# Patient Record
Sex: Female | Born: 1945
Health system: Southern US, Community
[De-identification: ages and names within clinical notes are randomized; demographics above are authoritative.]

## PROBLEM LIST (undated history)

## (undated) DIAGNOSIS — C8330 Diffuse large B-cell lymphoma, unspecified site: Principal | ICD-10-CM

## (undated) DIAGNOSIS — M1990 Unspecified osteoarthritis, unspecified site: Secondary | ICD-10-CM

## (undated) DIAGNOSIS — IMO0001 Reserved for inherently not codable concepts without codable children: Secondary | ICD-10-CM

## (undated) DIAGNOSIS — H9191 Unspecified hearing loss, right ear: Secondary | ICD-10-CM

## (undated) DIAGNOSIS — Z973 Presence of spectacles and contact lenses: Secondary | ICD-10-CM

## (undated) DIAGNOSIS — Z972 Presence of dental prosthetic device (complete) (partial): Secondary | ICD-10-CM

## (undated) DIAGNOSIS — IMO0002 Reserved for concepts with insufficient information to code with codable children: Secondary | ICD-10-CM

## (undated) HISTORY — PX: COLONOSCOPY: SHX174

## (undated) HISTORY — DX: Reserved for concepts with insufficient information to code with codable children: IMO0002

## (undated) HISTORY — PX: TONSILLECTOMY: SUR1361

## (undated) HISTORY — DX: Reserved for inherently not codable concepts without codable children: IMO0001

## (undated) HISTORY — PX: APPENDECTOMY: SHX54

## (undated) HISTORY — PX: ABDOMINAL HYSTERECTOMY: SHX81

## (undated) HISTORY — DX: Diffuse large B-cell lymphoma, unspecified site: C83.30

---

## 1998-02-08 ENCOUNTER — Ambulatory Visit (HOSPITAL_COMMUNITY): Admission: RE | Admit: 1998-02-08 | Discharge: 1998-02-08 | Payer: Self-pay | Admitting: *Deleted

## 1998-06-14 ENCOUNTER — Other Ambulatory Visit: Admission: RE | Admit: 1998-06-14 | Discharge: 1998-06-14 | Payer: Self-pay | Admitting: *Deleted

## 1999-05-13 ENCOUNTER — Ambulatory Visit (HOSPITAL_COMMUNITY): Admission: RE | Admit: 1999-05-13 | Discharge: 1999-05-13 | Payer: Self-pay | Admitting: *Deleted

## 1999-07-01 ENCOUNTER — Other Ambulatory Visit: Admission: RE | Admit: 1999-07-01 | Discharge: 1999-07-01 | Payer: Self-pay | Admitting: *Deleted

## 2000-05-19 ENCOUNTER — Ambulatory Visit (HOSPITAL_COMMUNITY): Admission: RE | Admit: 2000-05-19 | Discharge: 2000-05-19 | Payer: Self-pay | Admitting: *Deleted

## 2000-05-19 ENCOUNTER — Encounter: Payer: Self-pay | Admitting: *Deleted

## 2000-07-24 ENCOUNTER — Ambulatory Visit (HOSPITAL_COMMUNITY): Admission: RE | Admit: 2000-07-24 | Discharge: 2000-07-24 | Payer: Self-pay | Admitting: Gastroenterology

## 2000-07-24 ENCOUNTER — Encounter: Payer: Self-pay | Admitting: Gastroenterology

## 2000-08-10 ENCOUNTER — Other Ambulatory Visit: Admission: RE | Admit: 2000-08-10 | Discharge: 2000-08-10 | Payer: Self-pay | Admitting: *Deleted

## 2001-06-07 ENCOUNTER — Encounter: Payer: Self-pay | Admitting: *Deleted

## 2001-06-07 ENCOUNTER — Ambulatory Visit (HOSPITAL_COMMUNITY): Admission: RE | Admit: 2001-06-07 | Discharge: 2001-06-07 | Payer: Self-pay | Admitting: *Deleted

## 2001-06-16 ENCOUNTER — Encounter: Admission: RE | Admit: 2001-06-16 | Discharge: 2001-06-16 | Payer: Self-pay | Admitting: Internal Medicine

## 2001-06-16 ENCOUNTER — Encounter: Payer: Self-pay | Admitting: Internal Medicine

## 2001-08-09 ENCOUNTER — Other Ambulatory Visit: Admission: RE | Admit: 2001-08-09 | Discharge: 2001-08-09 | Payer: Self-pay | Admitting: *Deleted

## 2002-06-21 ENCOUNTER — Ambulatory Visit (HOSPITAL_COMMUNITY): Admission: RE | Admit: 2002-06-21 | Discharge: 2002-06-21 | Payer: Self-pay | Admitting: *Deleted

## 2002-06-21 ENCOUNTER — Encounter: Payer: Self-pay | Admitting: *Deleted

## 2002-08-29 ENCOUNTER — Other Ambulatory Visit: Admission: RE | Admit: 2002-08-29 | Discharge: 2002-08-29 | Payer: Self-pay | Admitting: *Deleted

## 2003-07-03 ENCOUNTER — Ambulatory Visit (HOSPITAL_COMMUNITY): Admission: RE | Admit: 2003-07-03 | Discharge: 2003-07-03 | Payer: Self-pay | Admitting: *Deleted

## 2003-07-03 ENCOUNTER — Encounter: Payer: Self-pay | Admitting: *Deleted

## 2003-08-03 ENCOUNTER — Ambulatory Visit (HOSPITAL_COMMUNITY): Admission: RE | Admit: 2003-08-03 | Discharge: 2003-08-03 | Payer: Self-pay | Admitting: Gastroenterology

## 2003-09-06 ENCOUNTER — Other Ambulatory Visit: Admission: RE | Admit: 2003-09-06 | Discharge: 2003-09-06 | Payer: Self-pay | Admitting: *Deleted

## 2004-07-08 ENCOUNTER — Ambulatory Visit (HOSPITAL_COMMUNITY): Admission: RE | Admit: 2004-07-08 | Discharge: 2004-07-08 | Payer: Self-pay | Admitting: *Deleted

## 2004-09-16 ENCOUNTER — Other Ambulatory Visit: Admission: RE | Admit: 2004-09-16 | Discharge: 2004-09-16 | Payer: Self-pay | Admitting: *Deleted

## 2005-07-09 ENCOUNTER — Ambulatory Visit (HOSPITAL_COMMUNITY): Admission: RE | Admit: 2005-07-09 | Discharge: 2005-07-09 | Payer: Self-pay | Admitting: *Deleted

## 2005-09-25 ENCOUNTER — Other Ambulatory Visit: Admission: RE | Admit: 2005-09-25 | Discharge: 2005-09-25 | Payer: Self-pay | Admitting: *Deleted

## 2006-03-10 ENCOUNTER — Encounter: Admission: RE | Admit: 2006-03-10 | Discharge: 2006-03-10 | Payer: Self-pay | Admitting: Internal Medicine

## 2006-07-16 ENCOUNTER — Ambulatory Visit (HOSPITAL_COMMUNITY): Admission: RE | Admit: 2006-07-16 | Discharge: 2006-07-16 | Payer: Self-pay | Admitting: *Deleted

## 2006-10-05 ENCOUNTER — Other Ambulatory Visit: Admission: RE | Admit: 2006-10-05 | Discharge: 2006-10-05 | Payer: Self-pay | Admitting: *Deleted

## 2007-07-19 ENCOUNTER — Ambulatory Visit (HOSPITAL_COMMUNITY): Admission: RE | Admit: 2007-07-19 | Discharge: 2007-07-19 | Payer: Self-pay | Admitting: *Deleted

## 2007-08-13 ENCOUNTER — Emergency Department (HOSPITAL_COMMUNITY): Admission: EM | Admit: 2007-08-13 | Discharge: 2007-08-13 | Payer: Self-pay | Admitting: Emergency Medicine

## 2007-10-27 ENCOUNTER — Other Ambulatory Visit: Admission: RE | Admit: 2007-10-27 | Discharge: 2007-10-27 | Payer: Self-pay | Admitting: *Deleted

## 2008-02-07 ENCOUNTER — Ambulatory Visit: Payer: Self-pay | Admitting: Cardiology

## 2008-07-27 ENCOUNTER — Ambulatory Visit (HOSPITAL_COMMUNITY): Admission: RE | Admit: 2008-07-27 | Discharge: 2008-07-27 | Payer: Self-pay | Admitting: *Deleted

## 2008-11-13 ENCOUNTER — Other Ambulatory Visit: Admission: RE | Admit: 2008-11-13 | Discharge: 2008-11-13 | Payer: Self-pay | Admitting: Gynecology

## 2009-03-02 ENCOUNTER — Encounter: Admission: RE | Admit: 2009-03-02 | Discharge: 2009-03-02 | Payer: Self-pay | Admitting: Surgical Oncology

## 2009-04-24 ENCOUNTER — Emergency Department (HOSPITAL_COMMUNITY): Admission: EM | Admit: 2009-04-24 | Discharge: 2009-04-24 | Payer: Self-pay | Admitting: Emergency Medicine

## 2009-08-08 ENCOUNTER — Ambulatory Visit (HOSPITAL_COMMUNITY): Admission: RE | Admit: 2009-08-08 | Discharge: 2009-08-08 | Payer: Self-pay | Admitting: Gynecology

## 2011-05-06 NOTE — Assessment & Plan Note (Signed)
HEALTHCARE                            CARDIOLOGY OFFICE NOTE   NAME:NOBLEErion, Hermans                         MRN:          [id number]  DATE:02/07/2008                            DOB:          Nov 11, 1946    REASON FOR CONSULTATION:  Evaluate patient with bradycardia.   HISTORY OF PRESENT ILLNESS:  The patient is a lovely 65 year old African  American female who looks much younger than her stated age.  She was  seen by Dr. Nash Shearer for questionable syncope versus seizure.  She was at  the nursing home and went down to the ground.  However, she says that  there was water on the floor and that she fell.  She says she did not  lose consciousness.  She knew what was happening.  There was no seizure  activity.  She did not initially have neuro evaluation with a  questionably abnormal EEG.  However, there apparently was no significant  finding and follow-up.  She did apparently have some brady arrhythmia  which I assume was at the time of the EEG.  He had sinus rhythm in the  40s.  I do not have those strips.   The patient is quite active.  She exercises 6 days a week aerobically.  With this, she denies any chest discomfort, neck or arm discomfort.  She  does not have any shortness of breath, PND or orthopnea.  She says she  never felt any palpitations.  She has never had any pre-syncope or  syncope before.   PAST MEDICAL HISTORY:  She has no history of hypertension, diabetes,  hyperlipidemia.   PAST SURGICAL HISTORY:  1. Hysterectomy.  2. Bunionectomy.   ALLERGIES:  BIAXIN, ERYTHROMYCIN, HYDROCODONE.   MEDICATIONS:  Premarin, over-the-counter iron.   SOCIAL HISTORY:  The patient smoked briefly 30 years ago.  She is  married.  She has adult children and  one 90-month-old grandchild.  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   FAMILY HISTORY:  Noncontributory for early coronary artery disease,  sudden cardiac death, syncope,  palpitations or cardiomyopathy.   REVIEW OF SYSTEMS:  As stated in the HPI and positive for glasses,  partial dentures, occasional constipation.  Negative for other systems.   PHYSICAL EXAMINATION:  The patient is in no distress.  Blood pressure  118/70, heart rate 67 and regular, body mass index 28.  HEENT: Eyelids unremarkable. Pupils equal, round, and reactive to light.  Fundi within normal limits. Oral mucosa is unremarkable.  NECK: No jugular venous distention at 45 degrees. Carotid upstroke brisk  and symmetrical. No bruits, no thyromegaly.  LYMPHATICS: No cervical, axillary or inguinal adenopathy.  LUNGS: Clear to auscultation bilaterally.  BACK: No costovertebral angle tenderness.  CHEST: Unremarkable.  HEART: PMI not displaced or sustained. S1, S2 within normal limits. No  S3. No S4. No clicks, rub or murmurs.  ABDOMEN: Flat, positive bowel sounds, normal in frequency and pitch. No  bruits. No rebounds. No guarding. No midline pulsatile mass. No  hepatomegaly, splenomegaly.  SKIN: No rashes, no nodules.  EXTREMITIES:  2+ pulses throughout. No edema. No cyanosis or clubbing.  NEURO: Oriented to person, place and time. Cranial nerves II-XII grossly  intact. Motor grossly intact.   EKG:  Sinus rhythm, rate 65, axis within normal limits, intervals within  normal limits, no acute ST-T wave changes.   ASSESSMENT/PLAN:  1. Bradycardia.  The patient had bradycardia noted.  However, she has      not any symptoms related to this.  She has a heart rate in the 60s      today and describes an increased heart rate with activities.  She      is very aerobically fit, which may explain a brady arrhythmia.      There was no real suggestion of syncope.  Therefore, at this point      no further cardiovascular testing is suggested.  2. Follow-up will be back in this clinic as needed.     Rollene Rotunda, MD, [facility]  Electronically Signed    JH/MedQ  DD: 02/07/2008  DT: 02/07/2008  Job #:  [id number]   cc:   Bevelyn Buckles. Nash Shearer, M.D.

## 2011-05-09 NOTE — Op Note (Signed)
   NAME:  Tabitha Jackson, Tabitha Jackson                            ACCOUNT NO.:  [id number]   MEDICAL RECORD NO.:  [id number]                   PATIENT TYPE:  AMB   LOCATION:  ENDO                                 FACILITY:  [facility]   PHYSICIAN:  Petra Kuba, M.D.                 DATE OF BIRTH:  August 22, 1946   DATE OF PROCEDURE:  08/03/2003  DATE OF DISCHARGE:                                 OPERATIVE REPORT   PROCEDURE:  Colonoscopy.   INDICATION:  Family history of colon cancer, due for repeat screening.  Consent was signed after risks, benefits, methods, options thoroughly  discussed multiple times in the past.   MEDICINES USED:  1. Demerol 60.  2. Versed 6.   DESCRIPTION OF PROCEDURE:  Rectal inspection was negative.  Digital exam was  negative.  Video pediatric adjustable colonoscope was inserted and with mild  difficulty due to tortuosity and looping, was able to be advanced to the  cecum which required rolling her on her back, various abdominal pressures.  No obvious abnormality was seen on insertion.  The cecum was identified by  the appendiceal orifice and the ileocecal valve.  The scope was slowly  withdrawn.  The prep was adequate.  There was some liquid stool that  required washing and suctioning but on slow withdrawal through the colon, no  abnormalities were seen.  When we fell back around a tortuous loop, we did  try to readvance around the curves to decrease chances of missing things.  Anorectal pull-through and retroflexion was normal in the rectum.  Really  did not appreciate any hemorrhoids.  Scope was reinserted a short ways up  the left side of the colon; air was suctioned, scope removed.  The patient  tolerated the procedure well.  There was no obvious immediate complication.   ENDOSCOPIC DIAGNOSIS:  Essentially normal exam to the cecum except for  tortuosity.   PLAN:  1. Yearly rectals and guaiacs per Marcy Salvo C. Lendell Caprice, M.D.  2. Happy to see back p.r.n.  3. Otherwise  repeat screening in five years.                                               Petra Kuba, M.D.    MEM/MEDQ  D:  08/03/2003  T:  08/03/2003  Job:  [id number]   cc:   Janae Bridgeman. Eloise Harman., M.D.  [address]  [place]  [place] [address]  Fax: [phone]

## 2011-10-03 LAB — COMPREHENSIVE METABOLIC PANEL
ALT: 18
AST: 27
Albumin: 3.8
Alkaline Phosphatase: 61
BUN: 9
CO2: 27
Calcium: 9.2
Chloride: 103
Creatinine, Ser: 0.83
GFR calc Af Amer: >60
GFR calc non Af Amer: >60
Glucose, Bld: 75
Potassium: 3.4 — ABNORMAL LOW
Sodium: 138
Total Bilirubin: 0.6
Total Protein: 7.2

## 2011-10-03 LAB — DIFFERENTIAL
Basophils Absolute: 0
Basophils Relative: 0
Eosinophils Absolute: 0.1
Eosinophils Relative: 2
Lymphocytes Relative: 44
Lymphs Abs: 2.1
Monocytes Absolute: 0.2
Monocytes Relative: 4
Neutro Abs: 2.3
Neutrophils Relative %: 49

## 2011-10-03 LAB — CBC
HCT: 39.1
Hemoglobin: 13
MCHC: 33.3
MCV: 88.3
Platelets: 183
RBC: 4.43
RDW: 13.9
WBC: 4.7

## 2013-12-22 HISTORY — PX: EYE SURGERY: SHX253

## 2014-12-17 ENCOUNTER — Encounter: Payer: Self-pay | Admitting: Emergency Medicine

## 2014-12-17 ENCOUNTER — Ambulatory Visit (INDEPENDENT_AMBULATORY_CARE_PROVIDER_SITE_OTHER): Payer: Medicare Other

## 2014-12-17 VITALS — BP 122/68 | HR 56 | Temp 98.0°F | Resp 17 | Ht 65.5 in | Wt 143.0 lb

## 2014-12-17 DIAGNOSIS — R05 Cough: Secondary | ICD-10-CM

## 2014-12-17 DIAGNOSIS — R059 Cough, unspecified: Secondary | ICD-10-CM

## 2014-12-17 DIAGNOSIS — J189 Pneumonia, unspecified organism: Secondary | ICD-10-CM

## 2014-12-17 LAB — POCT CBC
Granulocyte percent: 41.7 %G (ref 37–80)
HCT, POC: 37.6 % — AB (ref 37.7–47.9)
Hemoglobin: 12 g/dL — AB (ref 12.2–16.2)
Lymph, poc: 1.5 (ref 0.6–3.4)
MCH, POC: 28.8 pg (ref 27–31.2)
MCHC: 31.9 g/dL (ref 31.8–35.4)
MCV: 90.2 fL (ref 80–97)
MID (cbc): 0.3 (ref 0–0.9)
MPV: 8.5 fL (ref 0–99.8)
POC Granulocyte: 1.3 — AB (ref 2–6.9)
POC LYMPH PERCENT: 48.4 %L (ref 10–50)
POC MID %: 9.9 %M (ref 0–12)
Platelet Count, POC: 170 10*3/uL (ref 142–424)
RBC: 4.17 M/uL (ref 4.04–5.48)
RDW, POC: 13.9 %
WBC: 3.1 10*3/uL — AB (ref 4.6–10.2)

## 2014-12-17 LAB — POCT INFLUENZA A/B
Influenza A, POC: NEGATIVE
Influenza B, POC: NEGATIVE

## 2014-12-17 MED ORDER — LEVOFLOXACIN 500 MG PO TABS: 500.0000 mg | ORAL_TABLET | Freq: Every day | ORAL | Status: AC

## 2014-12-17 NOTE — Patient Instructions (Signed)

## 2014-12-17 NOTE — Progress Notes (Addendum)
Subjective:  This chart was scribed for Tabitha Jackson by Starleen Arms, Medical Scribe. This patient was seen in room Rm 11 and the patient's care was started at 8:45 AM.   Patient ID: Tabitha Jackson, female    DOB: 06-10-1946, 68 y.o.   MRN: [id number]  HPI HPI Comments: EMONY Jackson is a 68 y.o. female who presents to the [facility] complaining of a cough productive of thick, yellowish sputum in variable amounts onset 12/22 days ago.  She states the symptoms "started in my head and moved to my chest".  Patient has used green tea and alka seltzer.  Patient reports she has had a flu shot this year. Patient denies history of asthma, lung conditions.  Patient is a former smoker 30 years ago.  Patient denies fever, chills.  Patient reports an allergy to amicin, Biaxin, hydrocodone.    Review of Systems  Constitutional: Negative for fever and chills.  Respiratory: Positive for cough.        Objective:   Physical Exam  Constitutional: She is oriented to person, place, and time. She appears well-developed and well-nourished. No distress.  HENT:  Head: Normocephalic and atraumatic.  Clear nasal drainage.   Eyes: Conjunctivae and EOM are normal.  Neck: Neck supple. No tracheal deviation present.  Cardiovascular: Normal rate, regular rhythm and normal heart sounds.  Exam reveals no gallop and no friction rub.   No murmur heard. Pulmonary/Chest: Effort normal and breath sounds normal. No respiratory distress. She has no wheezes. She has no rales. She exhibits no tenderness.  CTA  Musculoskeletal: Normal range of motion.  Neurological: She is alert and oriented to person, place, and time.  Skin: Skin is warm and dry.  Psychiatric: She has a normal mood and affect. Her behavior is normal.  Nursing note and vitals reviewed.   8:50 AM Will order CXR and labs.  Patient acknowledges and agrees with plan.   Results for orders placed or performed in visit on 12/17/14  POCT CBC  Result Value Ref Range   WBC 3.1 (A) 4.6 - 10.2 K/uL   Lymph, poc 1.5 0.6 - 3.4   POC LYMPH PERCENT 48.4 10 - 50 %L   MID (cbc) 0.3 0 - 0.9   POC MID % 9.9 0 - 12 %M   POC Granulocyte 1.3 (A) 2 - 6.9   Granulocyte percent 41.7 37 - 80 %G   RBC 4.17 4.04 - 5.48 M/uL   Hemoglobin 12.0 (A) 12.2 - 16.2 g/dL   HCT, POC 37.6 (A) 37.7 - 47.9 %   MCV 90.2 80 - 97 fL   MCH, POC 28.8 27 - 31.2 pg   MCHC 31.9 31.8 - 35.4 g/dL   RDW, POC 13.9 %   Platelet Count, POC 170 142 - 424 K/uL   MPV 8.5 0 - 99.8 fL  [facility] reading (PRIMARY) by  Dr. Everlene Farrier there is a patchy right middle lobe infiltrate I received the radiologist report and he feels like the pneumonia is in the right lower lobe with consolidation Results for orders placed or performed in visit on 12/17/14  POCT CBC  Result Value Ref Range   WBC 3.1 (A) 4.6 - 10.2 K/uL   Lymph, poc 1.5 0.6 - 3.4   POC LYMPH PERCENT 48.4 10 - 50 %L   MID (cbc) 0.3 0 - 0.9   POC MID % 9.9 0 - 12 %M   POC Granulocyte 1.3 (A) 2 - 6.9   Granulocyte  percent 41.7 37 - 80 %G   RBC 4.17 4.04 - 5.48 M/uL   Hemoglobin 12.0 (A) 12.2 - 16.2 g/dL   HCT, POC 37.6 (A) 37.7 - 47.9 %   MCV 90.2 80 - 97 fL   MCH, POC 28.8 27 - 31.2 pg   MCHC 31.9 31.8 - 35.4 g/dL   RDW, POC 13.9 %   Platelet Count, POC 170 142 - 424 K/uL   MPV 8.5 0 - 99.8 fL  POCT Influenza A/B  Result Value Ref Range   Influenza A, POC Negative    Influenza B, POC Negative       Assessment & Plan:   Patient has a right lower lobe infiltrate. Will treat with levaquin  and Mucinex and fluids. She will need a follow-up film in 10 days to show clearing. Definitely this could be viral related since patient's white count is 3100. We'll recheck in  72 hours. She will return tomorrow if worsening otherwise recheck Wednesday. She will need a repeat white count since her white count is low at 3100

## 2014-12-20 ENCOUNTER — Ambulatory Visit (INDEPENDENT_AMBULATORY_CARE_PROVIDER_SITE_OTHER): Payer: Medicare Other | Admitting: Emergency Medicine

## 2014-12-20 ENCOUNTER — Ambulatory Visit (INDEPENDENT_AMBULATORY_CARE_PROVIDER_SITE_OTHER): Payer: Medicare Other

## 2014-12-20 ENCOUNTER — Encounter: Payer: Self-pay | Admitting: Emergency Medicine

## 2014-12-20 VITALS — BP 120/78 | HR 74 | Temp 97.7°F | Resp 18 | Ht 65.25 in | Wt 143.0 lb

## 2014-12-20 DIAGNOSIS — R05 Cough: Secondary | ICD-10-CM

## 2014-12-20 DIAGNOSIS — R059 Cough, unspecified: Secondary | ICD-10-CM

## 2014-12-20 DIAGNOSIS — J189 Pneumonia, unspecified organism: Secondary | ICD-10-CM

## 2014-12-20 LAB — POCT CBC
Granulocyte percent: 45.1 %G (ref 37–80)
HCT, POC: 39.9 % (ref 37.7–47.9)
Hemoglobin: 12.8 g/dL (ref 12.2–16.2)
Lymph, poc: 1.6 (ref 0.6–3.4)
MCH, POC: 28.7 pg (ref 27–31.2)
MCHC: 32.1 g/dL (ref 31.8–35.4)
MCV: 89.4 fL (ref 80–97)
MID (cbc): 0.2 (ref 0–0.9)
MPV: 8.7 fL (ref 0–99.8)
POC Granulocyte: 1.4 — AB (ref 2–6.9)
POC LYMPH PERCENT: 49 %L (ref 10–50)
POC MID %: 5.9 %M (ref 0–12)
Platelet Count, POC: 201 10*3/uL (ref 142–424)
RBC: 4.47 M/uL (ref 4.04–5.48)
RDW, POC: 13.5 %
WBC: 3.2 10*3/uL — AB (ref 4.6–10.2)

## 2014-12-20 NOTE — Patient Instructions (Signed)

## 2014-12-20 NOTE — Progress Notes (Addendum)
   Subjective:    Patient ID: Tabitha Jackson, female    DOB: 04/28/1946, 68 y.o.   MRN: [id number]  This chart was scribed for Jenny Reichmann, MD, by Stephania Fragmin, ED Scribe. This patient was seen in room 3 and the patient's care was started at 8:26 AM.   HPI  HPI Comments: Tabitha Jackson is a 68 y.o. female who presents to the [facility] for a recheck of pneumonia. Patient was seen here 3 days ago, where I diagnosed her with pneumonia and prescribed levofloxacin. She reports she has been taking her medication daily, and had plenty of rest and fluid. She states she has still been coughing, although her sputum has lightened from thick yellow towards a clearer white. She denies SOB. Patient thinks she may have caught her illness at the gym, as she swims 71 laps 6 days a week.    Review of Systems  Constitutional: Negative for fever.  Respiratory: Positive for cough. Negative for shortness of breath.   Psychiatric/Behavioral: Negative for sleep disturbance.       Objective:   Physical Exam  CONSTITUTIONAL: Well developed/well nourished HEAD: Normocephalic/atraumatic EYES: EOMI/PERRL ENMT: Mucous membranes moist NECK: supple no meningeal signs SPINE/BACK:entire spine nontender CV: S1/S2 noted, no murmurs/rubs/gallops noted LUNGS: Rhonchi noted in right lower lobe and left lingular area ABDOMEN: soft, nontender, no rebound or guarding, bowel sounds noted throughout abdomen GU:no cva tenderness NEURO: Pt is awake/alert/appropriate, moves all extremitiesx4.  No facial droop.   EXTREMITIES: pulses normal/equal, full ROM SKIN: warm, color normal PSYCH: no abnormalities of mood noted, alert and oriented to situation   Results for orders placed or performed in visit on 12/20/14  POCT CBC  Result Value Ref Range   WBC 3.2 (A) 4.6 - 10.2 K/uL   Lymph, poc 1.6 0.6 - 3.4   POC LYMPH PERCENT 49.0 10 - 50 %L   MID (cbc) 0.2 0 - 0.9   POC MID % 5.9 0 - 12 %M   POC Granulocyte  1.4 (A) 2 - 6.9   Granulocyte percent 45.1 37 - 80 %G   RBC 4.47 4.04 - 5.48 M/uL   Hemoglobin 12.8 12.2 - 16.2 g/dL   HCT, POC 39.9 37.7 - 47.9 %   MCV 89.4 80 - 97 fL   MCH, POC 28.7 27 - 31.2 pg   MCHC 32.1 31.8 - 35.4 g/dL   RDW, POC 13.5 %   Platelet Count, POC 201 142 - 424 K/uL   MPV 8.7 0 - 99.8 fL    [facility] reading (PRIMARY) by  Dr. Everlene Farrier there has been progressive consolidation in the right lower lobe.      Assessment & Plan:  Patient has progressive consolidation in the right lower lobe. Her white count remains low at 3200. She will continue her current antibiotics and recheck on Monday. She was giving the warning signs for progression to go to the ER she had problems. Otherwise recheck on Monday.  I personally performed the services described in this documentation, which was scribed in my presence. The recorded information has been reviewed and is accurate.

## 2014-12-25 ENCOUNTER — Ambulatory Visit (INDEPENDENT_AMBULATORY_CARE_PROVIDER_SITE_OTHER): Payer: Medicare Other

## 2014-12-25 ENCOUNTER — Ambulatory Visit (INDEPENDENT_AMBULATORY_CARE_PROVIDER_SITE_OTHER): Payer: Medicare Other | Admitting: Emergency Medicine

## 2014-12-25 VITALS — BP 118/62 | HR 65 | Temp 97.9°F | Resp 18 | Ht 66.25 in | Wt 144.2 lb

## 2014-12-25 DIAGNOSIS — J189 Pneumonia, unspecified organism: Secondary | ICD-10-CM

## 2014-12-25 DIAGNOSIS — R591 Generalized enlarged lymph nodes: Secondary | ICD-10-CM | POA: Diagnosis not present

## 2014-12-25 NOTE — Progress Notes (Signed)
Subjective:  This chart was scribed for Tabitha Jordan, MD by Dellis Filbert, ED Scribe at [facility].The patient was seen in exam room 05 and the patient's care was started at 8:38 AM.   Patient ID: Tabitha Jackson, female    DOB: 09/15/1946, 69 y.o.   MRN: [id number] Chief Complaint  Patient presents with  . Follow-up    for cough and pneumonia. Pt. says that she noticed a tender lump behind her right ear yesterday afternoon.     HPI HPI Comments: ALAYSSA FLINCHUM is a 69 y.o. female who presents to [facility] here for a follow up for pneumonia. Pt still has an ongoing cough that is productive but the sputum is more clear than yellow. She denies fever, chills, and loss of appetite. Pt is frustrated because she has been unable to swim. Pt also ooticed a lump under her right ear, onset yesterday. Her husband gave her a cough syrup with a hydrocodone. Pt is allergic to the to hydrocodone.  Patient Active Problem List   Diagnosis Date Noted  . CAP (community acquired pneumonia) 12/20/2014   History reviewed. No pertinent past medical history. Past Surgical History  Procedure Laterality Date  . Appendectomy    . Eye surgery    . Abdominal hysterectomy     Allergies  Allergen Reactions  . Erythromycin Swelling  . Hydrocodone Swelling  . Biaxin [Clarithromycin] Rash   Prior to Admission medications   Medication Sig Start Date End Date Taking? Authorizing Provider  Cinnamon 500 MG TABS Take by mouth.   Yes Historical Provider, MD  levofloxacin (LEVAQUIN) 500 MG tablet Take 1 tablet (500 mg total) by mouth daily. 12/17/14 12/27/14 Yes [name], MD  naproxen (NAPROSYN) 250 MG tablet Take by mouth 2 (two) times daily with a meal.   Yes Historical Provider, MD   History   Social History  . Marital Status: Married    Spouse Name: N/A    Number of Children: N/A  . Years of Education: N/A   Occupational History  . Not on file.   Social History Main Topics  . Smoking  status: Never Smoker   . Smokeless tobacco: Not on file  . Alcohol Use: No  . Drug Use: No  . Sexual Activity: No   Other Topics Concern  . Not on file   Social History Narrative   Review of Systems  Constitutional: Negative for fever, chills and appetite change.  Respiratory: Positive for cough.       Objective:  BP 118/62 mmHg  Pulse 65  Temp(Src) 97.9 F (36.6 C) (Oral)  Resp 18  Ht 5' 6.25" (1.683 m)  Wt 144 lb 3.2 oz (65.409 kg)  BMI 23.09 kg/m2  SpO2 98%  Physical Exam  Constitutional: She is oriented to person, place, and time. She appears well-developed and well-nourished. No distress.  HENT:  Head: Normocephalic and atraumatic.  Eyes: EOM are normal.  Neck: Normal range of motion.  1/1 cm posterior cervical node. 1/1 cm anterior cervical node.  Cardiovascular: Normal rate.   Pulmonary/Chest: Effort normal.  Consolidated right lower lobe pneumonia. Few faint rales right base.  Neurological: She is alert and oriented to person, place, and time. No cranial nerve deficit. She exhibits normal muscle tone. Coordination normal.  Skin: Skin is warm and dry.  Psychiatric: She has a normal mood and affect. Her behavior is normal.  Nursing note and vitals reviewed. [facility] reading (PRIMARY) by  Dr.Daub  there is a resolving infiltrate in the right lower lobe. This area appears to be about 50% clear.     Assessment & Plan:  Pt here for follow up for pneumonia will proceed with chest x-ray. Chest x-ray is improving. She is to finish her medications. I will allow her to start back to do some exercise at about 30% of what she normally does. We'll recheck the patient in 2 weeks. She needs to have the 2 nodes rechecked in her neck and a repeat chest x-ray.I personally performed the services described in this documentation, which was scribed in my presence. The recorded information has been reviewed and is accurate.

## 2014-12-26 DIAGNOSIS — R599 Enlarged lymph nodes, unspecified: Secondary | ICD-10-CM | POA: Diagnosis not present

## 2015-01-04 DIAGNOSIS — R221 Localized swelling, mass and lump, neck: Secondary | ICD-10-CM | POA: Diagnosis not present

## 2015-01-08 ENCOUNTER — Ambulatory Visit (INDEPENDENT_AMBULATORY_CARE_PROVIDER_SITE_OTHER): Payer: Medicare Other

## 2015-01-08 ENCOUNTER — Ambulatory Visit (INDEPENDENT_AMBULATORY_CARE_PROVIDER_SITE_OTHER): Payer: Medicare Other | Admitting: Emergency Medicine

## 2015-01-08 VITALS — BP 116/68 | HR 98 | Temp 98.0°F | Resp 18 | Ht 66.25 in | Wt 144.0 lb

## 2015-01-08 DIAGNOSIS — R591 Generalized enlarged lymph nodes: Secondary | ICD-10-CM

## 2015-01-08 DIAGNOSIS — J189 Pneumonia, unspecified organism: Secondary | ICD-10-CM

## 2015-01-08 LAB — POCT CBC
Granulocyte percent: 39.7 %G (ref 37–80)
HCT, POC: 39.2 % (ref 37.7–47.9)
Hemoglobin: 12.6 g/dL (ref 12.2–16.2)
Lymph, poc: 1.5 (ref 0.6–3.4)
MCH, POC: 28.5 pg (ref 27–31.2)
MCHC: 32.1 g/dL (ref 31.8–35.4)
MCV: 88.8 fL (ref 80–97)
MID (cbc): 0.2 (ref 0–0.9)
MPV: 8.8 fL (ref 0–99.8)
POC Granulocyte: 1.1 — AB (ref 2–6.9)
POC LYMPH PERCENT: 53.5 %L — AB (ref 10–50)
POC MID %: 6.8 %M (ref 0–12)
Platelet Count, POC: 174 10*3/uL (ref 142–424)
RBC: 4.41 M/uL (ref 4.04–5.48)
RDW, POC: 13.7 %
WBC: 2.8 10*3/uL — AB (ref 4.6–10.2)

## 2015-01-08 LAB — BASIC METABOLIC PANEL
BUN: 9 mg/dL (ref 6–23)
CO2: 32 mEq/L (ref 19–32)
Calcium: 9.6 mg/dL (ref 8.4–10.5)
Chloride: 101 mEq/L (ref 96–112)
Creat: 0.65 mg/dL (ref 0.50–1.10)
Glucose, Bld: 78 mg/dL (ref 70–99)
Potassium: 4.9 mEq/L (ref 3.5–5.3)
Sodium: 139 mEq/L (ref 135–145)

## 2015-01-08 LAB — POCT SEDIMENTATION RATE: POCT SED RATE: 27 mm/hr — AB (ref 0–22)

## 2015-01-08 NOTE — Progress Notes (Addendum)
   Subjective:    Patient ID: Tabitha Jackson, female    DOB: 02-07-1946, 68 y.o.   MRN: [id number] This chart was scribed for Arlyss Queen, MD by Marti Sleigh, Medical Scribe. This patient was seen in Room 1 and the patient's care was started at 9:33 AM.  Chief Complaint  Patient presents with  . Follow-up    pneumonia     HPI HPI Comments: Tabitha Jackson is a 70 y.o. female who presents to [facility] reporting for a pneumonia follow up. Pt has reported several times to [facility] for follow up since 12/22 when she reported for a severe productive cough with thick green yellow-green sputum. Pt states she is doing well. She endorses mild persistent cough. Pt states the lump on her throat has persisted, even as her other sx have resolved.     Review of Systems  Constitutional: Negative for fever and chills.  HENT: Positive for congestion (Mild).   Respiratory: Positive for cough (Mild).   Psychiatric/Behavioral: Negative for sleep disturbance.       Objective:   Physical Exam  Constitutional: She is oriented to person, place, and time. She appears well-developed and well-nourished.  HENT:  Head: Normocephalic and atraumatic.  Eyes: Pupils are equal, round, and reactive to light.  Neck: Neck supple.  Non distinct right posterior cervical node, and a 1/1.5cm right posterior cervical node.  Cardiovascular: Normal rate and regular rhythm.   Pulmonary/Chest: Effort normal and breath sounds normal. No respiratory distress. She has no wheezes.  Neurological: She is alert and oriented to person, place, and time.  Skin: Skin is warm and dry.  Psychiatric: She has a normal mood and affect. Her behavior is normal.  Nursing note and vitals reviewed. [facility] reading (PRIMARY) by  Dr. Everlene Farrier the previously noted right lower lobe pneumonia has now resolved.      Assessment & Plan:   I am very concerned about the two lymph nodes in her neck. They have not decreased in size with treatment of her pneumonia. She  will need evaluation by dr Benjamine Mola, ENT and imaging by CT. Will do a follow up chest x-ray today to check the pneumonia.I personally performed the services described in this documentation, which was scribed in my presence. The recorded information has been reviewed and is accurate.

## 2015-01-15 DIAGNOSIS — R590 Localized enlarged lymph nodes: Secondary | ICD-10-CM | POA: Diagnosis not present

## 2015-01-15 DIAGNOSIS — R070 Pain in throat: Secondary | ICD-10-CM | POA: Diagnosis not present

## 2015-01-15 DIAGNOSIS — R221 Localized swelling, mass and lump, neck: Secondary | ICD-10-CM | POA: Diagnosis not present

## 2015-01-30 DIAGNOSIS — D487 Neoplasm of uncertain behavior of other specified sites: Secondary | ICD-10-CM | POA: Diagnosis not present

## 2015-01-30 DIAGNOSIS — H6983 Other specified disorders of Eustachian tube, bilateral: Secondary | ICD-10-CM | POA: Diagnosis not present

## 2015-01-30 DIAGNOSIS — J310 Chronic rhinitis: Secondary | ICD-10-CM | POA: Diagnosis not present

## 2015-01-30 DIAGNOSIS — J343 Hypertrophy of nasal turbinates: Secondary | ICD-10-CM | POA: Diagnosis not present

## 2015-01-30 DIAGNOSIS — H6981 Other specified disorders of Eustachian tube, right ear: Secondary | ICD-10-CM | POA: Diagnosis not present

## 2015-01-30 DIAGNOSIS — H9011 Conductive hearing loss, unilateral, right ear, with unrestricted hearing on the contralateral side: Secondary | ICD-10-CM | POA: Diagnosis not present

## 2015-01-31 ENCOUNTER — Ambulatory Visit
Admission: RE | Admit: 2015-01-31 | Discharge: 2015-01-31 | Disposition: A | Discharge status: Home / Self Care | Payer: Medicare Other | Source: Ambulatory Visit | Attending: Emergency Medicine | Admitting: Emergency Medicine

## 2015-01-31 DIAGNOSIS — R591 Generalized enlarged lymph nodes: Secondary | ICD-10-CM

## 2015-01-31 DIAGNOSIS — C090 Malignant neoplasm of tonsillar fossa: Secondary | ICD-10-CM | POA: Diagnosis not present

## 2015-01-31 MED ORDER — IOHEXOL 300 MG/ML  SOLN
75.0000 mL | Freq: Once | INTRAMUSCULAR | Status: AC | PRN
  Administered 2015-01-31: 75 mL via INTRAVENOUS

## 2015-02-01 ENCOUNTER — Encounter: Payer: Self-pay | Admitting: Emergency Medicine

## 2015-02-01 ENCOUNTER — Ambulatory Visit (INDEPENDENT_AMBULATORY_CARE_PROVIDER_SITE_OTHER): Payer: Medicare Other | Admitting: Emergency Medicine

## 2015-02-01 VITALS — BP 110/66 | HR 84 | Temp 98.2°F | Resp 20 | Ht 65.5 in | Wt 143.6 lb

## 2015-02-01 DIAGNOSIS — R911 Solitary pulmonary nodule: Secondary | ICD-10-CM | POA: Diagnosis not present

## 2015-02-01 DIAGNOSIS — R221 Localized swelling, mass and lump, neck: Secondary | ICD-10-CM | POA: Diagnosis not present

## 2015-02-01 NOTE — Progress Notes (Signed)
Subjective:  This chart was scribed for Tabitha Russian, MD by Ian Bushman, ED Scribe. This patient was seen in room 23 and the patient's care was started at 12:32 PM.    Patient ID: Tabitha Jackson, female    DOB: Jul 22, 1946, 69 y.o.   MRN: [id number]  HPI  HPI Comments: Tabitha Jackson is a 69 y.o. female who presents [facility] for a follow up regarding her CT results of her tonsils/throat.  Patient notes that she will see Dr.Theo tomorrow at 1pm. Patient will have the blockage sensation in her ear looked at tomorrow. Dr. Benjamine Mola states that when he looked at her tonsils, he could not find anything visible on her tonsils. She will see Dr. Maudie Mercury will be seen for lab work on the 22nd of February.  Patient notes that she is a Tuberculosis carrier. Patient used to smoke over 30 years ago.  Patient notes that she is constantly active and exercizes.   Patient notes that her brother had lukemia.     Patient Active Problem List   Diagnosis Date Noted  . Lymphadenopathy 12/25/2014  . CAP (community acquired pneumonia) 12/20/2014   No past medical history on file. Past Surgical History  Procedure Laterality Date  . Appendectomy    . Eye surgery    . Abdominal hysterectomy     Allergies  Allergen Reactions  . Erythromycin Swelling  . Hydrocodone Swelling  . Biaxin [Clarithromycin] Rash   Prior to Admission medications   Medication Sig Start Date End Date Taking? Authorizing Provider  Cinnamon 500 MG TABS Take by mouth.    Historical Provider, MD  Ferrous Sulfate (IRON) 325 (65 FE) MG TABS Take by mouth.    Historical Provider, MD  naproxen (NAPROSYN) 250 MG tablet Take by mouth 2 (two) times daily with a meal.    Historical Provider, MD   History   Social History  . Marital Status: Married    Spouse Name: N/A  . Number of Children: N/A  . Years of Education: N/A   Occupational History  . Not on file.   Social History Main Topics  . Smoking status: Never Smoker     . Smokeless tobacco: Not on file  . Alcohol Use: No  . Drug Use: No  . Sexual Activity: No   Other Topics Concern  . Not on file   Social History Narrative    Review of Systems  Constitutional: Negative for activity change.  Respiratory: Negative for shortness of breath.   Cardiovascular: Negative for chest pain.       Objective:   Physical Exam CONSTITUTIONAL: Well developed/well nourished HEAD: Normocephalic/atraumatic EYES: EOMI/PERRL ENMT: Mucous membranes moist there are 31-2 cm nodes right posterior cervical chain. Examination of the throat reveals some enlargement on the right side of the tonsillar area compared to the left. Her chest was clear NECK: supple no meningeal signs SPINE/BACK:entire spine nontender CV: S1/S2 noted, no murmurs/rubs/gallops noted LUNGS: Lungs are clear to auscultation bilaterally, no apparent distress ABDOMEN: soft, nontender, no rebound or guarding, bowel sounds noted throughout abdomen GU:no cva tenderness NEURO: Pt is awake/alert/appropriate, moves all extremitiesx4.  No facial droop.   EXTREMITIES: pulses normal/equal, full ROM SKIN: warm, color normal PSYCH: no abnormalities of mood noted, alert and oriented to situation  Filed Vitals:   02/01/15 1226  BP: 110/66  Pulse: 84  Temp: 98.2 F (36.8 C)  TempSrc: Oral  Resp: 20  Height: 5' 5.5" (1.664 m)  Weight: 143 lb 9.6 oz (65.137 kg)  SpO2: 100%        Assessment & Plan:  We'll proceed with CT of the chest. She is scheduled to see Dr. Yvette Rack tomorrow to proceed with biopsy of this abnormal area.I personally performed the services described in this documentation, which was scribed in my presence. The recorded information has been reviewed and is accurate.

## 2015-02-02 ENCOUNTER — Other Ambulatory Visit: Payer: Self-pay | Admitting: Otolaryngology

## 2015-02-02 ENCOUNTER — Encounter (HOSPITAL_BASED_OUTPATIENT_CLINIC_OR_DEPARTMENT_OTHER): Payer: Self-pay | Admitting: *Deleted

## 2015-02-02 DIAGNOSIS — D37030 Neoplasm of uncertain behavior of the parotid salivary glands: Secondary | ICD-10-CM | POA: Diagnosis not present

## 2015-02-02 DIAGNOSIS — D487 Neoplasm of uncertain behavior of other specified sites: Secondary | ICD-10-CM | POA: Diagnosis not present

## 2015-02-02 NOTE — Progress Notes (Signed)
No labs needed

## 2015-02-05 ENCOUNTER — Ambulatory Visit (HOSPITAL_BASED_OUTPATIENT_CLINIC_OR_DEPARTMENT_OTHER)
Admission: RE | Admit: 2015-02-05 | Discharge: 2015-02-05 | Disposition: A | Discharge status: Home / Self Care | Payer: Medicare Other | Source: Ambulatory Visit | Attending: Otolaryngology | Admitting: Otolaryngology

## 2015-02-05 ENCOUNTER — Encounter (HOSPITAL_BASED_OUTPATIENT_CLINIC_OR_DEPARTMENT_OTHER): Payer: Self-pay

## 2015-02-05 ENCOUNTER — Ambulatory Visit (HOSPITAL_BASED_OUTPATIENT_CLINIC_OR_DEPARTMENT_OTHER): Payer: Medicare Other | Admitting: Anesthesiology

## 2015-02-05 ENCOUNTER — Encounter (HOSPITAL_BASED_OUTPATIENT_CLINIC_OR_DEPARTMENT_OTHER)
Admission: RE | Disposition: A | Discharge status: Home / Self Care | Payer: Self-pay | Source: Ambulatory Visit | Attending: Otolaryngology

## 2015-02-05 DIAGNOSIS — H748X1 Other specified disorders of right middle ear and mastoid: Secondary | ICD-10-CM | POA: Diagnosis not present

## 2015-02-05 DIAGNOSIS — H6981 Other specified disorders of Eustachian tube, right ear: Secondary | ICD-10-CM | POA: Diagnosis not present

## 2015-02-05 DIAGNOSIS — C8331 Diffuse large B-cell lymphoma, lymph nodes of head, face, and neck: Secondary | ICD-10-CM | POA: Diagnosis not present

## 2015-02-05 DIAGNOSIS — H6521 Chronic serous otitis media, right ear: Secondary | ICD-10-CM | POA: Diagnosis not present

## 2015-02-05 DIAGNOSIS — H9011 Conductive hearing loss, unilateral, right ear, with unrestricted hearing on the contralateral side: Secondary | ICD-10-CM | POA: Insufficient documentation

## 2015-02-05 DIAGNOSIS — R590 Localized enlarged lymph nodes: Secondary | ICD-10-CM | POA: Diagnosis not present

## 2015-02-05 DIAGNOSIS — R221 Localized swelling, mass and lump, neck: Secondary | ICD-10-CM | POA: Diagnosis not present

## 2015-02-05 HISTORY — PX: MYRINGOTOMY WITH TUBE PLACEMENT: SHX5663

## 2015-02-05 HISTORY — PX: MASS EXCISION: SHX2000

## 2015-02-05 HISTORY — DX: Presence of dental prosthetic device (complete) (partial): Z97.2

## 2015-02-05 HISTORY — DX: Unspecified osteoarthritis, unspecified site: M19.90

## 2015-02-05 HISTORY — DX: Unspecified hearing loss, right ear: H91.91

## 2015-02-05 HISTORY — DX: Presence of spectacles and contact lenses: Z97.3

## 2015-02-05 SURGERY — EXCISION MASS
Anesthesia: General | Site: Neck | Laterality: Right

## 2015-02-05 MED ORDER — HYDROMORPHONE HCL 1 MG/ML IJ SOLN
0.2500 mg | INTRAMUSCULAR | Status: DC | PRN
  Administered 2015-02-05: 0.25 mg via INTRAVENOUS
  Administered 2015-02-05: 0.5 mg via INTRAVENOUS

## 2015-02-05 MED ORDER — LACTATED RINGERS IV SOLN
INTRAVENOUS | Status: DC
  Administered 2015-02-05: 10 mL/h via INTRAVENOUS
  Administered 2015-02-05: 10:00:00 via INTRAVENOUS

## 2015-02-05 MED ORDER — DEXAMETHASONE SODIUM PHOSPHATE 4 MG/ML IJ SOLN
INTRAMUSCULAR | Status: DC | PRN
  Administered 2015-02-05: 10 mg via INTRAVENOUS

## 2015-02-05 MED ORDER — OXYCODONE-ACETAMINOPHEN 5-325 MG PO TABS: 1.0000 | ORAL_TABLET | ORAL | Status: DC | PRN

## 2015-02-05 MED ORDER — FENTANYL CITRATE 0.05 MG/ML IJ SOLN
INTRAMUSCULAR | Status: DC | PRN
  Administered 2015-02-05: 100 ug via INTRAVENOUS
  Administered 2015-02-05: 50 ug via INTRAVENOUS

## 2015-02-05 MED ORDER — MIDAZOLAM HCL 2 MG/2ML IJ SOLN
INTRAMUSCULAR | Status: AC
  Filled 2015-02-05: qty 2

## 2015-02-05 MED ORDER — ONDANSETRON HCL 4 MG/2ML IJ SOLN: 4.0000 mg | Freq: Once | INTRAMUSCULAR | Status: DC | PRN

## 2015-02-05 MED ORDER — OXYCODONE HCL 5 MG/5ML PO SOLN: 5.0000 mg | Freq: Once | ORAL | Status: DC | PRN

## 2015-02-05 MED ORDER — OXYCODONE HCL 5 MG PO TABS: 5.0000 mg | ORAL_TABLET | Freq: Once | ORAL | Status: DC | PRN

## 2015-02-05 MED ORDER — MIDAZOLAM HCL 5 MG/5ML IJ SOLN
INTRAMUSCULAR | Status: DC | PRN
  Administered 2015-02-05 (×2): 2 mg via INTRAVENOUS

## 2015-02-05 MED ORDER — FENTANYL CITRATE 0.05 MG/ML IJ SOLN: 50.0000 ug | INTRAMUSCULAR | Status: DC | PRN

## 2015-02-05 MED ORDER — OXYMETAZOLINE HCL 0.05 % NA SOLN
NASAL | Status: AC
  Filled 2015-02-05: qty 15

## 2015-02-05 MED ORDER — FENTANYL CITRATE 0.05 MG/ML IJ SOLN
INTRAMUSCULAR | Status: AC
  Filled 2015-02-05: qty 6

## 2015-02-05 MED ORDER — LIDOCAINE-EPINEPHRINE 1 %-1:100000 IJ SOLN
INTRAMUSCULAR | Status: DC | PRN
  Administered 2015-02-05: 1 mL

## 2015-02-05 MED ORDER — ONDANSETRON HCL 4 MG/2ML IJ SOLN
INTRAMUSCULAR | Status: DC | PRN
  Administered 2015-02-05: 4 mg via INTRAVENOUS

## 2015-02-05 MED ORDER — PROPOFOL 10 MG/ML IV BOLUS
INTRAVENOUS | Status: DC | PRN
  Administered 2015-02-05: 200 mg via INTRAVENOUS
  Administered 2015-02-05: 100 mg via INTRAVENOUS

## 2015-02-05 MED ORDER — LIDOCAINE-EPINEPHRINE 1 %-1:100000 IJ SOLN
INTRAMUSCULAR | Status: AC
  Filled 2015-02-05: qty 1

## 2015-02-05 MED ORDER — HYDROMORPHONE HCL 1 MG/ML IJ SOLN
INTRAMUSCULAR | Status: AC
  Filled 2015-02-05: qty 1

## 2015-02-05 MED ORDER — CIPROFLOXACIN-DEXAMETHASONE 0.3-0.1 % OT SUSP
OTIC | Status: DC | PRN
  Administered 2015-02-05: 4 [drp] via OTIC

## 2015-02-05 MED ORDER — FENTANYL CITRATE 0.05 MG/ML IJ SOLN
INTRAMUSCULAR | Status: AC
  Filled 2015-02-05: qty 4

## 2015-02-05 MED ORDER — MIDAZOLAM HCL 2 MG/2ML IJ SOLN: 1.0000 mg | INTRAMUSCULAR | Status: DC | PRN

## 2015-02-05 MED ORDER — AMOXICILLIN 875 MG PO TABS: 875.0000 mg | ORAL_TABLET | Freq: Two times a day (BID) | ORAL | Status: DC

## 2015-02-05 MED ORDER — CIPROFLOXACIN-DEXAMETHASONE 0.3-0.1 % OT SUSP
OTIC | Status: AC
  Filled 2015-02-05: qty 7.5

## 2015-02-05 MED ORDER — LIDOCAINE HCL (CARDIAC) 20 MG/ML IV SOLN
INTRAVENOUS | Status: DC | PRN
  Administered 2015-02-05 (×2): 50 mg via INTRAVENOUS

## 2015-02-05 SURGICAL SUPPLY — 59 items
ATTRACTOMAT 16X20 MAGNETIC DRP (DRAPES) IMPLANT
BALL CTTN LRG ABS STRL LF (GAUZE/BANDAGES/DRESSINGS) ×2
BLADE SURG 15 STRL LF DISP TIS (BLADE) IMPLANT
BLADE SURG 15 STRL SS (BLADE) ×3
CANISTER SUCT 1200ML W/VALVE (MISCELLANEOUS) ×3 IMPLANT
CORDS BIPOLAR (ELECTRODE) ×1 IMPLANT
COTTONBALL LRG STERILE PKG (GAUZE/BANDAGES/DRESSINGS) ×1 IMPLANT
COVER BACK TABLE 60X90IN (DRAPES) ×3 IMPLANT
COVER MAYO STAND STRL (DRAPES) ×3 IMPLANT
DECANTER SPIKE VIAL GLASS SM (MISCELLANEOUS) IMPLANT
DRAIN JACKSON RD 7FR 3/32 (WOUND CARE) IMPLANT
DRAIN PENROSE 1/4X12 LTX STRL (WOUND CARE) IMPLANT
DRAIN TLS ROUND 10FR (DRAIN) IMPLANT
DRAPE U-SHAPE 76X120 STRL (DRAPES) ×3 IMPLANT
ELECT COATED BLADE 2.86 ST (ELECTRODE) ×3 IMPLANT
ELECT NDL BLADE 2-5/6 (NEEDLE) IMPLANT
ELECT NEEDLE BLADE 2-5/6 (NEEDLE) IMPLANT
ELECT PAIRED SUBDERMAL (MISCELLANEOUS)
ELECT REM PT RETURN 9FT ADLT (ELECTROSURGICAL) ×3
ELECTRODE PAIRED SUBDERMAL (MISCELLANEOUS) IMPLANT
ELECTRODE REM PT RTRN 9FT ADLT (ELECTROSURGICAL) ×2 IMPLANT
EVACUATOR SILICONE 100CC (DRAIN) IMPLANT
GAUZE SPONGE 4X4 16PLY XRAY LF (GAUZE/BANDAGES/DRESSINGS) IMPLANT
GLOVE BIO SURGEON STRL SZ 6.5 (GLOVE) ×1 IMPLANT
GLOVE BIO SURGEON STRL SZ7.5 (GLOVE) ×3 IMPLANT
GLOVE SURG SS PI 7.0 STRL IVOR (GLOVE) ×1 IMPLANT
GOWN STRL REUS W/ TWL LRG LVL3 (GOWN DISPOSABLE) ×4 IMPLANT
GOWN STRL REUS W/TWL LRG LVL3 (GOWN DISPOSABLE) ×9
HEMOSTAT SURGICEL 2X14 (HEMOSTASIS) ×1 IMPLANT
LIQUID BAND (GAUZE/BANDAGES/DRESSINGS) ×3 IMPLANT
LOCATOR NERVE 3 VOLT (DISPOSABLE) IMPLANT
NDL HYPO 25X1 1.5 SAFETY (NEEDLE) ×2 IMPLANT
NEEDLE HYPO 25X1 1.5 SAFETY (NEEDLE) ×3 IMPLANT
NS IRRIG 1000ML POUR BTL (IV SOLUTION) ×3 IMPLANT
PACK BASIN DAY SURGERY FS (CUSTOM PROCEDURE TRAY) ×3 IMPLANT
PENCIL BUTTON HOLSTER BLD 10FT (ELECTRODE) ×3 IMPLANT
PIN SAFETY STERILE (MISCELLANEOUS) IMPLANT
PROBE NERVBE PRASS .33 (MISCELLANEOUS) IMPLANT
SET EXTENSION TUBING 8  CATH (SET/KITS/TRAYS/PACK) ×1 IMPLANT
SLEEVE SCD COMPRESS KNEE MED (MISCELLANEOUS) ×1 IMPLANT
SPONGE GAUZE 2X2 8PLY STRL LF (GAUZE/BANDAGES/DRESSINGS) IMPLANT
SPONGE GAUZE 4X4 12PLY STER LF (GAUZE/BANDAGES/DRESSINGS) IMPLANT
SUCTION FRAZIER TIP 10 FR DISP (SUCTIONS) IMPLANT
SUT ETHILON 3 0 PS 1 (SUTURE) IMPLANT
SUT ETHILON 5 0 P 3 18 (SUTURE)
SUT NYLON ETHILON 5-0 P-3 1X18 (SUTURE) IMPLANT
SUT PROLENE 4 0 P 3 18 (SUTURE) IMPLANT
SUT SILK 3 0 TIES 17X18 (SUTURE) ×3
SUT SILK 3-0 18XBRD TIE BLK (SUTURE) IMPLANT
SUT SILK 4 0 TIES 17X18 (SUTURE) IMPLANT
SUT VIC AB 3-0 FS2 27 (SUTURE) IMPLANT
SUT VIC AB 4-0 P-3 18XBRD (SUTURE) IMPLANT
SUT VIC AB 4-0 P3 18 (SUTURE)
SUT VICRYL 4-0 PS2 18IN ABS (SUTURE) ×3 IMPLANT
SYR BULB 3OZ (MISCELLANEOUS) ×3 IMPLANT
SYR CONTROL 10ML LL (SYRINGE) ×3 IMPLANT
TOWEL OR 17X24 6PK STRL BLUE (TOWEL DISPOSABLE) ×4 IMPLANT
TUBE CONNECTING 20X1/4 (TUBING) ×4 IMPLANT
YANKAUER SUCT BULB TIP NO VENT (SUCTIONS) IMPLANT

## 2015-02-05 NOTE — H&P (Signed)
Cc: Right neck mass, right middle ear effusion  HPI: The patient is a 69 year old female who presents today for evaluation of a clogging sensation in her right ear for the past week. The patient was last seen on 01/15/2015 following a diagnosis of pneumonia with onset of right sided lymphadenopathy. At that time, the patient was noted to have a right level V and level II lymphadenopathy. The lymph nodes measured 1 cm and were mobile without evidence of infection.  These were felt to likely be reactive in nature so observation was recommended. The patient noted acute onset of clogging in her right ear and associated hearing loss about one week ago.  She is already using Flonase daily for her chronic rhinitis.  The patient notes occasional right ear pain.  She is scheduled to undergo a neck CT tomorrow.  The patient has not noted any change in her right sided lymphadenopathy. No other ENT, GI, or respiratory issue noted since the last visit.   Exam: General: Communicates without difficulty, well nourished, no acute distress. Head: Normocephalic, no evidence injury, no tenderness, facial buttresses intact without stepoff. Eyes: PERRL, EOMI. No scleral icterus, conjunctivae clear. Neuro: CN II exam reveals vision grossly intact. No nystagmus at any point of gaze. Ears: Auricles well formed without lesions. Ear canals are intact without mass or lesion. No erythema or edema is appreciated. Both TMs are intact with fluid noted on the right. Nose: External evaluation reveals normal support and skin without lesions. Dorsum is intact. Anterior rhinoscopy reveals congested and edematous mucosa over anterior aspect of the inferior turbinates and nasal septum. No purulence is noted. Nasal septum: Leftward deviation but intact. Oral:  Oral cavity and oropharynx are intact, symmetric, without erythema or edema. Mucosa is moist without lesions. Neck: Full range of motion without pain. Right level V and level II lymph nodes  are noted. The lymph nodes are approximately 1cm in size. No erythema or drainage is noted. Thyroid bed within normal limits to palpation. Parotid glands and submandibular glands equal bilaterally without mass. Trachea is midline. Neuro:  CN 2-12 grossly intact. Gait normal. Vestibular: No nystagmus at any point of gaze.  AUDIOMETRIC TESTING:  Shows normal hearing on the left with conductive hearing loss noted on the right. The speech reception threshold is 30dB AD and 10dB AS. The discrimination score is 100% AD and 92% AS. The tympanogram is flat on the right and normal on the left.  Procedure:  Flexible Nasal Endoscopy: Risks, benefits, and alternatives of flexible endoscopy were explained to the patient.  Specific mention was made of the risk of throat numbness with difficulty swallowing, possible bleeding from the nose and mouth, and pain from the procedure.  The patient gave oral consent to proceed.  The nasal cavities were decongested and anesthetised with a combination of oxymetazoline and 4% lidocaine solution.  The flexible scope was inserted into the right nasal cavity.  Endoscopy of the inferior and middle meatus was performed.  The edematous mucosa was as described above.  No polyp, mass, or lesion was appreciated.  Olfactory cleft was clear.  Nasopharynx was clear.  Turbinates were hypertrophied but without mass.  Incomplete response to decongestion.  The procedure was repeated on the contralateral side with similar findings.  The pharynx and larynx were also noted to be normal without mucosal ulceration. The patient tolerated the procedure well.  Instructions were given to avoid eating or drinking for 2 hours.   Assessment 1.  The patient is noted to  have new onset of right middle ear effusion with associated conductive hearing loss.  2.  Right level V and level II lymphadenopathy is noted and unchanged but in light of her new right middle ear effusion is a bit more concerning.  3.  Chronic  rhinitis without evidence of acute sinusitis. No purulent drainage, polyps, or other suspicious mass or lesion is noted on today's nasal endoscopy. 4.  No pharyngeal or laryngeal mucosal ulceration or suspicious mass or lesion on endoscopy exam.  Plan  1.  Nasal endoscopy and the hearing test findings are reviewed with the patient. All questions and concerns are addressed. 2.  Treatment options include excisional biopsy of the right neck mass, conservative observation, and right myringotomy and tube placement. 3.  The patient would like to proceed with the myringotomy and biopsy procedures. The risks, benefits, and details of the procedures are reviewed. Questions are invited and answered. Informed consent is obtained.

## 2015-02-05 NOTE — Op Note (Signed)
DATE OF PROCEDURE:  02/05/2015                              OPERATIVE REPORT  SURGEON:  [name], MD  PREOPERATIVE DIAGNOSES: 1. Right neck and parapharyngeal masses 2. Right eustachian tube dysfunction. 3. Right middle ear effusion and conductive hearing loss.  POSTOPERATIVE DIAGNOSES: 1. Right neck and parapharyngeal masses 2. Right eustachian tube dysfunction. 3. Right middle ear effusion and conductive hearing loss.  PROCEDURE PERFORMED:  1. Open biopsy of the right neck mass. (CPT Q6372415) 2. Right myringotomy and tube placement        ANESTHESIA:  General laryngeal mask anesthesia.  COMPLICATIONS:  None.  ESTIMATED BLOOD LOSS:  Minimal.  INDICATION FOR PROCEDURE:   Tabitha Jackson is a 69 y.o. who was recently seen for multiple right neck masses. The patient subsequently developed right middle ear effusion and conductive hearing loss. On her CT scan, she was noted to have a large right parapharyngeal mass, compressing on the eustachian tube. Based on the above findings, the decision was made for the patient to undergo the above-stated procedures. The risks, benefits, alternatives, and details of the procedure were discussed with the mother.  Questions were invited and answered.  Informed consent was obtained.  DESCRIPTION:  The patient was taken to the operating room and placed supine on the operating table.  General laryngeal mask anesthesia was induced by the anesthesiologist.  The patient was positioned and prepped and draped in a standard fashion for right ear surgery. Under the operating microscope, the right ear canal was cleaned of all cerumen. The tympanic membrane was noted to be intact but mildly retracted. A standard myringotomy incision was made on the right anterior inferior quadrant of the tympanic membrane. A copious amount of serous fluid was suctioned from behind the tympanic membrane. A Sheehy collar button tube was placed, followed by antibiotic eardrops in the ear  canal.  The patient was then repositioned for open biopsy of her right neck masses. 1% lidocaine with 1-100,000 epinephrine was infiltrated around the incision site. A transverse incision was made over her right level II neck mass. The incision was carried down to the level of the platysma muscles. Subplatysmal flaps were elevated in a standard fashion. The sternocleidomastoid muscle was then retracted laterally, exposing a soft tissue mass. A large segment of the mass was then excised and sent to the pathology department for permanent histologic identification. The surgical site was copiously irrigated. Hemostasis was achieved with bipolar electrocautery. A piece of Surgicel was also placed. The incision was closed in layers with 4-0 Vicryl and Dermabond.  The care of the patient was turned over to the anesthesiologist.  The patient was awakened from anesthesia without difficulty.  The patient was extubated and transferred to the recovery room in good condition.  OPERATIVE FINDINGS:  Right middle ear effusion. Multiple right neck masses.  SPECIMEN:  Open biopsy specimen of one of the right neck masses.  FOLLOWUP CARE:  The patient will be placed on Ciprodex eardrops 4 drops each ear b.i.d. for 3 days.  The patient will be placed on Percocet when necessary for pain. The patient will follow up in my office in 1 week.  Tabitha Jackson 02/05/2015

## 2015-02-05 NOTE — Anesthesia Postprocedure Evaluation (Signed)
  Anesthesia Post-op Note  Patient: Tabitha Jackson  Procedure(s) Performed: Procedure(s): EXCISIONAL BIOPSY OF RIGHT NECK MASS (Right) MYRINGOTOMY WITH TUBE PLACEMENT (Right)  Patient Location: PACU  Anesthesia Type: General   Level of Consciousness: awake, alert  and oriented  Airway and Oxygen Therapy: Patient Spontanous Breathing  Post-op Pain: mild  Post-op Assessment: Post-op Vital signs reviewed  Post-op Vital Signs: Reviewed  Last Vitals:  Filed Vitals:   02/05/15 1230  BP: 142/106  Pulse: 50  Temp: 34.2 C  Resp: 17    Complications: No apparent anesthesia complications

## 2015-02-05 NOTE — Transfer of Care (Signed)
Immediate Anesthesia Transfer of Care Note  Patient: Tabitha Jackson  Procedure(s) Performed: Procedure(s): EXCISIONAL BIOPSY OF RIGHT NECK MASS (Right) MYRINGOTOMY WITH TUBE PLACEMENT (Right)  Patient Location: PACU  Anesthesia Type:General  Level of Consciousness: awake, sedated and patient cooperative  Airway & Oxygen Therapy: Patient Spontanous Breathing and Patient connected to face mask oxygen  Post-op Assessment: Report given to RN and Post -op Vital signs reviewed and stable  Post vital signs: Reviewed and stable  Last Vitals:  Filed Vitals:   02/05/15 0949  BP: 129/70  Pulse: 56  Temp: 36.6 C  Resp: 18    Complications: No apparent anesthesia complications

## 2015-02-05 NOTE — Discharge Instructions (Addendum)
POSTOPERATIVE INSTRUCTIONS FOR PATIENTS HAVING MYRINGOTOMY AND TUBES  1. Please use the ear drops in each ear with a new tube for the next  3 days.  Use the drops as prescribed by your doctor, placing the drops into the outer opening of the ear canal with the head tilted to the opposite side. Place a clean piece of cotton into the ear after using drops. A small amount of blood tinged drainage is not uncommon for several days after the tubes are inserted. 2. Nausea and vomiting may be expected the first 6 hours after surgery. Offer liquids initially. If there is no nausea, small light meals are usually best tolerated the day of surgery. A normal diet may be resumed once nausea has passed. 3. The patient may experience mild ear discomfort the day of surgery, which is usually relieved by Tylenol. 4. A small amount of clear or blood-tinged drainage from the ears may occur a few days after surgery. If this should persists or become thick, green, yellow, or foul smelling, please contact our office at [phone]. 5. If you see clear, green, or yellow drainage from your childs ear during colds, clean the outer ear gently with a soft, damp washcloth. Begin the prescribed ear drops (4 drops, twice a day) for one week, as previously instructed.  The drainage should stop within 48 hours after starting the ear drops. If the drainage continues or becomes yellow or green, please call our office. If your child develops a fever greater than 102 F, or has and persistent bleeding from the ear(s), please call us. 6. Try to avoid getting water in the ears. Swimming is permitted as long as there is no deep diving or swimming under water deeper than 3 feet. If you think water has gotten into the ear(s), either bathing or swimming, place 4 drops of the prescribed ear drops into the ear in question. We do recommend drops after swimming in the ocean, rivers, or lakes. 7. It is important for you to return for your scheduled  appointment so that the status of the tubes can be determined.     Post Anesthesia Home Care Instructions  Activity: Get plenty of rest for the remainder of the day. A responsible adult should stay with you for 24 hours following the procedure.  For the next 24 hours, DO NOT: -Drive a car -Paediatric nurse -Drink alcoholic beverages -Take any medication unless instructed by your physician -Make any legal decisions or sign important papers.  Meals: Start with liquid foods such as gelatin or soup. Progress to regular foods as tolerated. Avoid greasy, spicy, heavy foods. If nausea and/or vomiting occur, drink only clear liquids until the nausea and/or vomiting subsides. Call your physician if vomiting continues.  Special Instructions/Symptoms: Your throat may feel dry or sore from the anesthesia or the breathing tube placed in your throat during surgery. If this causes discomfort, gargle with warm salt water. The discomfort should disappear within 24 hours.

## 2015-02-05 NOTE — Anesthesia Preprocedure Evaluation (Addendum)
Anesthesia Evaluation  Patient identified by MRN, date of birth, ID band Patient awake    Reviewed: Allergy & Precautions, NPO status , Patient's Chart, lab work & pertinent test results  Airway Mallampati: I  TM Distance: >3 FB Neck ROM: Full    Dental  (+) Teeth Intact, Dental Advisory Given   Pulmonary former smoker,  breath sounds clear to auscultation        Cardiovascular Rhythm:Regular Rate:Normal     Neuro/Psych    GI/Hepatic   Endo/Other    Renal/GU      Musculoskeletal   Abdominal   Peds  Hematology   Anesthesia Other Findings   Reproductive/Obstetrics                            Anesthesia Physical Anesthesia Plan  ASA: II  Anesthesia Plan: General   Post-op Pain Management:    Induction: Intravenous  Airway Management Planned: LMA  Additional Equipment:   Intra-op Plan:   Post-operative Plan: Extubation in OR  Informed Consent: I have reviewed the patients History and Physical, chart, labs and discussed the procedure including the risks, benefits and alternatives for the proposed anesthesia with the patient or authorized representative who has indicated his/her understanding and acceptance.   Dental advisory given  Plan Discussed with: CRNA, Anesthesiologist and Surgeon  Anesthesia Plan Comments:         Anesthesia Quick Evaluation

## 2015-02-05 NOTE — Anesthesia Procedure Notes (Signed)
Procedure Name: LMA Insertion Date/Time: 02/05/2015 11:05 AM Performed by: Lieutenant Diego Pre-anesthesia Checklist: Patient identified, Emergency Drugs available, Suction available and Patient being monitored Patient Re-evaluated:Patient Re-evaluated prior to inductionOxygen Delivery Method: Circle System Utilized Preoxygenation: Pre-oxygenation with 100% oxygen Intubation Type: IV induction Ventilation: Mask ventilation without difficulty LMA: LMA inserted LMA Size: 4.0 Number of attempts: 1 Airway Equipment and Method: Bite block Placement Confirmation: positive ETCO2 and breath sounds checked- equal and bilateral Tube secured with: Tape Dental Injury: Teeth and Oropharynx as per pre-operative assessment

## 2015-02-06 ENCOUNTER — Encounter (HOSPITAL_BASED_OUTPATIENT_CLINIC_OR_DEPARTMENT_OTHER): Payer: Self-pay | Admitting: Otolaryngology

## 2015-02-09 ENCOUNTER — Other Ambulatory Visit: Payer: Medicare Other

## 2015-02-12 DIAGNOSIS — R739 Hyperglycemia, unspecified: Secondary | ICD-10-CM | POA: Diagnosis not present

## 2015-02-12 DIAGNOSIS — R7309 Other abnormal glucose: Secondary | ICD-10-CM | POA: Diagnosis not present

## 2015-02-12 DIAGNOSIS — Z0000 Encounter for general adult medical examination without abnormal findings: Secondary | ICD-10-CM | POA: Diagnosis not present

## 2015-02-12 DIAGNOSIS — R21 Rash and other nonspecific skin eruption: Secondary | ICD-10-CM | POA: Diagnosis not present

## 2015-02-12 DIAGNOSIS — C8591 Non-Hodgkin lymphoma, unspecified, lymph nodes of head, face, and neck: Secondary | ICD-10-CM | POA: Diagnosis not present

## 2015-02-13 ENCOUNTER — Telehealth: Payer: Self-pay | Admitting: Hematology and Oncology

## 2015-02-13 NOTE — Telephone Encounter (Signed)
pt confirmed appt on 02/15/15 at 11 w/ Gorsuch Dx: mass on neck Referring Dr. Benjamine Mola

## 2015-02-15 ENCOUNTER — Encounter: Payer: Self-pay | Admitting: Hematology and Oncology

## 2015-02-15 ENCOUNTER — Telehealth: Payer: Self-pay | Admitting: *Deleted

## 2015-02-15 ENCOUNTER — Encounter: Payer: Self-pay | Admitting: *Deleted

## 2015-02-15 ENCOUNTER — Telehealth: Payer: Self-pay | Admitting: Hematology and Oncology

## 2015-02-15 ENCOUNTER — Ambulatory Visit (HOSPITAL_BASED_OUTPATIENT_CLINIC_OR_DEPARTMENT_OTHER): Payer: Medicare Other | Admitting: Hematology and Oncology

## 2015-02-15 VITALS — BP 112/83 | HR 57 | Temp 98.2°F | Resp 18 | Ht 65.5 in | Wt 144.4 lb

## 2015-02-15 DIAGNOSIS — Z8572 Personal history of non-Hodgkin lymphomas: Secondary | ICD-10-CM | POA: Insufficient documentation

## 2015-02-15 DIAGNOSIS — C8330 Diffuse large B-cell lymphoma, unspecified site: Secondary | ICD-10-CM | POA: Diagnosis not present

## 2015-02-15 DIAGNOSIS — R918 Other nonspecific abnormal finding of lung field: Secondary | ICD-10-CM | POA: Diagnosis not present

## 2015-02-15 HISTORY — DX: Diffuse large B-cell lymphoma, unspecified site: C83.30

## 2015-02-15 NOTE — Progress Notes (Signed)
[name] NOTE  Patient Care Team: Kennon Rounds, MD as PCP - General (Internal Medicine)  CHIEF COMPLAINTS/PURPOSE OF CONSULTATION:  Newly diagnosed diffuse large B-cell lymphoma  HISTORY OF PRESENTING ILLNESS:  Tabitha Jackson 69 y.o. female is here because of recent diagnosis of lymphoma. The patient have chronic night sweats for several years which she attributed to stopping hormone replacement therapy. Around December 2015, the patient was found to have pneumonia with abnormal chest x-ray. She was prescribed levofloxacin. Setting around January 2016, she felt new onset of lymphadenopathy on the right side of the neck. She was prescribed another course of amoxicillin with no improvement. She also complained of right ear fullness and hearing loss. She was subsequently referred to ENT for evaluation. I review her records extensively and summarized as follows:   Diffuse large B cell lymphoma   01/31/2015 Imaging CT scan of the neck showed large mass centered around the right parapharyngeal space, measuring approximately 3.6 cm with bulky lymphadenopathy on the right side. Incidentally 2 nodules were found.   02/05/2015 Pathology Results Accession: [id number] biopsy confirmed diffuse large B-cell lymphoma   02/05/2015 Surgery She had excisional lymph node biopsy of the right neck mass   02/15/2015 Initial Diagnosis Diffuse large B cell lymphoma   She denies significant pain after surgery. Her hearing has improved since the ENT placed tubes in the right ear. She denies lymphadenopathy elsewhere. She had no anorexia, weight loss or recurrent infection since recently.  MEDICAL HISTORY:  Past Medical History  Diagnosis Date  . Arthritis   . Wears partial dentures   . Wears glasses   . Decreased hearing of right ear   . Diffuse large B cell lymphoma 02/15/2015    SURGICAL HISTORY: Past Surgical History  Procedure Laterality Date  . Appendectomy    . Abdominal  hysterectomy    . Eye surgery  2015    cataract left  . Tonsillectomy    . Colonoscopy    . Mass excision Right 02/05/2015    Procedure: EXCISIONAL BIOPSY OF RIGHT NECK MASS;  Surgeon: Ascencion Dike, MD;  Location: [facility];  Service: ENT;  Laterality: Right;  Marland Kitchen Myringotomy with tube placement Right 02/05/2015    Procedure: MYRINGOTOMY WITH TUBE PLACEMENT;  Surgeon: Ascencion Dike, MD;  Location: [facility];  Service: ENT;  Laterality: Right;    SOCIAL HISTORY: History   Social History  . Marital Status: Married    Spouse Name: N/A  . Number of Children: N/A  . Years of Education: N/A   Occupational History  . Not on file.   Social History Main Topics  . Smoking status: Former Smoker    Quit date: 02/02/1974  . Smokeless tobacco: Never Used  . Alcohol Use: No  . Drug Use: No  . Sexual Activity: No   Other Topics Concern  . Not on file   Social History Narrative    FAMILY HISTORY: Family History  Problem Relation Age of Onset  . Heart disease Mother   . Hypertension Mother   . Cancer Brother     leukemia  . Cancer Brother     colon ca    ALLERGIES:  is allergic to erythromycin; hydrocodone; peanut-containing drug products; and biaxin.  MEDICATIONS:  Current Outpatient Prescriptions  Medication Sig Dispense Refill  . carboxymethylcellulose 1 % ophthalmic solution 1 drop 3 (three) times daily. Refresh    . Cinnamon 500 MG TABS Take 1,000 mg  by mouth 2 (two) times daily.     . Ferrous Sulfate (IRON) 325 (65 FE) MG TABS Take by mouth.    . fluticasone (FLONASE) 50 MCG/ACT nasal spray Place 1 spray into both nostrils daily.    Marland Kitchen oxyCODONE-acetaminophen (ROXICET) 5-325 MG per tablet Take 1 tablet by mouth every 4 (four) hours as needed for moderate pain or severe pain. (Patient not taking: Reported on 02/15/2015) 20 tablet 0   No current facility-administered medications for this visit.    REVIEW OF SYSTEMS:   Constitutional: Denies  fevers, chills or abnormal night sweats Eyes: Denies blurriness of vision, double vision or watery eyes Ears, nose, mouth, throat, and face: Denies mucositis or sore throat Respiratory: Denies cough, dyspnea or wheezes Cardiovascular: Denies palpitation, chest discomfort or lower extremity swelling Gastrointestinal:  Denies nausea, heartburn or change in bowel habits Skin: Denies abnormal skin rashes Neurological:Denies numbness, tingling or new weaknesses Behavioral/Psych: Mood is stable, no new changes  All other systems were reviewed with the patient and are negative.  PHYSICAL EXAMINATION: ECOG PERFORMANCE STATUS: 0 - Asymptomatic  Filed Vitals:   02/15/15 1056  BP: 112/83  Pulse: 57  Temp: 98.2 F (36.8 C)  Resp: 18   Filed Weights   02/15/15 1056  Weight: 144 lb 6.4 oz (65.499 kg)    GENERAL:alert, no distress and comfortable SKIN: skin color, texture, turgor are normal, no rashes or significant lesions EYES: normal, conjunctiva are pink and non-injected, sclera clear OROPHARYNX:no exudate, no erythema and lips, buccal mucosa, and tongue normal . Note a fullness in the right tonsil region. NECK: supple, thyroid normal size, non-tender, without nodularity LYMPH:  She have a large mass on the right side of the neck. She also has a small lymphadenopathy further down along the cervical lymph node region. No lymphadenopathy elsewhere. LUNGS: clear to auscultation and percussion with normal breathing effort HEART: regular rate & rhythm and no murmurs and no lower extremity edema ABDOMEN:abdomen soft, non-tender and normal bowel sounds Musculoskeletal:no cyanosis of digits and no clubbing  PSYCH: alert & oriented x 3 with fluent speech NEURO: no focal motor/sensory deficits  LABORATORY DATA:  I have reviewed the data as listed Lab Results  Component Value Date   WBC 2.8* 01/08/2015   HGB 12.6 01/08/2015   HCT 39.2 01/08/2015   MCV 88.8 01/08/2015   PLT 183 08/13/2007     Recent Labs  01/08/15 0946  NA 139  K 4.9  CL 101  CO2 32  GLUCOSE 78  BUN 9  CREATININE 0.65  CALCIUM 9.6    RADIOGRAPHIC STUDIES: I have personally reviewed the radiological images as listed and agreed with the findings in the report. Ct Soft Tissue Neck W Contrast  01/31/2015   CLINICAL DATA:  Right neck adenopathy  EXAM: CT NECK WITH CONTRAST  TECHNIQUE: Multidetector CT imaging of the neck was performed using the standard protocol following the bolus administration of intravenous contrast.  CONTRAST:  39m OMNIPAQUE IOHEXOL 300 MG/ML  SOLN  COMPARISON:  None.  FINDINGS: Pharynx and larynx: Large mass centered in the right parapharyngeal space. The parapharyngeal fat is obliterated. The mass measures approximately 3.6 cm and extends superiorly towards the nasopharynx. The epicenter appears to be in the right tonsil. This is most consistent with carcinoma. Left tonsil is normal. Airway is patent. Epiglottis and larynx are normal.  Salivary glands: Negative  Thyroid: Small left thyroid nodules measuring 5 mm.  Lymph nodes: Bulky adenopathy in the right neck. Right level 2  known node 17 mm. Right posterior superior lymph node measures 20 mm. Right posterior mid cervical lymph node measures 25 x 22 mm. Right posterior lymph node at the level of the hyoid measures 11 mm. Right lower posterior lymph node measures 10 x 19 mm.  Left-sided level 2 lymph node measures 8 mm. This is likely reactive. No pathologic adenopathy in the left neck.  Vascular: Carotid and jugular are patent bilaterally without stenosis.  Limited intracranial: Negative  Visualized orbits: Negative  Mastoids and visualized paranasal sinuses: Right mastoid sinus effusion. Advanced degenerative change in the right TMJ. Mild mucosal edema right maxillary sinus and ethmoid sinus.  Skeleton: No destructive bony lesion identified. Negative for fracture.  Upper chest: Lung nodules the right apex measuring 4 mm and 6 mm. Dedicated  chest CT suggested for further evaluation.  IMPRESSION: Large mass in the right parapharyngeal space most consistent with tonsillar carcinoma. There is metastatic adenopathy throughout the right neck with bulky right sided adenopathy. No enlarged lymph nodes on the left.  Small lung nodules right apex, worrisome for metastatic disease. Dedicated chest CT recommended for further evaluation.   Electronically Signed   By: Franchot Gallo M.D.   On: 01/31/2015 15:13    ASSESSMENT & PLAN:  Multiple lung nodules on CT This was seen on recent imaging study. It could be related to recent pneumonia. I will clarify that further with PET CT scan. The patient is currently asymptomatic.   Diffuse large B cell lymphoma I have a long discussion with the patient and family. We discussed the importance of staging PET/CT scan, echocardiogram, blood work, bone marrow biopsy and chemotherapy education class. I would also recommend port placement. I will discuss her case at the next hematology tumor board next week to review everything. The standard treatment for this kind of lymphoma would be R-CHOP chemotherapy. I also discussed about the role of second opinion. Currently, we have a clinical trial available randomizing patients to R-CHOP versus lenalidomide with R-CHOP. She will qualify if she have advanced stage III/IV disease. I have referred her to meet with my research nurse today. We will regroup next week to review all test results with plan to start her on chemotherapy within the next 2 week, if possible. I addressed all questions and concerns.    All questions were answered. The patient knows to call the clinic with any problems, questions or concerns. I spent 55 minutes counseling the patient face to face. The total time spent in the appointment was 60 minutes and more than 50% was on counseling.     [facility], [place], MD 02/15/2015 1:58 PM

## 2015-02-15 NOTE — Telephone Encounter (Signed)
Confirm appointment echo for 02/26.

## 2015-02-15 NOTE — Assessment & Plan Note (Signed)
This was seen on recent imaging study. It could be related to recent pneumonia. I will clarify that further with PET CT scan. The patient is currently asymptomatic.

## 2015-02-15 NOTE — Assessment & Plan Note (Signed)
I have a long discussion with the patient and family. We discussed the importance of staging PET/CT scan, echocardiogram, blood work, bone marrow biopsy and chemotherapy education class. I would also recommend port placement. I will discuss her case at the next hematology tumor board next week to review everything. The standard treatment for this kind of lymphoma would be R-CHOP chemotherapy. I also discussed about the role of second opinion. Currently, we have a clinical trial available randomizing patients to R-CHOP versus lenalidomide with R-CHOP. She will qualify if she have advanced stage III/IV disease. I have referred her to meet with my research nurse today. We will regroup next week to review all test results with plan to start her on chemotherapy within the next 2 week, if possible. I addressed all questions and concerns.

## 2015-02-15 NOTE — Telephone Encounter (Signed)
Per staff message and POF I have scheduled appts. Advised scheduler of appts. JMW  

## 2015-02-15 NOTE — Telephone Encounter (Signed)
Gave avs & calendar for February/March. Sent message to get auth. Sent message to schedule treatment.

## 2015-02-16 ENCOUNTER — Telehealth: Payer: Self-pay | Admitting: Hematology and Oncology

## 2015-02-16 ENCOUNTER — Other Ambulatory Visit: Payer: Medicare Other

## 2015-02-16 ENCOUNTER — Ambulatory Visit (HOSPITAL_COMMUNITY)
Admission: RE | Admit: 2015-02-16 | Discharge: 2015-02-16 | Disposition: A | Discharge status: Home / Self Care | Payer: Medicare Other | Source: Ambulatory Visit | Attending: Hematology and Oncology | Admitting: Hematology and Oncology

## 2015-02-16 DIAGNOSIS — Z5111 Encounter for antineoplastic chemotherapy: Secondary | ICD-10-CM | POA: Diagnosis not present

## 2015-02-16 DIAGNOSIS — Z01818 Encounter for other preprocedural examination: Secondary | ICD-10-CM | POA: Insufficient documentation

## 2015-02-16 DIAGNOSIS — C8330 Diffuse large B-cell lymphoma, unspecified site: Secondary | ICD-10-CM | POA: Insufficient documentation

## 2015-02-16 NOTE — Progress Notes (Signed)
  Echocardiogram 2D Echocardiogram has been performed.  Diamond Nickel 02/16/2015, 12:33 PM

## 2015-02-16 NOTE — Telephone Encounter (Signed)
returned call and lvm to confirm appts

## 2015-02-19 ENCOUNTER — Encounter: Payer: Self-pay | Admitting: *Deleted

## 2015-02-19 ENCOUNTER — Ambulatory Visit (HOSPITAL_COMMUNITY)
Admission: RE | Admit: 2015-02-19 | Discharge: 2015-02-19 | Disposition: A | Discharge status: Home / Self Care | Payer: Medicare Other | Source: Ambulatory Visit | Attending: Hematology and Oncology | Admitting: Hematology and Oncology

## 2015-02-19 ENCOUNTER — Encounter (HOSPITAL_COMMUNITY): Payer: Self-pay

## 2015-02-19 VITALS — BP 116/71 | HR 72 | Temp 97.4°F | Resp 18 | Ht 65.0 in | Wt 144.0 lb

## 2015-02-19 DIAGNOSIS — C8330 Diffuse large B-cell lymphoma, unspecified site: Secondary | ICD-10-CM | POA: Diagnosis not present

## 2015-02-19 DIAGNOSIS — D72819 Decreased white blood cell count, unspecified: Secondary | ICD-10-CM | POA: Diagnosis not present

## 2015-02-19 LAB — CBC WITH DIFFERENTIAL/PLATELET
Basophils Absolute: 0 10*3/uL (ref 0.0–0.1)
Basophils Relative: 1 % (ref 0–1)
Eosinophils Absolute: 0.1 10*3/uL (ref 0.0–0.7)
Eosinophils Relative: 3 % (ref 0–5)
HCT: 37.3 % (ref 36.0–46.0)
Hemoglobin: 12.3 g/dL (ref 12.0–15.0)
Lymphocytes Relative: 50 % — ABNORMAL HIGH (ref 12–46)
Lymphs Abs: 1.3 10*3/uL (ref 0.7–4.0)
MCH: 29.4 pg (ref 26.0–34.0)
MCHC: 33 g/dL (ref 30.0–36.0)
MCV: 89 fL (ref 78.0–100.0)
Monocytes Absolute: 0.2 10*3/uL (ref 0.1–1.0)
Monocytes Relative: 9 % (ref 3–12)
Neutro Abs: 0.9 10*3/uL — ABNORMAL LOW (ref 1.7–7.7)
Neutrophils Relative %: 37 % — ABNORMAL LOW (ref 43–77)
Platelets: 222 10*3/uL (ref 150–400)
RBC: 4.19 MIL/uL (ref 3.87–5.11)
RDW: 13.6 % (ref 11.5–15.5)
WBC: 2.5 10*3/uL — ABNORMAL LOW (ref 4.0–10.5)

## 2015-02-19 LAB — COMPREHENSIVE METABOLIC PANEL
ALT: 19 U/L (ref 0–35)
AST: 24 U/L (ref 0–37)
Albumin: 3.9 g/dL (ref 3.5–5.2)
Alkaline Phosphatase: 55 U/L (ref 39–117)
Anion gap: 8 (ref 5–15)
BUN: 15 mg/dL (ref 6–23)
CO2: 26 mmol/L (ref 19–32)
Calcium: 9.1 mg/dL (ref 8.4–10.5)
Chloride: 106 mmol/L (ref 96–112)
Creatinine, Ser: 0.62 mg/dL (ref 0.50–1.10)
GFR calc Af Amer: >90 mL/min (ref >90)
GFR calc non Af Amer: >90 mL/min (ref >90)
Glucose, Bld: 82 mg/dL (ref 70–99)
Potassium: 4.7 mmol/L (ref 3.5–5.1)
Sodium: 140 mmol/L (ref 135–145)
Total Bilirubin: 0.5 mg/dL (ref 0.3–1.2)
Total Protein: 6.9 g/dL (ref 6.0–8.3)

## 2015-02-19 LAB — LACTATE DEHYDROGENASE: LDH: 217 U/L (ref 94–250)

## 2015-02-19 LAB — BONE MARROW EXAM

## 2015-02-19 LAB — HEPATITIS B SURFACE ANTIGEN: Hepatitis B Surface Ag: NEGATIVE

## 2015-02-19 LAB — URIC ACID: Uric Acid, Serum: 5.2 mg/dL (ref 2.4–7.0)

## 2015-02-19 MED ORDER — FENTANYL CITRATE 0.05 MG/ML IJ SOLN
100.0000 ug | Freq: Once | INTRAMUSCULAR | Status: AC
  Administered 2015-02-19: 12.5 ug via INTRAVENOUS
  Filled 2015-02-19: qty 2

## 2015-02-19 MED ORDER — MIDAZOLAM HCL 10 MG/2ML IJ SOLN
10.0000 mg | Freq: Once | INTRAMUSCULAR | Status: AC
  Administered 2015-02-19: 5 mg via INTRAVENOUS
  Filled 2015-02-19: qty 2

## 2015-02-19 MED ORDER — SODIUM CHLORIDE 0.9 % IV SOLN
INTRAVENOUS | Status: DC
  Administered 2015-02-19: 500 mL via INTRAVENOUS

## 2015-02-19 NOTE — Sedation Documentation (Signed)
Patient denies pain and is resting comfortably.  

## 2015-02-19 NOTE — Sedation Documentation (Signed)
Vital signs stable. 

## 2015-02-19 NOTE — Sedation Documentation (Signed)
Patient is resting comfortably. 

## 2015-02-19 NOTE — Sedation Documentation (Signed)
cdi dsg 

## 2015-02-19 NOTE — Discharge Instructions (Signed)
Do not drive  For 24 hours Do not go into public places today May resume your regular diet and take home medications as usual May experience small amount of tingling in leg (biopsy side) May take shower and remove bandage in am For any questions or concerns, call dr If bleeding occurs at site, hold pressure x10 minutes  If continues, call doctorBone Marrow Aspiration, Bone Marrow Biopsy Care After Read the instructions outlined below and refer to this sheet in the next few weeks. These discharge instructions provide you with general information on caring for yourself after you leave the hospital. Your caregiver may also give you specific instructions. While your treatment has been planned according to the most current medical practices available, unavoidable complications occasionally occur. If you have any problems or questions after discharge, call your caregiver. FINDING OUT THE RESULTS OF YOUR TEST Not all test results are available during your visit. If your test results are not back during the visit, make an appointment with your caregiver to find out the results. Do not assume everything is normal if you have not heard from your caregiver or the medical facility. It is important for you to follow up on all of your test results.  HOME CARE INSTRUCTIONS  You have had sedation and may be sleepy or dizzy. Your thinking may not be as clear as usual. For the next 24 hours:  Only take over-the-counter or prescription medicines for pain, discomfort, and or fever as directed by your caregiver.  Do not drink alcohol.  Do not smoke.  Do not drive.  Do not make important legal decisions.  Do not operate heavy machinery.  Do not care for small children by yourself.  Keep your dressing clean and dry. You may replace dressing with a bandage after 24 hours.  You may take a bath or shower after 24 hours.  Use an ice pack for 20 minutes every 2 hours while awake for pain as needed. SEEK MEDICAL  CARE IF:   There is redness, swelling, or increasing pain at the biopsy site.  There is pus coming from the biopsy site.  There is drainage from a biopsy site lasting longer than one day.  An unexplained oral temperature above 102 F (38.9 C) develops. SEEK IMMEDIATE MEDICAL CARE IF:   You develop a rash.  You have difficulty breathing.  You develop any reaction or side effects to medications given. Document Released: 06/27/2005 Document Revised: 03/01/2012 Document Reviewed: 12/05/2008 [facility] Patient Information 2015 [place], [place]. This information is not intended to replace advice given to you by your health care provider. Make sure you discuss any questions you have with your health care provider.

## 2015-02-19 NOTE — Sedation Documentation (Signed)
Family updated as to patient's status.

## 2015-02-19 NOTE — Sedation Documentation (Signed)
Medication dose calculated and verified for: Tabitha Jackson  12.53mcg fentanyl/5mg  versed

## 2015-02-19 NOTE — Sedation Documentation (Signed)
dsg cdi 

## 2015-02-19 NOTE — Procedures (Signed)
Brief examination was performed. ENT: adequate airway clearance Heart: regular rate and rhythm.No Murmurs Lungs: clear to auscultation, no wheezes, normal respiratory effort  American Society of Anesthesiologists ASA scale 1  Mallampati Score of 2  Bone Marrow Biopsy and Aspiration Procedure Note   Informed consent was obtained and potential risks including bleeding, infection and pain were reviewed with the patient. I verified that the patient has been fasting since midnight.  The patient's name, date of birth, identification, consent and allergies were verified prior to the start of procedure and time out was performed.  A total of 5 mg of IV Versed and 12.5 mcg of IV fentanyl were given.  The right posterior iliac crest was chosen as the site of biopsy.  The skin was prepped with Betadine solution.   8 cc of 1% lidocaine was used to provide local anaesthesia.   10 cc of bone marrow aspirate was obtained followed by 1 inch biopsy.   The procedure was tolerated well and there were no complications.  The patient was stable at the end of the procedure.  Specimens sent for flow cytometry, cytogenetics and additional studies.

## 2015-02-19 NOTE — Sedation Documentation (Signed)
dsg  Applied rt hip by md

## 2015-02-19 NOTE — Sedation Documentation (Signed)
dsg cdi/ pt dressed with  assist

## 2015-02-19 NOTE — Sedation Documentation (Signed)
dsg applied per md/cdi

## 2015-02-20 LAB — HEPATITIS B CORE ANTIBODY, TOTAL: Hep B Core Total Ab: NEGATIVE

## 2015-02-20 LAB — HEPATITIS B SURFACE ANTIBODY,QUALITATIVE: Hep B S Ab: NONREACTIVE

## 2015-02-21 ENCOUNTER — Ambulatory Visit: Payer: Medicare Other

## 2015-02-21 ENCOUNTER — Ambulatory Visit (HOSPITAL_COMMUNITY)
Admission: RE | Admit: 2015-02-21 | Discharge: 2015-02-21 | Disposition: A | Discharge status: Home / Self Care | Payer: Medicare Other | Source: Ambulatory Visit | Attending: Hematology and Oncology | Admitting: Hematology and Oncology

## 2015-02-21 ENCOUNTER — Encounter: Payer: Self-pay | Admitting: Medical Oncology

## 2015-02-21 DIAGNOSIS — C8330 Diffuse large B-cell lymphoma, unspecified site: Secondary | ICD-10-CM

## 2015-02-21 DIAGNOSIS — C8331 Diffuse large B-cell lymphoma, lymph nodes of head, face, and neck: Secondary | ICD-10-CM | POA: Diagnosis not present

## 2015-02-21 DIAGNOSIS — R918 Other nonspecific abnormal finding of lung field: Secondary | ICD-10-CM | POA: Diagnosis not present

## 2015-02-21 DIAGNOSIS — C8191 Hodgkin lymphoma, unspecified, lymph nodes of head, face, and neck: Secondary | ICD-10-CM | POA: Diagnosis not present

## 2015-02-21 DIAGNOSIS — C8591 Non-Hodgkin lymphoma, unspecified, lymph nodes of head, face, and neck: Secondary | ICD-10-CM | POA: Diagnosis not present

## 2015-02-21 LAB — GLUCOSE, CAPILLARY: Glucose-Capillary: 76 mg/dL (ref 70–99)

## 2015-02-21 MED ORDER — FLUDEOXYGLUCOSE F - 18 (FDG) INJECTION
7.8100 | Freq: Once | INTRAVENOUS | Status: AC | PRN
  Administered 2015-02-21: 7.81 via INTRAVENOUS

## 2015-02-22 ENCOUNTER — Other Ambulatory Visit: Payer: Self-pay | Admitting: Radiology

## 2015-02-23 ENCOUNTER — Telehealth: Payer: Self-pay | Admitting: Hematology and Oncology

## 2015-02-23 ENCOUNTER — Ambulatory Visit (HOSPITAL_BASED_OUTPATIENT_CLINIC_OR_DEPARTMENT_OTHER): Payer: Medicare Other | Admitting: Hematology and Oncology

## 2015-02-23 ENCOUNTER — Other Ambulatory Visit: Payer: Self-pay | Admitting: Radiology

## 2015-02-23 ENCOUNTER — Encounter: Payer: Self-pay | Admitting: Hematology and Oncology

## 2015-02-23 VITALS — BP 115/71 | HR 60 | Temp 98.0°F | Resp 19 | Ht 65.0 in | Wt 147.1 lb

## 2015-02-23 DIAGNOSIS — D72819 Decreased white blood cell count, unspecified: Secondary | ICD-10-CM

## 2015-02-23 DIAGNOSIS — C8331 Diffuse large B-cell lymphoma, lymph nodes of head, face, and neck: Secondary | ICD-10-CM | POA: Diagnosis not present

## 2015-02-23 DIAGNOSIS — T451X5A Adverse effect of antineoplastic and immunosuppressive drugs, initial encounter: Secondary | ICD-10-CM

## 2015-02-23 DIAGNOSIS — C8330 Diffuse large B-cell lymphoma, unspecified site: Secondary | ICD-10-CM

## 2015-02-23 DIAGNOSIS — D701 Agranulocytosis secondary to cancer chemotherapy: Secondary | ICD-10-CM | POA: Insufficient documentation

## 2015-02-23 MED ORDER — LIDOCAINE-PRILOCAINE 2.5-2.5 % EX CREA: TOPICAL_CREAM | CUTANEOUS | Status: DC

## 2015-02-23 MED ORDER — PREDNISONE 50 MG PO TABS: 30.0000 mg/m2 | ORAL_TABLET | Freq: Every day | ORAL | Status: DC

## 2015-02-23 MED ORDER — ONDANSETRON HCL 8 MG PO TABS: 8.0000 mg | ORAL_TABLET | Freq: Three times a day (TID) | ORAL | Status: DC | PRN

## 2015-02-23 MED ORDER — PROCHLORPERAZINE MALEATE 10 MG PO TABS: 10.0000 mg | ORAL_TABLET | Freq: Four times a day (QID) | ORAL | Status: DC | PRN

## 2015-02-23 MED ORDER — ALLOPURINOL 300 MG PO TABS: 300.0000 mg | ORAL_TABLET | Freq: Every day | ORAL | Status: DC

## 2015-02-23 NOTE — Telephone Encounter (Signed)
gv and printed appt sched and avs for pt for March adn April...sed added tx...Marland Kitchenper MD IT day1 and r-chop day2.

## 2015-02-23 NOTE — Assessment & Plan Note (Signed)
We discussed the role of chemotherapy. The intent is for cure.  We discussed some of the risks, benefits and side-effects of Rituximab,Cytoxan, Adriamycin,Vincristine and Solumedrol/Prednisone with intrathecal methotrexate for CNS prophylaxis.  Some of the short term side-effects included, though not limited to, risk of fatigue, weight loss, tumor lysis syndrome, risk of allergic reactions, pancytopenia, life-threatening infections, need for transfusions of blood products, nausea, vomiting, change in bowel habits, hair loss, risk of congestive heart failure, admission to hospital for various reasons, and risks of death.   Long term side-effects are also discussed including permanent damage to nerve function, chronic fatigue, and rare secondary malignancy including bone marrow disorders.   The patient is aware that the response rates discussed earlier is not guaranteed.    After a long discussion, patient made an informed decision to proceed with the prescribed plan of care and went ahead to sign the consent form today.   Patient education material was dispensed

## 2015-02-23 NOTE — Progress Notes (Signed)
[facility] OFF[facility] PROGRESS NOTE  Patient Care Team: Kennon Rounds, MD as PCP - General (Internal Medicine) [name], MD as Consulting Physician (Hematology and Oncology) Ascencion Dike, MD as Consulting Physician (Otolaryngology)  SUMMARY OF ONCOLOGIC HISTORY:   Diffuse large B cell lymphoma   01/31/2015 Imaging CT scan of the neck showed large mass centered around the right parapharyngeal space, measuring approximately 3.6 cm with bulky lymphadenopathy on the right side. Incidentally 2 nodules were found.   02/05/2015 Pathology Results Accession: [id number] biopsy confirmed diffuse large B-cell lymphoma   02/05/2015 Surgery She had excisional lymph node biopsy of the right neck mass   02/15/2015 Initial Diagnosis Diffuse large B cell lymphoma    INTERVAL HISTORY: Please see below for problem oriented charting. She returns to review all test results. She is doing well. REVIEW OF SYSTEMS:   Constitutional: Denies fevers, chills or abnormal weight loss Eyes: Denies blurriness of vision Ears, nose, mouth, throat, and face: Denies mucositis or sore throat Respiratory: Denies cough, dyspnea or wheezes Cardiovascular: Denies palpitation, chest discomfort or lower extremity swelling Gastrointestinal:  Denies nausea, heartburn or change in bowel habits Skin: Denies abnormal skin rashes Neurological:Denies numbness, tingling or new weaknesses Behavioral/Psych: Mood is stable, no new changes  All other systems were reviewed with the patient and are negative.  I have reviewed the past medical history, past surgical history, social history and family history with the patient and they are unchanged from previous note.  ALLERGIES:  is allergic to erythromycin; hydrocodone; peanut-containing drug products; and biaxin.  MEDICATIONS:  Current Outpatient Prescriptions  Medication Sig Dispense Refill  . carboxymethylcellulose 1 % ophthalmic solution 1 drop 3 (three) times daily. Refresh     . PRESCRIPTION MEDICATION Place 4 drops into the right ear daily as needed (tube in ear for drainage.).     Marland Kitchen allopurinol (ZYLOPRIM) 300 MG tablet Take 1 tablet (300 mg total) by mouth daily. 30 tablet 3  . lidocaine-prilocaine (EMLA) cream Apply to affected area once 30 g 3  . ondansetron (ZOFRAN) 8 MG tablet Take 1 tablet (8 mg total) by mouth every 8 (eight) hours as needed. 30 tablet 1  . predniSONE (DELTASONE) 50 MG tablet Take 1 tablet (50 mg total) by mouth daily. Take on days 2-5 of chemotherapy. 12 tablet 0  . prochlorperazine (COMPAZINE) 10 MG tablet Take 1 tablet (10 mg total) by mouth every 6 (six) hours as needed (Nausea or vomiting). 30 tablet 6   No current facility-administered medications for this visit.    PHYSICAL EXAMINATION: ECOG PERFORMANCE STATUS: 0 - Asymptomatic  Filed Vitals:   02/23/15 1407  BP: 115/71  Pulse: 60  Temp: 98 F (36.7 C)  Resp: 19   Filed Weights   02/23/15 1407  Weight: 147 lb 1.6 oz (66.724 kg)    GENERAL:alert, no distress and comfortable SKIN: skin color, texture, turgor are normal, no rashes or significant lesions EYES: normal, Conjunctiva are pink and non-injected, sclera clear OROPHARYNX:no exudate, no erythema and lips, buccal mucosa, and tongue normal  NECK: supple, thyroid normal size, non-tender, without nodularity LYMPH:  Persistent, bilateral lymphadenopathy in the neck. LUNGS: clear to auscultation and percussion with normal breathing effort HEART: regular rate & rhythm and no murmurs and no lower extremity edema ABDOMEN:abdomen soft, non-tender and normal bowel sounds Musculoskeletal:no cyanosis of digits and no clubbing  NEURO: alert & oriented x 3 with fluent speech, no focal motor/sensory deficits  LABORATORY DATA:  I have reviewed the  data as listed    Component Value Date/Time   NA 140 02/19/2015 0720   K 4.7 02/19/2015 0720   CL 106 02/19/2015 0720   CO2 26 02/19/2015 0720   GLUCOSE 82 02/19/2015 0720   BUN  15 02/19/2015 0720   CREATININE 0.62 02/19/2015 0720   CREATININE 0.65 01/08/2015 0946   CALCIUM 9.1 02/19/2015 0720   PROT 6.9 02/19/2015 0720   ALBUMIN 3.9 02/19/2015 0720   AST 24 02/19/2015 0720   ALT 19 02/19/2015 0720   ALKPHOS 55 02/19/2015 0720   BILITOT 0.5 02/19/2015 0720   GFRNONAA >90 02/19/2015 0720   GFRAA >90 02/19/2015 0720    No results found for: SPEP, UPEP  Lab Results  Component Value Date   WBC 2.5* 02/19/2015   NEUTROABS 0.9* 02/19/2015   HGB 12.3 02/19/2015   HCT 37.3 02/19/2015   MCV 89.0 02/19/2015   PLT 222 02/19/2015      Chemistry      Component Value Date/Time   NA 140 02/19/2015 0720   K 4.7 02/19/2015 0720   CL 106 02/19/2015 0720   CO2 26 02/19/2015 0720   BUN 15 02/19/2015 0720   CREATININE 0.62 02/19/2015 0720   CREATININE 0.65 01/08/2015 0946      Component Value Date/Time   CALCIUM 9.1 02/19/2015 0720   ALKPHOS 55 02/19/2015 0720   AST 24 02/19/2015 0720   ALT 19 02/19/2015 0720   BILITOT 0.5 02/19/2015 0720       RADIOGRAPHIC STUDIES:I reviewed the PET CT scan with the patient and family I have personally reviewed the radiological images as listed and agreed with the findings in the report.  ASSESSMENT & PLAN:  Diffuse large B cell lymphoma We discussed the role of chemotherapy. The intent is for cure.  We discussed some of the risks, benefits and side-effects of Rituximab,Cytoxan, Adriamycin,Vincristine and Solumedrol/Prednisone with intrathecal methotrexate for CNS prophylaxis.  Some of the short term side-effects included, though not limited to, risk of fatigue, weight loss, tumor lysis syndrome, risk of allergic reactions, pancytopenia, life-threatening infections, need for transfusions of blood products, nausea, vomiting, change in bowel habits, hair loss, risk of congestive heart failure, admission to hospital for various reasons, and risks of death.   Long term side-effects are also discussed including permanent  damage to nerve function, chronic fatigue, and rare secondary malignancy including bone marrow disorders.   The patient is aware that the response rates discussed earlier is not guaranteed.    After a long discussion, patient made an informed decision to proceed with the prescribed plan of care and went ahead to sign the consent form today.   Patient education material was dispensed     Leukopenia This is chronic in nature, likely constitutional. We will proceed with treatment without dose adjustment. She will receive Neulasta for coverage  The patient had stage II disease. I reviewed the guidelines and plan would be to do 3 cycles of R-CHOP followed by possible radiation treatment. She will receive intrathecal chemotherapy. Her calculated IPI score is only one giving her good prognosis at 5 years  Orders Placed This Encounter  Procedures  . CBC with Differential    Standing Status: Standing     Number of Occurrences: 20     Standing Expiration Date: 02/24/2016  . Comprehensive metabolic panel    Standing Status: Standing     Number of Occurrences: 20     Standing Expiration Date: 02/24/2016  . PHYSICIAN COMMUNICATION ORDER  A baseline Echo/ Muga should be obtained prior to initiation of Anthracycline Chemotherapy  . PHYSICIAN COMMUNICATION ORDER    Hepatitis B Virus screening with HBsAg and anti-HBc recommended prior to treatment with rituximab (Rituxan), ofatumumab (Arzerra) or obinutuzumab Dyann Kief).   All questions were answered. The patient knows to call the clinic with any problems, questions or concerns. No barriers to learning was detected. I spent 40 minutes counseling the patient face to face. The total time spent in the appointment was 60 minutes and more than 50% was on counseling and review of test results     [facility], [place], MD 02/23/2015 4:31 PM

## 2015-02-23 NOTE — Assessment & Plan Note (Signed)
This is chronic in nature, likely constitutional. We will proceed with treatment without dose adjustment. She will receive Neulasta for coverage

## 2015-02-26 ENCOUNTER — Ambulatory Visit (HOSPITAL_COMMUNITY)
Admission: RE | Admit: 2015-02-26 | Discharge: 2015-02-26 | Disposition: A | Discharge status: Home / Self Care | Payer: Medicare Other | Source: Ambulatory Visit | Attending: Interventional Radiology | Admitting: Interventional Radiology

## 2015-02-26 ENCOUNTER — Other Ambulatory Visit: Payer: Self-pay | Admitting: Hematology and Oncology

## 2015-02-26 ENCOUNTER — Encounter (HOSPITAL_COMMUNITY): Payer: Self-pay

## 2015-02-26 ENCOUNTER — Ambulatory Visit (HOSPITAL_COMMUNITY)
Admission: RE | Admit: 2015-02-26 | Discharge: 2015-02-26 | Disposition: A | Discharge status: Home / Self Care | Payer: Medicare Other | Source: Ambulatory Visit | Attending: Hematology and Oncology | Admitting: Hematology and Oncology

## 2015-02-26 DIAGNOSIS — C8331 Diffuse large B-cell lymphoma, lymph nodes of head, face, and neck: Secondary | ICD-10-CM | POA: Diagnosis not present

## 2015-02-26 DIAGNOSIS — Z87891 Personal history of nicotine dependence: Secondary | ICD-10-CM | POA: Insufficient documentation

## 2015-02-26 DIAGNOSIS — C8330 Diffuse large B-cell lymphoma, unspecified site: Secondary | ICD-10-CM

## 2015-02-26 DIAGNOSIS — Z79899 Other long term (current) drug therapy: Secondary | ICD-10-CM | POA: Insufficient documentation

## 2015-02-26 DIAGNOSIS — M1990 Unspecified osteoarthritis, unspecified site: Secondary | ICD-10-CM | POA: Insufficient documentation

## 2015-02-26 DIAGNOSIS — C8590 Non-Hodgkin lymphoma, unspecified, unspecified site: Secondary | ICD-10-CM | POA: Diagnosis not present

## 2015-02-26 DIAGNOSIS — Z452 Encounter for adjustment and management of vascular access device: Secondary | ICD-10-CM | POA: Diagnosis not present

## 2015-02-26 LAB — BASIC METABOLIC PANEL
Anion gap: 6 (ref 5–15)
BUN: 9 mg/dL (ref 6–23)
CO2: 29 mmol/L (ref 19–32)
Calcium: 9 mg/dL (ref 8.4–10.5)
Chloride: 102 mmol/L (ref 96–112)
Creatinine, Ser: 0.72 mg/dL (ref 0.50–1.10)
GFR calc Af Amer: >90 mL/min (ref >90)
GFR calc non Af Amer: 87 mL/min — ABNORMAL LOW (ref >90)
Glucose, Bld: 79 mg/dL (ref 70–99)
Potassium: 3.7 mmol/L (ref 3.5–5.1)
Sodium: 137 mmol/L (ref 135–145)

## 2015-02-26 LAB — CBC
HCT: 36.1 % (ref 36.0–46.0)
Hemoglobin: 12.1 g/dL (ref 12.0–15.0)
MCH: 29.7 pg (ref 26.0–34.0)
MCHC: 33.5 g/dL (ref 30.0–36.0)
MCV: 88.5 fL (ref 78.0–100.0)
Platelets: 244 10*3/uL (ref 150–400)
RBC: 4.08 MIL/uL (ref 3.87–5.11)
RDW: 13.8 % (ref 11.5–15.5)
WBC: 2.6 10*3/uL — ABNORMAL LOW (ref 4.0–10.5)

## 2015-02-26 LAB — PROTIME-INR
INR: 0.94 (ref 0.00–1.49)
Prothrombin Time: 12.7 seconds (ref 11.6–15.2)

## 2015-02-26 LAB — APTT: aPTT: 32 seconds (ref 24–37)

## 2015-02-26 MED ORDER — HEPARIN SOD (PORK) LOCK FLUSH 100 UNIT/ML IV SOLN
INTRAVENOUS | Status: AC
  Filled 2015-02-26: qty 5

## 2015-02-26 MED ORDER — MIDAZOLAM HCL 2 MG/2ML IJ SOLN
INTRAMUSCULAR | Status: AC | PRN
  Administered 2015-02-26 (×4): 1 mg via INTRAVENOUS

## 2015-02-26 MED ORDER — CEFAZOLIN SODIUM-DEXTROSE 2-3 GM-% IV SOLR
INTRAVENOUS | Status: AC
  Filled 2015-02-26: qty 50

## 2015-02-26 MED ORDER — HEPARIN SOD (PORK) LOCK FLUSH 100 UNIT/ML IV SOLN
INTRAVENOUS | Status: AC | PRN
  Administered 2015-02-26: 500 [IU]

## 2015-02-26 MED ORDER — SODIUM CHLORIDE 0.9 % IV SOLN
Freq: Once | INTRAVENOUS | Status: AC
  Administered 2015-02-26: 14:00:00 via INTRAVENOUS

## 2015-02-26 MED ORDER — CEFAZOLIN SODIUM-DEXTROSE 2-3 GM-% IV SOLR: 2.0000 g | Freq: Once | INTRAVENOUS | Status: DC

## 2015-02-26 MED ORDER — FENTANYL CITRATE 0.05 MG/ML IJ SOLN
INTRAMUSCULAR | Status: AC
  Filled 2015-02-26: qty 4

## 2015-02-26 MED ORDER — MIDAZOLAM HCL 2 MG/2ML IJ SOLN
INTRAMUSCULAR | Status: AC
  Filled 2015-02-26: qty 6

## 2015-02-26 MED ORDER — LIDOCAINE-EPINEPHRINE 2 %-1:100000 IJ SOLN
INTRAMUSCULAR | Status: AC
  Filled 2015-02-26: qty 1

## 2015-02-26 MED ORDER — FENTANYL CITRATE 0.05 MG/ML IJ SOLN
INTRAMUSCULAR | Status: AC | PRN
  Administered 2015-02-26: 50 ug via INTRAVENOUS
  Administered 2015-02-26 (×2): 25 ug via INTRAVENOUS

## 2015-02-26 NOTE — Procedures (Signed)
Successful placement of right IJ approach port-a-cath with tip at the superior caval atrial junction. The catheter is ready for immediate use. No immediate post procedural complications. 

## 2015-02-26 NOTE — Progress Notes (Signed)
Report to Kim

## 2015-02-26 NOTE — Discharge Instructions (Signed)
Implanted Port Insertion, Care After                                                                                                Call [facility] Radiology [phone] for any questions or problemsLight activity today. No change in diet or medications. Tylenol if needed for pain if no relief call above number. Leave dressing in place for 24 hours the may remove and shower. Do not submerge in water for 2 weeks.   Refer to this sheet in the next few weeks. These instructions provide you with information on caring for yourself after your procedure. Your health care provider may also give you more specific instructions. Your treatment has been planned according to current medical practices, but problems sometimes occur. Call your health care provider if you have any problems or questions after your procedure. WHAT TO EXPECT AFTER THE PROCEDURE After your procedure, it is typical to have the following:   Discomfort at the port insertion site. Ice packs to the area will help.  Bruising on the skin over the port. This will subside in 3-4 days. HOME CARE INSTRUCTIONS  After your port is placed, you will get a manufacturer's information card. The card has information about your port. Keep this card with you at all times.   Know what kind of port you have. There are many types of ports available.   Wear a medical alert bracelet in case of an emergency. This can help alert health care workers that you have a port.   The port can stay in for as long as your health care provider believes it is necessary.   A home health care nurse may give medicines and take care of the port.   You or a family member can get special training and directions for giving medicine and taking care of the port at home.  SEEK MEDICAL CARE IF:   Your port does not flush or you are unable to get a  blood return.   You have a fever or chills. SEEK IMMEDIATE MEDICAL CARE IF:  You have new fluid or pus coming from your incision.   You notice a bad smell coming from your incision site.   You have swelling, pain, or more redness at the incision or port site.   You have chest pain or shortness of breath. Document Released: 09/28/2013 Document Revised: 12/13/2013 Document Reviewed: 09/28/2013 [facility] Patient Information 2015 [place], [place]. This information is not intended to replace advice given to you by your health care provider. Make sure you discuss any questions you have with your health care provider. Implanted [facility] Guide An implanted port is a type of central line that is placed under the skin. Central lines are used to provide IV access when treatment or nutrition needs to be given through a person's veins. Implanted ports are used for long-term IV access. An implanted port may be placed because:   You need IV medicine that would be irritating to the small veins in your hands or arms.   You need long-term IV medicines, such as antibiotics.  You need IV nutrition for a long period.   You need frequent blood draws for lab tests.   You need dialysis.  Implanted ports are usually placed in the chest area, but they can also be placed in the upper arm, the abdomen, or the leg. An implanted port has two main parts:   Reservoir. The reservoir is round and will appear as a small, raised area under your skin. The reservoir is the part where a needle is inserted to give medicines or draw blood.   Catheter. The catheter is a thin, flexible tube that extends from the reservoir. The catheter is placed into a large vein. Medicine that is inserted into the reservoir goes into the catheter and then into the vein.  HOW WILL I CARE FOR MY INCISION SITE? Do not get the incision site wet. Bathe or shower as directed by your health care provider.  HOW IS MY PORT  ACCESSED? Special steps must be taken to access the port:   Before the port is accessed, a numbing cream can be placed on the skin. This helps numb the skin over the port site.   Your health care provider uses a sterile technique to access the port.  Your health care provider must put on a mask and sterile gloves.  The skin over your port is cleaned carefully with an antiseptic and allowed to dry.  The port is gently pinched between sterile gloves, and a needle is inserted into the port.  Only "non-coring" port needles should be used to access the port. Once the port is accessed, a blood return should be checked. This helps ensure that the port is in the vein and is not clogged.   If your port needs to remain accessed for a constant infusion, a clear (transparent) bandage will be placed over the needle site. The bandage and needle will need to be changed every week, or as directed by your health care provider.   Keep the bandage covering the needle clean and dry. Do not get it wet. Follow your health care provider's instructions on how to take a shower or bath while the port is accessed.   If your port does not need to stay accessed, no bandage is needed over the port.  WHAT IS FLUSHING? Flushing helps keep the port from getting clogged. Follow your health care provider's instructions on how and when to flush the port. Ports are usually flushed with saline solution or a medicine called heparin. The need for flushing will depend on how the port is used.   If the port is used for intermittent medicines or blood draws, the port will need to be flushed:   After medicines have been given.   After blood has been drawn.   As part of routine maintenance.   If a constant infusion is running, the port may not need to be flushed.  HOW LONG WILL MY PORT STAY IMPLANTED? The port can stay in for as long as your health care provider thinks it is needed. When it is time for the port to  come out, surgery will be done to remove it. The procedure is similar to the one performed when the port was put in.  WHEN SHOULD I SEEK IMMEDIATE MEDICAL CARE? When you have an implanted port, you should seek immediate medical care if:   You notice a bad smell coming from the incision site.   You have swelling, redness, or drainage at the incision site.   You  have more swelling or pain at the port site or the surrounding area.   You have a fever that is not controlled with medicine. Document Released: 12/08/2005 Document Revised: 09/28/2013 Document Reviewed: 08/15/2013 [facility] Patient Information 2015 [place], [place]. This information is not intended to replace advice given to you by your health care provider. Make sure you discuss any questions you have with your health care provider. Conscious Sedation Sedation is the use of medicines to promote relaxation and relieve discomfort and anxiety. Conscious sedation is a type of sedation. Under conscious sedation you are less alert than normal but are still able to respond to instructions or stimulation. Conscious sedation is used during short medical and dental procedures. It is milder than deep sedation or general anesthesia and allows you to return to your regular activities sooner.  LET [facility] CARE PROVIDER KNOW ABOUT:   Any allergies you have.  All medicines you are taking, including vitamins, herbs, eye drops, creams, and over-the-counter medicines.  Use of steroids (by mouth or creams).  Previous problems you or members of your family have had with the use of anesthetics.  Any blood disorders you have.  Previous surgeries you have had.  Medical conditions you have.  Possibility of pregnancy, if this applies.  Use of cigarettes, alcohol, or illegal drugs. RISKS AND COMPLICATIONS Generally, this is a safe procedure. However, as with any procedure, problems can occur. Possible problems  include:  Oversedation.  Trouble breathing on your own. You may need to have a breathing tube until you are awake and breathing on your own.  Allergic reaction to any of the medicines used for the procedure. BEFORE THE PROCEDURE  You may have blood tests done. These tests can help show how well your kidneys and liver are working. They can also show how well your blood clots.  A physical exam will be done.  Only take medicines as directed by your health care provider. You may need to stop taking medicines (such as blood thinners, aspirin, or nonsteroidal anti-inflammatory drugs) before the procedure.   Do not eat or drink at least 6 hours before the procedure or as directed by your health care provider.  Arrange for a responsible adult, family member, or friend to take you home after the procedure. He or she should stay with you for at least 24 hours after the procedure, until the medicine has worn off. PROCEDURE   An intravenous (IV) catheter will be inserted into one of your veins. Medicine will be able to flow directly into your body through this catheter. You may be given medicine through this tube to help prevent pain and help you relax.  The medical or dental procedure will be done. AFTER THE PROCEDURE  You will stay in a recovery area until the medicine has worn off. Your blood pressure and pulse will be checked.   Depending on the procedure you had, you may be allowed to go home when you can tolerate liquids and your pain is under control. Document Released: 09/02/2001 Document Revised: 12/13/2013 Document Reviewed: 08/15/2013 [facility] Patient Information 2015 [place], [place]. This information is not intended to replace advice given to you by your health care provider. Make sure you discuss any questions you have with your health care provider.

## 2015-02-26 NOTE — H&P (Signed)
Chief Complaint: "I am here for a port."  Referring Physician(s): Gorsuch,Ni  History of Present Illness: Tabitha Jackson is a 69 y.o. female with diffuse Large B cell lymphoma who has been seen by Dr. Alvy Bimler on 02/23/15 and scheduled today for image guided port a catheter placement. She denies any chest pain, shortness of breath or palpitations. She denies any active signs of bleeding or excessive bruising. She denies any recent fever or chills. The patient denies any history of sleep apnea or chronic oxygen use. She has previously tolerated anesthesia without complications.    Past Medical History  Diagnosis Date  . Arthritis   . Wears partial dentures   . Wears glasses   . Decreased hearing of right ear   . Diffuse large B cell lymphoma 02/15/2015    Past Surgical History  Procedure Laterality Date  . Appendectomy    . Abdominal hysterectomy    . Eye surgery  2015    cataract left  . Tonsillectomy    . Colonoscopy    . Mass excision Right 02/05/2015    Procedure: EXCISIONAL BIOPSY OF RIGHT NECK MASS;  Surgeon: Ascencion Dike, MD;  Location: [place];  Service: ENT;  Laterality: Right;  Marland Kitchen Myringotomy with tube placement Right 02/05/2015    Procedure: MYRINGOTOMY WITH TUBE PLACEMENT;  Surgeon: Ascencion Dike, MD;  Location: [place];  Service: ENT;  Laterality: Right;    Allergies: Erythromycin; Hydrocodone; Peanut-containing drug products; and Biaxin  Medications: Prior to Admission medications   Medication Sig Start Date End Date Taking? Authorizing Provider  allopurinol (ZYLOPRIM) 300 MG tablet Take 1 tablet (300 mg total) by mouth daily. 02/23/15  Yes [name], MD  carboxymethylcellulose 1 % ophthalmic solution 1 drop 3 (three) times daily. Refresh   Yes Historical Provider, MD  lidocaine-prilocaine (EMLA) cream Apply to affected area once 02/23/15   [name], MD  ondansetron (ZOFRAN) 8 MG tablet Take 1 tablet (8 mg total) by mouth every 8  (eight) hours as needed. 02/23/15   [name], MD  predniSONE (DELTASONE) 50 MG tablet Take 1 tablet (50 mg total) by mouth daily. Take on days 2-5 of chemotherapy. 02/23/15   [name], MD  PRESCRIPTION MEDICATION Place 4 drops into the right ear daily as needed (tube in ear for drainage.).     Historical Provider, MD  prochlorperazine (COMPAZINE) 10 MG tablet Take 1 tablet (10 mg total) by mouth every 6 (six) hours as needed (Nausea or vomiting). 02/23/15   [name], MD     Family History  Problem Relation Age of Onset  . Heart disease Mother   . Hypertension Mother   . Cancer Brother     leukemia  . Cancer Brother     colon ca    History   Social History  . Marital Status: Married    Spouse Name: N/A  . Number of Children: N/A  . Years of Education: N/A   Social History Main Topics  . Smoking status: Former Smoker    Quit date: 02/02/1974  . Smokeless tobacco: Never Used  . Alcohol Use: No  . Drug Use: No  . Sexual Activity: No   Other Topics Concern  . None   Social History Narrative    Review of Systems: A 12 point ROS discussed and pertinent positives are indicated in the HPI above.  All other systems are negative.  Review of Systems  Vital Signs: BP 99/72  mmHg  Pulse 60  Temp(Src) 98 F (36.7 C) (Oral)  Resp 16  SpO2 100%  Physical Exam  Constitutional: She is oriented to person, place, and time. No distress.  HENT:  Head: Normocephalic and atraumatic.  Neck: No tracheal deviation present.  Cardiovascular: Normal rate and regular rhythm.  Exam reveals no gallop and no friction rub.   No murmur heard. Pulmonary/Chest: Effort normal and breath sounds normal. No respiratory distress. She has no wheezes. She has no rales.  Abdominal: Soft. Bowel sounds are normal.  Neurological: She is alert and oriented to person, place, and time.  Skin: She is not diaphoretic.  Psychiatric: She has a normal mood and affect. Her behavior is normal. Thought content  normal.    Mallampati Score:  MD Evaluation Airway: WNL Heart: WNL Abdomen: WNL Chest/ Lungs: WNL ASA  Classification: 3 Mallampati/Airway Score: Two  Imaging: Ct Soft Tissue Neck W Contrast  01/31/2015   CLINICAL DATA:  Right neck adenopathy  EXAM: CT NECK WITH CONTRAST  TECHNIQUE: Multidetector CT imaging of the neck was performed using the standard protocol following the bolus administration of intravenous contrast.  CONTRAST:  80mL OMNIPAQUE IOHEXOL 300 MG/ML  SOLN  COMPARISON:  None.  FINDINGS: Pharynx and larynx: Large mass centered in the right parapharyngeal space. The parapharyngeal fat is obliterated. The mass measures approximately 3.6 cm and extends superiorly towards the nasopharynx. The epicenter appears to be in the right tonsil. This is most consistent with carcinoma. Left tonsil is normal. Airway is patent. Epiglottis and larynx are normal.  Salivary glands: Negative  Thyroid: Small left thyroid nodules measuring 5 mm.  Lymph nodes: Bulky adenopathy in the right neck. Right level 2 known node 17 mm. Right posterior superior lymph node measures 20 mm. Right posterior mid cervical lymph node measures 25 x 22 mm. Right posterior lymph node at the level of the hyoid measures 11 mm. Right lower posterior lymph node measures 10 x 19 mm.  Left-sided level 2 lymph node measures 8 mm. This is likely reactive. No pathologic adenopathy in the left neck.  Vascular: Carotid and jugular are patent bilaterally without stenosis.  Limited intracranial: Negative  Visualized orbits: Negative  Mastoids and visualized paranasal sinuses: Right mastoid sinus effusion. Advanced degenerative change in the right TMJ. Mild mucosal edema right maxillary sinus and ethmoid sinus.  Skeleton: No destructive bony lesion identified. Negative for fracture.  Upper chest: Lung nodules the right apex measuring 4 mm and 6 mm. Dedicated chest CT suggested for further evaluation.  IMPRESSION: Large mass in the right  parapharyngeal space most consistent with tonsillar carcinoma. There is metastatic adenopathy throughout the right neck with bulky right sided adenopathy. No enlarged lymph nodes on the left.  Small lung nodules right apex, worrisome for metastatic disease. Dedicated chest CT recommended for further evaluation.   Electronically Signed   By: Franchot Gallo M.D.   On: 01/31/2015 15:13   Nm Pet Image Initial (pi) Skull Base To Thigh  02/21/2015   CLINICAL DATA:  Initial treatment strategy for lymphoma. Non-Hodgkin's lymphoma. Large B-cell lymphoma of the right neck.  EXAM: NUCLEAR MEDICINE PET SKULL BASE TO THIGH  TECHNIQUE: 7.8 mCi F-18 FDG was injected intravenously. Full-ring PET imaging was performed from the skull base to thigh after the radiotracer. CT data was obtained and used for attenuation correction and anatomic localization.  FASTING BLOOD GLUCOSE:  Value: 76 mg/dl  COMPARISON:  Neck CT 01/31/2015  FINDINGS: NECK  There is a hypermetabolic mass in  the right parapharyngeal space which measures approximately 3.4 x 3.3 cm with SUV max 7.5. There are enlarged hypermetabolic right level II lymph nodes as seen on comparison CT. Example lymph node measures 2.5 cm (image number 10 of series 4 with SUV max 8.9).  Hypermetabolic tissue extends superiorly to the level of the right temporal bone/ of parotid gland. The superior metabolic tissue suggests in the auditory canal. Metabolic activities is intense with SUV max 12.0. The hypermetabolic adenopathy extends inferiorly to the level III and level IV lymph nodes just below the hyoid bone. The smaller lymph inferior nodes are also intensely hypermetabolic with SUV max equal 7 (image 21). No contralateral hypermetabolic lymph nodes.  CHEST  No hypermetabolic mediastinal or hilar nodes. Within the right upper lobe 7 mm nodule in 56, series4 has no clear associated metabolic activity. Nodule within the right lower lobe measures 7 mm on image 51, series 4 also has no  discrete metabolic activity.  ABDOMEN/PELVIS  No abnormal hypermetabolic activity within the liver, pancreas, adrenal glands, or spleen. No hypermetabolic lymph nodes in the abdomen or pelvis. Normal volume spleen.  SKELETON  No focal hypermetabolic activity to suggest skeletal metastasis.  IMPRESSION: 1. Hypermetabolic masslike conglomeration in the right parapharyngeal space consistent with lymphoma. 2. Extensive hypermetabolic lymphadenopathy along the right neck. Abnormal metabolic activity extends from the right parotid gland (just inferiorly to auditory canal) inferiorly to level III lymph nodes just below the hyoid bone. 3. No mediastinal or abdominal or pelvic hypermetabolic lymph nodes. 4. Two small nodules within the right lung do not have associated metabolic activity. Recommend attention follow-up. 5. Normal spleen. 6. No evidence of bone marrow involvement.   Electronically Signed   By: Suzy Bouchard M.D.   On: 02/21/2015 16:42    Labs:  CBC:  Recent Labs  12/20/14 0849 01/08/15 0952 02/19/15 0720 02/26/15 1330  WBC 3.2* 2.8* 2.5* 2.6*  HGB 12.8 12.6 12.3 12.1  HCT 39.9 39.2 37.3 36.1  PLT  --   --  222 244    COAGS: No results for input(s): INR, APTT in the last 8760 hours.  BMP:  Recent Labs  01/08/15 0946 02/19/15 0720  NA 139 140  K 4.9 4.7  CL 101 106  CO2 32 26  GLUCOSE 78 82  BUN 9 15  CALCIUM 9.6 9.1  CREATININE 0.65 0.62  GFRNONAA  --  >90  GFRAA  --  >90    LIVER FUNCTION TESTS:  Recent Labs  02/19/15 0720  BILITOT 0.5  AST 24  ALT 19  ALKPHOS 55  PROT 6.9  ALBUMIN 3.9   Assessment and Plan: Diffuse Large B cell lymphoma Seen by Dr. Alvy Bimler on 02/23/15 Scheduled today for image guided port a catheter placement with moderate sedation The patient has been NPO, no blood thinners taken, labs and vitals have been reviewed. Risks and Benefits discussed with the patient including, but not limited to bleeding, infection, pneumothorax, or fibrin  sheath development and need for additional procedures. All of the patient's questions were answered, patient is agreeable to proceed. Consent signed and in chart.   Thank you for this interesting consult.  I greatly enjoyed meeting Tabitha Jackson and look forward to participating in their care.  SignedHedy Jacob 02/26/2015, 1:54 PM   I spent a total of 20 Minutes in face to face in clinical consultation, greater than 50% of which was counseling/coordinating care for large B cell lymphoma

## 2015-02-28 LAB — CHROMOSOME ANALYSIS, BONE MARROW

## 2015-03-01 ENCOUNTER — Ambulatory Visit (HOSPITAL_BASED_OUTPATIENT_CLINIC_OR_DEPARTMENT_OTHER): Payer: Medicare Other | Admitting: Hematology and Oncology

## 2015-03-01 ENCOUNTER — Ambulatory Visit (HOSPITAL_BASED_OUTPATIENT_CLINIC_OR_DEPARTMENT_OTHER): Payer: Medicare Other

## 2015-03-01 ENCOUNTER — Other Ambulatory Visit: Payer: Self-pay | Admitting: Hematology and Oncology

## 2015-03-01 ENCOUNTER — Other Ambulatory Visit (HOSPITAL_COMMUNITY)
Admission: AD | Admit: 2015-03-01 | Discharge: 2015-03-01 | Disposition: A | Discharge status: Home / Self Care | Payer: Medicare Other | Source: Ambulatory Visit | Attending: Hematology and Oncology | Admitting: Hematology and Oncology

## 2015-03-01 DIAGNOSIS — C8330 Diffuse large B-cell lymphoma, unspecified site: Secondary | ICD-10-CM | POA: Diagnosis not present

## 2015-03-01 DIAGNOSIS — Z5111 Encounter for antineoplastic chemotherapy: Secondary | ICD-10-CM | POA: Diagnosis not present

## 2015-03-01 DIAGNOSIS — C8331 Diffuse large B-cell lymphoma, lymph nodes of head, face, and neck: Secondary | ICD-10-CM | POA: Diagnosis not present

## 2015-03-01 DIAGNOSIS — D72819 Decreased white blood cell count, unspecified: Secondary | ICD-10-CM | POA: Diagnosis not present

## 2015-03-01 LAB — CSF CELL COUNT WITH DIFFERENTIAL
RBC Count, CSF: 149 /mm3 — ABNORMAL HIGH
Tube #: 1
WBC, CSF: 1 /mm3 (ref 0–5)

## 2015-03-01 LAB — PROTEIN, CSF: Total  Protein, CSF: 22 mg/dL (ref 15–45)

## 2015-03-01 LAB — GLUCOSE, CSF: Glucose, CSF: 58 mg/dL (ref 43–76)

## 2015-03-01 MED ORDER — SODIUM CHLORIDE 0.9 % IJ SOLN
10.0000 mL | INTRAMUSCULAR | Status: DC | PRN
  Administered 2015-03-01: 10 mL
  Filled 2015-03-01: qty 10

## 2015-03-01 MED ORDER — HEPARIN SOD (PORK) LOCK FLUSH 100 UNIT/ML IV SOLN
500.0000 [IU] | Freq: Once | INTRAVENOUS | Status: AC | PRN
  Administered 2015-03-01: 500 [IU]
  Filled 2015-03-01: qty 5

## 2015-03-01 MED ORDER — SODIUM CHLORIDE 0.9 % IV SOLN
Freq: Once | INTRAVENOUS | Status: AC
  Administered 2015-03-01: 14:00:00 via INTRAVENOUS

## 2015-03-01 MED ORDER — SODIUM CHLORIDE 0.9 % IJ SOLN
Freq: Once | INTRAMUSCULAR | Status: AC
  Administered 2015-03-01: 16:00:00 via INTRATHECAL
  Filled 2015-03-01: qty 0.48

## 2015-03-01 NOTE — Patient Instructions (Signed)
[facility] Discharge Instructions for Patients Receiving Chemotherapy  Today you received the following chemotherapy agents Methotrexate.  To help prevent nausea and vomiting after your treatment, we encourage you to take your nausea medication as prescribed   If you develop nausea and vomiting that is not controlled by your nausea medication, call the clinic.   BELOW ARE SYMPTOMS THAT SHOULD BE REPORTED IMMEDIATELY:  *FEVER GREATER THAN 100.5 F  *CHILLS WITH OR WITHOUT FEVER  NAUSEA AND VOMITING THAT IS NOT CONTROLLED WITH YOUR NAUSEA MEDICATION  *UNUSUAL SHORTNESS OF BREATH  *UNUSUAL BRUISING OR BLEEDING  TENDERNESS IN MOUTH AND THROAT WITH OR WITHOUT PRESENCE OF ULCERS  *URINARY PROBLEMS  *BOWEL PROBLEMS  UNUSUAL RASH Items with * indicate a potential emergency and should be followed up as soon as possible.  Feel free to call the clinic you have any questions or concerns. The clinic phone number is [phone].

## 2015-03-02 ENCOUNTER — Encounter: Payer: Self-pay | Admitting: Hematology and Oncology

## 2015-03-02 ENCOUNTER — Ambulatory Visit: Payer: Medicare Other | Admitting: Hematology and Oncology

## 2015-03-02 ENCOUNTER — Ambulatory Visit (HOSPITAL_BASED_OUTPATIENT_CLINIC_OR_DEPARTMENT_OTHER): Payer: Medicare Other

## 2015-03-02 ENCOUNTER — Ambulatory Visit: Payer: Medicare Other | Admitting: Nutrition

## 2015-03-02 DIAGNOSIS — C8331 Diffuse large B-cell lymphoma, lymph nodes of head, face, and neck: Secondary | ICD-10-CM

## 2015-03-02 DIAGNOSIS — Z5111 Encounter for antineoplastic chemotherapy: Secondary | ICD-10-CM

## 2015-03-02 DIAGNOSIS — C8330 Diffuse large B-cell lymphoma, unspecified site: Secondary | ICD-10-CM

## 2015-03-02 MED ORDER — ACETAMINOPHEN 325 MG PO TABS
ORAL_TABLET | ORAL | Status: AC
  Filled 2015-03-02: qty 2

## 2015-03-02 MED ORDER — ACETAMINOPHEN 325 MG PO TABS
650.0000 mg | ORAL_TABLET | Freq: Once | ORAL | Status: AC
  Administered 2015-03-02: 650 mg via ORAL

## 2015-03-02 MED ORDER — SODIUM CHLORIDE 0.9 % IV SOLN
Freq: Once | INTRAVENOUS | Status: AC
  Administered 2015-03-02: 08:00:00 via INTRAVENOUS
  Filled 2015-03-02: qty 8

## 2015-03-02 MED ORDER — SODIUM CHLORIDE 0.9 % IV SOLN
Freq: Once | INTRAVENOUS | Status: AC
  Administered 2015-03-02: 08:00:00 via INTRAVENOUS

## 2015-03-02 MED ORDER — SODIUM CHLORIDE 0.9 % IJ SOLN
10.0000 mL | INTRAMUSCULAR | Status: DC | PRN
  Administered 2015-03-02: 10 mL
  Filled 2015-03-02: qty 10

## 2015-03-02 MED ORDER — HEPARIN SOD (PORK) LOCK FLUSH 100 UNIT/ML IV SOLN
500.0000 [IU] | Freq: Once | INTRAVENOUS | Status: AC | PRN
  Administered 2015-03-02: 500 [IU]
  Filled 2015-03-02: qty 5

## 2015-03-02 MED ORDER — DIPHENHYDRAMINE HCL 25 MG PO CAPS
50.0000 mg | ORAL_CAPSULE | Freq: Once | ORAL | Status: AC
  Administered 2015-03-02: 50 mg via ORAL

## 2015-03-02 MED ORDER — SODIUM CHLORIDE 0.9 % IV SOLN
375.0000 mg/m2 | Freq: Once | INTRAVENOUS | Status: AC
  Administered 2015-03-02: 700 mg via INTRAVENOUS
  Filled 2015-03-02: qty 70

## 2015-03-02 MED ORDER — DOXORUBICIN HCL CHEMO IV INJECTION 2 MG/ML
50.0000 mg/m2 | Freq: Once | INTRAVENOUS | Status: AC
  Administered 2015-03-02: 88 mg via INTRAVENOUS
  Filled 2015-03-02: qty 44

## 2015-03-02 MED ORDER — DIPHENHYDRAMINE HCL 25 MG PO CAPS
ORAL_CAPSULE | ORAL | Status: AC
  Filled 2015-03-02: qty 2

## 2015-03-02 MED ORDER — SODIUM CHLORIDE 0.9 % IV SOLN
750.0000 mg/m2 | Freq: Once | INTRAVENOUS | Status: AC
  Administered 2015-03-02: 1320 mg via INTRAVENOUS
  Filled 2015-03-02: qty 66

## 2015-03-02 MED ORDER — VINCRISTINE SULFATE CHEMO INJECTION 1 MG/ML
2.0000 mg | Freq: Once | INTRAVENOUS | Status: AC
  Administered 2015-03-02: 2 mg via INTRAVENOUS
  Filled 2015-03-02: qty 2

## 2015-03-02 NOTE — Progress Notes (Signed)
[facility] OFFICE PROGRESS NOTE  Patient Care Team: Jani Gravel, MD as PCP - General (Internal Medicine) [name], MD as Consulting Physician (Hematology and Oncology) [name], MD as Consulting Physician (Otolaryngology)  SUMMARY OF ONCOLOGIC HISTORY:   Diffuse large B cell lymphoma   01/31/2015 Imaging CT scan of the neck showed large mass centered around the right parapharyngeal space, measuring approximately 3.6 cm with bulky lymphadenopathy on the right side. Incidentally 2 nodules were found.   02/05/2015 Pathology Results Accession: [id number] biopsy confirmed diffuse large B-cell lymphoma   02/05/2015 Surgery She had excisional lymph node biopsy of the right neck mass   02/16/2015 Imaging  echocardiogram showed preserved ejection fraction.   02/21/2015 Imaging  she had PET CT scan which showed stage II disease.   02/26/2015 Procedure  she had placement of Infuse-a-Port.   03/01/2015 -  Chemotherapy  she received R-CHOP chemotherapy with IT methotrexate     INTERVAL HISTORY: Please see below for problem oriented charting.  She is seen to review treatment recommendation. She is ready to start treatment.  she had port placement without complication.  REVIEW OF SYSTEMS:   Constitutional: Denies fevers, chills or abnormal weight loss Eyes: Denies blurriness of vision Ears, nose, mouth, throat, and face: Denies mucositis or sore throat Respiratory: Denies cough, dyspnea or wheezes Cardiovascular: Denies palpitation, chest discomfort or lower extremity swelling Gastrointestinal:  Denies nausea, heartburn or change in bowel habits Skin: Denies abnormal skin rashes Lymphatics: Denies new lymphadenopathy or easy bruising Neurological:Denies numbness, tingling or new weaknesses Behavioral/Psych: Mood is stable, no new changes  All other systems were reviewed with the patient and are negative.  I have reviewed the past medical history, past surgical history, social history and  family history with the patient and they are unchanged from previous note.  ALLERGIES:  is allergic to erythromycin; hydrocodone; peanut-containing drug products; and biaxin.  MEDICATIONS:  Current Outpatient Prescriptions  Medication Sig Dispense Refill  . allopurinol (ZYLOPRIM) 300 MG tablet Take 1 tablet (300 mg total) by mouth daily. 30 tablet 3  . carboxymethylcellulose 1 % ophthalmic solution 1 drop 3 (three) times daily. Refresh    . lidocaine-prilocaine (EMLA) cream Apply to affected area once 30 g 3  . ondansetron (ZOFRAN) 8 MG tablet Take 1 tablet (8 mg total) by mouth every 8 (eight) hours as needed. 30 tablet 1  . predniSONE (DELTASONE) 50 MG tablet Take 1 tablet (50 mg total) by mouth daily. Take on days 2-5 of chemotherapy. 12 tablet 0  . PRESCRIPTION MEDICATION Place 4 drops into the right ear daily as needed (tube in ear for drainage.).     Marland Kitchen prochlorperazine (COMPAZINE) 10 MG tablet Take 1 tablet (10 mg total) by mouth every 6 (six) hours as needed (Nausea or vomiting). 30 tablet 6   No current facility-administered medications for this visit.   Facility-Administered Medications Ordered in Other Visits  Medication Dose Route Frequency Provider Last Rate Last Dose  . heparin lock flush 100 unit/mL  500 Units Intracatheter Once PRN [name], MD      . sodium chloride 0.9 % injection 10 mL  10 mL Intracatheter PRN [name], MD        PHYSICAL EXAMINATION: ECOG PERFORMANCE STATUS: 0 - Asymptomatic GENERAL:alert, no distress and comfortable SKIN: skin color, texture, turgor are normal, no rashes or significant lesions EYES: normal, Conjunctiva are pink and non-injected, sclera clear Musculoskeletal:no cyanosis of digits and no clubbing  NEURO: alert & oriented x  3 with fluent speech, no focal motor/sensory deficits  LABORATORY DATA:  I have reviewed the data as listed    Component Value Date/Time   NA 137 02/26/2015 1330   K 3.7 02/26/2015 1330   CL 102 02/26/2015  1330   CO2 29 02/26/2015 1330   GLUCOSE 79 02/26/2015 1330   BUN 9 02/26/2015 1330   CREATININE 0.72 02/26/2015 1330   CREATININE 0.65 01/08/2015 0946   CALCIUM 9.0 02/26/2015 1330   PROT 6.9 02/19/2015 0720   ALBUMIN 3.9 02/19/2015 0720   AST 24 02/19/2015 0720   ALT 19 02/19/2015 0720   ALKPHOS 55 02/19/2015 0720   BILITOT 0.5 02/19/2015 0720   GFRNONAA 87* 02/26/2015 1330   GFRAA >90 02/26/2015 1330    No results found for: SPEP, UPEP  Lab Results  Component Value Date   WBC 2.6* 02/26/2015   NEUTROABS 0.9* 02/19/2015   HGB 12.1 02/26/2015   HCT 36.1 02/26/2015   MCV 88.5 02/26/2015   PLT 244 02/26/2015      Chemistry      Component Value Date/Time   NA 137 02/26/2015 1330   K 3.7 02/26/2015 1330   CL 102 02/26/2015 1330   CO2 29 02/26/2015 1330   BUN 9 02/26/2015 1330   CREATININE 0.72 02/26/2015 1330   CREATININE 0.65 01/08/2015 0946      Component Value Date/Time   CALCIUM 9.0 02/26/2015 1330   ALKPHOS 55 02/19/2015 0720   AST 24 02/19/2015 0720   ALT 19 02/19/2015 0720   BILITOT 0.5 02/19/2015 0720      ASSESSMENT & PLAN:  Diffuse large B cell lymphoma  The patient is ready to start treatment. She has no further concerns.  As part of her treatment program, due to the location of lymph nodes involved, I recommend intra-thecal chemotherapy as CNS prophylaxis.  She agreed to proceed with that treatment recommendation. Diagnostic Lumbar Puncture and Intrathecal administration of chemotherapy Procedure Note   Informed consent was obtained and potential risks including bleeding, infection and pain were reviewed with the patient.  The patient's name, date of birth, identification, consent and allergies were verified prior to the start of procedure and time out was performed.  The skin was prepped with Betadine solution.   5 cc of 1% lidocaine was used to provide local anaesthesia.   The L3/L4 intrathecal space was chosen as the site of procedure.  5 cc  of clear CSF was obtained.  Next, 5 ml/12 mg methotrexate was administered into the intrathecal space.  The procedure was tolerated well and there were no complications.  The patient was stable at the end of the procedure.  Specimens sent for cell count, protein and cytology to rule out lymphoma.     Leukopenia This is chronic in nature, likely constitutional. We will proceed with treatment without dose adjustment. She will receive Neulasta for coverage    Orders Placed This Encounter  Procedures  . CSF cell count with differential    Standing Status: Future     Number of Occurrences: 1     Standing Expiration Date: 04/04/2016  . Glucose, CSF    Standing Status: Future     Number of Occurrences: 1     Standing Expiration Date: 04/04/2016  . Protein, CSF    Standing Status: Future     Number of Occurrences: 1     Standing Expiration Date: 04/04/2016  . Flow Cytometry    CSF for lymphoma    Standing Status:  Future     Number of Occurrences: 1     Standing Expiration Date: 04/04/2016   All questions were answered. The patient knows to call the clinic with any problems, questions or concerns. No barriers to learning was detected. I spent 30 minutes counseling the patient face to face. The total time spent in the appointment was 30 minutes and more than 50% was on counseling and review of test results     [facility], [place], MD 03/02/2015 10:40 AM

## 2015-03-02 NOTE — Assessment & Plan Note (Signed)
The patient is ready to start treatment. She has no further concerns.  As part of her treatment program, due to the location of lymph nodes involved, I recommend intra-thecal chemotherapy as CNS prophylaxis.  She agreed to proceed with that treatment recommendation. Diagnostic Lumbar Puncture and Intrathecal administration of chemotherapy Procedure Note   Informed consent was obtained and potential risks including bleeding, infection and pain were reviewed with the patient.  The patient's name, date of birth, identification, consent and allergies were verified prior to the start of procedure and time out was performed.  The skin was prepped with Betadine solution.   5 cc of 1% lidocaine was used to provide local anaesthesia.   The L3/L4 intrathecal space was chosen as the site of procedure.  5 cc of clear CSF was obtained.  Next, 5 ml/12 mg methotrexate was administered into the intrathecal space.  The procedure was tolerated well and there were no complications.  The patient was stable at the end of the procedure.  Specimens sent for cell count, protein and cytology to rule out lymphoma.

## 2015-03-02 NOTE — Patient Instructions (Signed)
[facility] Discharge Instructions for Patients Receiving Chemotherapy  Today you received the following chemotherapy agents: Doxorubicin (push), Cytoxan, Vincristine, Rituxan  To help prevent nausea and vomiting after your treatment, we encourage you to take your nausea medication as prescribed by your physician: Compazine 10 mg every 6 hrs as needed for nausea (may take tonight at bedtime to prevent nausea, then every 6 hrs as needed); Zofran 8 mg every 8 hrs as needed for nausea.    If you develop nausea and vomiting that is not controlled by your nausea medication, call the clinic.   BELOW ARE SYMPTOMS THAT SHOULD BE REPORTED IMMEDIATELY:  *FEVER GREATER THAN 100.5 F  *CHILLS WITH OR WITHOUT FEVER  NAUSEA AND VOMITING THAT IS NOT CONTROLLED WITH YOUR NAUSEA MEDICATION  *UNUSUAL SHORTNESS OF BREATH  *UNUSUAL BRUISING OR BLEEDING  TENDERNESS IN MOUTH AND THROAT WITH OR WITHOUT PRESENCE OF ULCERS  *URINARY PROBLEMS  *BOWEL PROBLEMS  UNUSUAL RASH Items with * indicate a potential emergency and should be followed up as soon as possible.  Feel free to call the clinic you have any questions or concerns. The clinic phone number is [phone].  Doxorubicin injection What is this medicine? DOXORUBICIN (dox oh ROO bi sin) is a chemotherapy drug. It is used to treat many kinds of cancer like Hodgkin's disease, leukemia, non-Hodgkin's lymphoma, neuroblastoma, sarcoma, and Wilms' tumor. It is also used to treat bladder cancer, breast cancer, lung cancer, ovarian cancer, stomach cancer, and thyroid cancer. This medicine may be used for other purposes; ask your health care provider or pharmacist if you have questions. COMMON BRAND NAME(S): Adriamycin, Adriamycin PFS, Adriamycin RDF, Rubex What should I tell my health care provider before I take this medicine? They need to know if you have any of these conditions: -blood disorders -heart disease, recent heart  attack -infection (especially a virus infection such as chickenpox, cold sores, or herpes) -irregular heartbeat -liver disease -recent or ongoing radiation therapy -an unusual or allergic reaction to doxorubicin, other chemotherapy agents, other medicines, foods, dyes, or preservatives -pregnant or trying to get pregnant -breast-feeding How should I use this medicine? This drug is given as an infusion into a vein. It is administered in a hospital or clinic by a specially trained health care professional. If you have pain, swelling, burning or any unusual feeling around the site of your injection, tell your health care professional right away. Talk to your pediatrician regarding the use of this medicine in children. Special care may be needed. Overdosage: If you think you have taken too much of this medicine contact a poison control center or emergency room at once. NOTE: This medicine is only for you. Do not share this medicine with others. What if I miss a dose? It is important not to miss your dose. Call your doctor or health care professional if you are unable to keep an appointment. What may interact with this medicine? Do not take this medicine with any of the following medications: -cisapride -droperidol -halofantrine -pimozide -zidovudine This medicine may also interact with the following medications: -chloroquine -chlorpromazine -clarithromycin -cyclophosphamide -cyclosporine -erythromycin -medicines for depression, anxiety, or psychotic disturbances -medicines for irregular heart beat like amiodarone, bepridil, dofetilide, encainide, flecainide, propafenone, quinidine -medicines for seizures like ethotoin, fosphenytoin, phenytoin -medicines for nausea, vomiting like dolasetron, ondansetron, palonosetron -medicines to increase blood counts like filgrastim, pegfilgrastim, sargramostim -methadone -methotrexate -pentamidine -progesterone -vaccines -verapamil Talk to your  doctor or health care professional before taking any of these medicines: -acetaminophen -  aspirin -ibuprofen -ketoprofen -naproxen This list may not describe all possible interactions. Give your health care provider a list of all the medicines, herbs, non-prescription drugs, or dietary supplements you use. Also tell them if you smoke, drink alcohol, or use illegal drugs. Some items may interact with your medicine. What should I watch for while using this medicine? Your condition will be monitored carefully while you are receiving this medicine. You will need important blood work done while you are taking this medicine. This drug may make you feel generally unwell. This is not uncommon, as chemotherapy can affect healthy cells as well as cancer cells. Report any side effects. Continue your course of treatment even though you feel ill unless your doctor tells you to stop. Your urine may turn red for a few days after your dose. This is not blood. If your urine is dark or brown, call your doctor. In some cases, you may be given additional medicines to help with side effects. Follow all directions for their use. Call your doctor or health care professional for advice if you get a fever, chills or sore throat, or other symptoms of a cold or flu. Do not treat yourself. This drug decreases your body's ability to fight infections. Try to avoid being around people who are sick. This medicine may increase your risk to bruise or bleed. Call your doctor or health care professional if you notice any unusual bleeding. Be careful brushing and flossing your teeth or using a toothpick because you may get an infection or bleed more easily. If you have any dental work done, tell your dentist you are receiving this medicine. Avoid taking products that contain aspirin, acetaminophen, ibuprofen, naproxen, or ketoprofen unless instructed by your doctor. These medicines may hide a fever. Men and women of childbearing age  should use effective birth control methods while using taking this medicine. Do not become pregnant while taking this medicine. There is a potential for serious side effects to an unborn child. Talk to your health care professional or pharmacist for more information. Do not breast-feed an infant while taking this medicine. Do not let others touch your urine or other body fluids for 5 days after each treatment with this medicine. Caregivers should wear latex gloves to avoid touching body fluids during this time. There is a maximum amount of this medicine you should receive throughout your life. The amount depends on the medical condition being treated and your overall health. Your doctor will watch how much of this medicine you receive in your lifetime. Tell your doctor if you have taken this medicine before. What side effects may I notice from receiving this medicine? Side effects that you should report to your doctor or health care professional as soon as possible: -allergic reactions like skin rash, itching or hives, swelling of the face, lips, or tongue -low blood counts - this medicine may decrease the number of white blood cells, red blood cells and platelets. You may be at increased risk for infections and bleeding. -signs of infection - fever or chills, cough, sore throat, pain or difficulty passing urine -signs of decreased platelets or bleeding - bruising, pinpoint red spots on the skin, black, tarry stools, blood in the urine -signs of decreased red blood cells - unusually weak or tired, fainting spells, lightheadedness -breathing problems -chest pain -fast, irregular heartbeat -mouth sores -nausea, vomiting -pain, swelling, redness at site where injected -pain, tingling, numbness in the hands or feet -swelling of ankles, feet, or hands -unusual bleeding  or bruising Side effects that usually do not require medical attention (report to your doctor or health care professional if they  continue or are bothersome): -diarrhea -facial flushing -hair loss -loss of appetite -missed menstrual periods -nail discoloration or damage -red or watery eyes -red colored urine -stomach upset This list may not describe all possible side effects. Call your doctor for medical advice about side effects. You may report side effects to FDA at 1-800-FDA-1088. Where should I keep my medicine? This drug is given in a hospital or clinic and will not be stored at home. NOTE: This sheet is a summary. It may not cover all possible information. If you have questions about this medicine, talk to your doctor, pharmacist, or health care provider.  2015, [employer]. (2013-04-05 09:54:34)  Vincristine injection What is this medicine? VINCRISTINE (vin KRIS teen) is a chemotherapy drug. It slows the growth of cancer cells. This medicine is used to treat many types of cancer like Hodgkin's disease, leukemia, non-Hodgkin's lymphoma, neuroblastoma (brain cancer), rhabdomyosarcoma, and Wilms' tumor. This medicine may be used for other purposes; ask your health care provider or pharmacist if you have questions. COMMON BRAND NAME(S): Oncovin, Vincasar PFS What should I tell my health care provider before I take this medicine? They need to know if you have any of these conditions: -blood disorders -gout -infection (especially chickenpox, cold sores, or herpes) -kidney disease -liver disease -lung disease -nervous system disease like Charcot-Marie-Tooth (CMT) -recent or ongoing radiation therapy -an unusual or allergic reaction to vincristine, other chemotherapy agents, other medicines, foods, dyes, or preservatives -pregnant or trying to get pregnant -breast-feeding How should I use this medicine? This drug is given as an infusion into a vein. It is administered in a hospital or clinic by a specially trained health care professional. If you have pain, swelling, burning, or any unusual feeling  around the site of your injection, tell your health care professional right away. Talk to your pediatrician regarding the use of this medicine in children. While this drug may be prescribed for selected conditions, precautions do apply. Overdosage: If you think you have taken too much of this medicine contact a poison control center or emergency room at once. NOTE: This medicine is only for you. Do not share this medicine with others. What if I miss a dose? It is important not to miss your dose. Call your doctor or health care professional if you are unable to keep an appointment. What may interact with this medicine? Do not take this medicine with any of the following medications: -itraconazole -mibefradil -voriconazole This medicine may also interact with the following medications: -cyclosporine -erythromycin -fluconazole -ketoconazole -medicines for HIV like delavirdine, efavirenz, nevirapine -medicines for seizures like ethotoin, fosphenotoin, phenytoin -medicines to increase blood counts like filgrastim, pegfilgrastim, sargramostim -other chemotherapy drugs like cisplatin, L-asparaginase, methotrexate, mitomycin, paclitaxel -pegaspargase -vaccines -zalcitabine, ddC Talk to your doctor or health care professional before taking any of these medicines: -acetaminophen -aspirin -ibuprofen -ketoprofen -naproxen This list may not describe all possible interactions. Give your health care provider a list of all the medicines, herbs, non-prescription drugs, or dietary supplements you use. Also tell them if you smoke, drink alcohol, or use illegal drugs. Some items may interact with your medicine. What should I watch for while using this medicine? Your condition will be monitored carefully while you are receiving this medicine. You will need important blood work done while you are taking this medicine. This drug may make you feel generally unwell. This is  not uncommon, as chemotherapy can  affect healthy cells as well as cancer cells. Report any side effects. Continue your course of treatment even though you feel ill unless your doctor tells you to stop. In some cases, you may be given additional medicines to help with side effects. Follow all directions for their use. Call your doctor or health care professional for advice if you get a fever, chills or sore throat, or other symptoms of a cold or flu. Do not treat yourself. Avoid taking products that contain aspirin, acetaminophen, ibuprofen, naproxen, or ketoprofen unless instructed by your doctor. These medicines may hide a fever. Do not become pregnant while taking this medicine. Women should inform their doctor if they wish to become pregnant or think they might be pregnant. There is a potential for serious side effects to an unborn child. Talk to your health care professional or pharmacist for more information. Do not breast-feed an infant while taking this medicine. Men may have a lower sperm count while taking this medicine. Talk to your doctor if you plan to father a child. What side effects may I notice from receiving this medicine? Side effects that you should report to your doctor or health care professional as soon as possible: -allergic reactions like skin rash, itching or hives, swelling of the face, lips, or tongue -breathing problems -confusion or changes in emotions or moods -constipation -cough -mouth sores -muscle weakness -nausea and vomiting -pain, swelling, redness or irritation at the injection site -pain, tingling, numbness in the hands or feet -problems with balance, talking, walking -seizures -stomach pain -trouble passing urine or change in the amount of urine Side effects that usually do not require medical attention (report to your doctor or health care professional if they continue or are bothersome): -diarrhea -hair loss -jaw pain -loss of appetite This list may not describe all possible side  effects. Call your doctor for medical advice about side effects. You may report side effects to FDA at 1-800-FDA-1088. Where should I keep my medicine? This drug is given in a hospital or clinic and will not be stored at home. NOTE: This sheet is a summary. It may not cover all possible information. If you have questions about this medicine, talk to your doctor, pharmacist, or health care provider.  2015, [employer] Standard. (2008-09-04 17:17:13)  Cyclophosphamide injection What is this medicine? CYCLOPHOSPHAMIDE (sye kloe FOSS fa mide) is a chemotherapy drug. It slows the growth of cancer cells. This medicine is used to treat many types of cancer like lymphoma, myeloma, leukemia, breast cancer, and ovarian cancer, to name a few. This medicine may be used for other purposes; ask your health care provider or pharmacist if you have questions. COMMON BRAND NAME(S): Cytoxan, Neosar What should I tell my health care provider before I take this medicine? They need to know if you have any of these conditions: -blood disorders -history of other chemotherapy -infection -kidney disease -liver disease -recent or ongoing radiation therapy -tumors in the bone marrow -an unusual or allergic reaction to cyclophosphamide, other chemotherapy, other medicines, foods, dyes, or preservatives -pregnant or trying to get pregnant -breast-feeding How should I use this medicine? This drug is usually given as an injection into a vein or muscle or by infusion into a vein. It is administered in a hospital or clinic by a specially trained health care professional. Talk to your pediatrician regarding the use of this medicine in children. Special care may be needed. Overdosage: If you think you have taken too  much of this medicine contact a poison control center or emergency room at once. NOTE: This medicine is only for you. Do not share this medicine with others. What if I miss a dose? It is important not to  miss your dose. Call your doctor or health care professional if you are unable to keep an appointment. What may interact with this medicine? This medicine may interact with the following medications: -amiodarone -amphotericin B -azathioprine -certain antiviral medicines for HIV or AIDS such as protease inhibitors (e.g., indinavir, ritonavir) and zidovudine -certain blood pressure medications such as benazepril, captopril, enalapril, fosinopril, lisinopril, moexipril, monopril, perindopril, quinapril, ramipril, trandolapril -certain cancer medications such as anthracyclines (e.g., daunorubicin, doxorubicin), busulfan, cytarabine, paclitaxel, pentostatin, tamoxifen, trastuzumab -certain diuretics such as chlorothiazide, chlorthalidone, hydrochlorothiazide, indapamide, metolazone -certain medicines that treat or prevent blood clots like warfarin -certain muscle relaxants such as succinylcholine -cyclosporine -etanercept -indomethacin -medicines to increase blood counts like filgrastim, pegfilgrastim, sargramostim -medicines used as general anesthesia -metronidazole -natalizumab This list may not describe all possible interactions. Give your health care provider a list of all the medicines, herbs, non-prescription drugs, or dietary supplements you use. Also tell them if you smoke, drink alcohol, or use illegal drugs. Some items may interact with your medicine. What should I watch for while using this medicine? Visit your doctor for checks on your progress. This drug may make you feel generally unwell. This is not uncommon, as chemotherapy can affect healthy cells as well as cancer cells. Report any side effects. Continue your course of treatment even though you feel ill unless your doctor tells you to stop. Drink water or other fluids as directed. Urinate often, even at night. In some cases, you may be given additional medicines to help with side effects. Follow all directions for their use. Call  your doctor or health care professional for advice if you get a fever, chills or sore throat, or other symptoms of a cold or flu. Do not treat yourself. This drug decreases your body's ability to fight infections. Try to avoid being around people who are sick. This medicine may increase your risk to bruise or bleed. Call your doctor or health care professional if you notice any unusual bleeding. Be careful brushing and flossing your teeth or using a toothpick because you may get an infection or bleed more easily. If you have any dental work done, tell your dentist you are receiving this medicine. You may get drowsy or dizzy. Do not drive, use machinery, or do anything that needs mental alertness until you know how this medicine affects you. Do not become pregnant while taking this medicine or for 1 year after stopping it. Women should inform their doctor if they wish to become pregnant or think they might be pregnant. Men should not father a child while taking this medicine and for 4 months after stopping it. There is a potential for serious side effects to an unborn child. Talk to your health care professional or pharmacist for more information. Do not breast-feed an infant while taking this medicine. This medicine may interfere with the ability to have a child. This medicine has caused ovarian failure in some women. This medicine has caused reduced sperm counts in some men. You should talk with your doctor or health care professional if you are concerned about your fertility. If you are going to have surgery, tell your doctor or health care professional that you have taken this medicine. What side effects may I notice from receiving this medicine? Side  effects that you should report to your doctor or health care professional as soon as possible: -allergic reactions like skin rash, itching or hives, swelling of the face, lips, or tongue -low blood counts - this medicine may decrease the number of white  blood cells, red blood cells and platelets. You may be at increased risk for infections and bleeding. -signs of infection - fever or chills, cough, sore throat, pain or difficulty passing urine -signs of decreased platelets or bleeding - bruising, pinpoint red spots on the skin, black, tarry stools, blood in the urine -signs of decreased red blood cells - unusually weak or tired, fainting spells, lightheadedness -breathing problems -dark urine -dizziness -palpitations -swelling of the ankles, feet, hands -trouble passing urine or change in the amount of urine -weight gain -yellowing of the eyes or skin Side effects that usually do not require medical attention (report to your doctor or health care professional if they continue or are bothersome): -changes in nail or skin color -hair loss -missed menstrual periods -mouth sores -nausea, vomiting This list may not describe all possible side effects. Call your doctor for medical advice about side effects. You may report side effects to FDA at 1-800-FDA-1088. Where should I keep my medicine? This drug is given in a hospital or clinic and will not be stored at home. NOTE: This sheet is a summary. It may not cover all possible information. If you have questions about this medicine, talk to your doctor, pharmacist, or health care provider.  2015, [employer] Standard. (2012-10-22 16:22:58)  Rituximab injection What is this medicine? RITUXIMAB (ri TUX i mab) is a monoclonal antibody. This medicine changes the way the body's immune system works. It is used commonly to treat non-Hodgkin's lymphoma and other conditions. In cancer cells, this drug targets a specific protein within cancer cells and stops the cancer cells from growing. It is also used to treat rhuematoid arthritis (RA). In RA, this medicine slow the inflammatory process and help reduce joint pain and swelling. This medicine is often used with other cancer or arthritis  medications. This medicine may be used for other purposes; ask your health care provider or pharmacist if you have questions. COMMON BRAND NAME(S): Rituxan What should I tell my health care provider before I take this medicine? They need to know if you have any of these conditions: -blood disorders -heart disease -history of hepatitis B -infection (especially a virus infection such as chickenpox, cold sores, or herpes) -irregular heartbeat -kidney disease -lung or breathing disease, like asthma -lupus -an unusual or allergic reaction to rituximab, mouse proteins, other medicines, foods, dyes, or preservatives -pregnant or trying to get pregnant -breast-feeding How should I use this medicine? This medicine is for infusion into a vein. It is administered in a hospital or clinic by a specially trained health care professional. A special MedGuide will be given to you by the pharmacist with each prescription and refill. Be sure to read this information carefully each time. Talk to your pediatrician regarding the use of this medicine in children. This medicine is not approved for use in children. Overdosage: If you think you have taken too much of this medicine contact a poison control center or emergency room at once. NOTE: This medicine is only for you. Do not share this medicine with others. What if I miss a dose? It is important not to miss a dose. Call your doctor or health care professional if you are unable to keep an appointment. What may interact with this  medicine? -cisplatin -medicines for blood pressure -some other medicines for arthritis -vaccines This list may not describe all possible interactions. Give your health care provider a list of all the medicines, herbs, non-prescription drugs, or dietary supplements you use. Also tell them if you smoke, drink alcohol, or use illegal drugs. Some items may interact with your medicine. What should I watch for while using this  medicine? Report any side effects that you notice during your treatment right away, such as changes in your breathing, fever, chills, dizziness or lightheadedness. These effects are more common with the first dose. Visit your prescriber or health care professional for checks on your progress. You will need to have regular blood work. Report any other side effects. The side effects of this medicine can continue after you finish your treatment. Continue your course of treatment even though you feel ill unless your doctor tells you to stop. Call your doctor or health care professional for advice if you get a fever, chills or sore throat, or other symptoms of a cold or flu. Do not treat yourself. This drug decreases your body's ability to fight infections. Try to avoid being around people who are sick. This medicine may increase your risk to bruise or bleed. Call your doctor or health care professional if you notice any unusual bleeding. Be careful brushing and flossing your teeth or using a toothpick because you may get an infection or bleed more easily. If you have any dental work done, tell your dentist you are receiving this medicine. Avoid taking products that contain aspirin, acetaminophen, ibuprofen, naproxen, or ketoprofen unless instructed by your doctor. These medicines may hide a fever. Do not become pregnant while taking this medicine. Women should inform their doctor if they wish to become pregnant or think they might be pregnant. There is a potential for serious side effects to an unborn child. Talk to your health care professional or pharmacist for more information. Do not breast-feed an infant while taking this medicine. What side effects may I notice from receiving this medicine? Side effects that you should report to your doctor or health care professional as soon as possible: -allergic reactions like skin rash, itching or hives, swelling of the face, lips, or tongue -low blood counts - this  medicine may decrease the number of white blood cells, red blood cells and platelets. You may be at increased risk for infections and bleeding. -signs of infection - fever or chills, cough, sore throat, pain or difficulty passing urine -signs of decreased platelets or bleeding - bruising, pinpoint red spots on the skin, black, tarry stools, blood in the urine -signs of decreased red blood cells - unusually weak or tired, fainting spells, lightheadedness -breathing problems -confused, not responsive -chest pain -fast, irregular heartbeat -feeling faint or lightheaded, falls -mouth sores -redness, blistering, peeling or loosening of the skin, including inside the mouth -stomach pain -swelling of the ankles, feet, or hands -trouble passing urine or change in the amount of urine Side effects that usually do not require medical attention (report to your doctor or other health care professional if they continue or are bothersome): -anxiety -headache -loss of appetite -muscle aches -nausea -night sweats This list may not describe all possible side effects. Call your doctor for medical advice about side effects. You may report side effects to FDA at 1-800-FDA-1088. Where should I keep my medicine? This drug is given in a hospital or clinic and will not be stored at home. NOTE: This sheet is a summary.  It may not cover all possible information. If you have questions about this medicine, talk to your doctor, pharmacist, or health care provider.  2015, Elsevier/Gold Standard. (2008-08-07 14:04:59)

## 2015-03-02 NOTE — Assessment & Plan Note (Signed)
This is chronic in nature, likely constitutional. We will proceed with treatment without dose adjustment. She will receive Neulasta for coverage

## 2015-03-02 NOTE — Progress Notes (Signed)
Pt tolerated 1st RCHOP well, no reactions. AVS and discharge instructions reviewed with pt, daughter, and husband. Educated on drinking decaffeinated fluids, at least 64 oz to prevent dehydration, and reminded that fatigue will occur along with urine turning red today. Pt voices understanding of teaching.

## 2015-03-02 NOTE — Progress Notes (Signed)
Patient requesting diet education on healthy diet during treatment. 69 year old female diagnosed with B-cell lymphoma.  She is a patient of Dr. Alvy Bimler.  Past medical history includes arthritis and dentures.  Medications include Zofran, prednisone, and Compazine.  Labs were reviewed.  Height: 65 inches. Weight: 147.1 pounds. BMI: 24.48.  Patient denies nutrition impact symptoms.  Nutrition diagnosis: Food and nutrition related knowledge deficit related to diagnosis of B-cell lymphoma as evidenced by no prior need for nutrition related information.  Intervention:  Patient educated on healthy plant-based diet with increased protein supporting weight maintenance. Provided patient with fact sheets on high-protein diet. Encouraged patient to contact me if she develops nutrition impact symptoms. Teach back method used and contact information was given.  Monitoring, evaluation, goals: Patient will tolerate a healthy plant-based diet to maintain weight throughout treatment.  Nutrition diagnosis resolved.  **Disclaimer: This note was dictated with voice recognition software. Similar sounding words can inadvertently be transcribed and this note may contain transcription errors which may not have been corrected upon publication of note.**

## 2015-03-05 ENCOUNTER — Ambulatory Visit (HOSPITAL_BASED_OUTPATIENT_CLINIC_OR_DEPARTMENT_OTHER): Payer: Medicare Other

## 2015-03-05 ENCOUNTER — Telehealth: Payer: Self-pay | Admitting: *Deleted

## 2015-03-05 DIAGNOSIS — C8330 Diffuse large B-cell lymphoma, unspecified site: Secondary | ICD-10-CM

## 2015-03-05 DIAGNOSIS — Z5189 Encounter for other specified aftercare: Secondary | ICD-10-CM | POA: Diagnosis not present

## 2015-03-05 DIAGNOSIS — C8331 Diffuse large B-cell lymphoma, lymph nodes of head, face, and neck: Secondary | ICD-10-CM | POA: Diagnosis not present

## 2015-03-05 MED ORDER — PEGFILGRASTIM INJECTION 6 MG/0.6ML ~~LOC~~
6.0000 mg | PREFILLED_SYRINGE | Freq: Once | SUBCUTANEOUS | Status: AC
  Administered 2015-03-05: 6 mg via SUBCUTANEOUS
  Filled 2015-03-05: qty 0.6

## 2015-03-05 NOTE — Telephone Encounter (Signed)
Patient called to say another patient in the lobby told her Neulasta causes mouth sores and that she should use baking soda and salt rinses.  Advised her to continue to use her Biotene and to call us if she should develop any mouth sores and that Neulasta does not cause mouth sores.  She agreed to do so.

## 2015-03-05 NOTE — Patient Instructions (Signed)
Pegfilgrastim injection What is this medicine? PEGFILGRASTIM (peg fil GRA stim) is a long-acting granulocyte colony-stimulating factor that stimulates the growth of neutrophils, a type of white blood cell important in the body's fight against infection. It is used to reduce the incidence of fever and infection in patients with certain types of cancer who are receiving chemotherapy that affects the bone marrow. This medicine may be used for other purposes; ask your health care provider or pharmacist if you have questions. COMMON BRAND NAME(S): Neulasta What should I tell my health care provider before I take this medicine? They need to know if you have any of these conditions: -latex allergy -ongoing radiation therapy -sickle cell disease -skin reactions to acrylic adhesives (On-Body Injector only) -an unusual or allergic reaction to pegfilgrastim, filgrastim, other medicines, foods, dyes, or preservatives -pregnant or trying to get pregnant -breast-feeding How should I use this medicine? This medicine is for injection under the skin. If you get this medicine at home, you will be taught how to prepare and give the pre-filled syringe or how to use the On-body Injector. Refer to the patient Instructions for Use for detailed instructions. Use exactly as directed. Take your medicine at regular intervals. Do not take your medicine more often than directed. It is important that you put your used needles and syringes in a special sharps container. Do not put them in a trash can. If you do not have a sharps container, call your pharmacist or healthcare provider to get one. Talk to your pediatrician regarding the use of this medicine in children. Special care may be needed. Overdosage: If you think you have taken too much of this medicine contact a poison control center or emergency room at once. NOTE: This medicine is only for you. Do not share this medicine with others. What if I miss a dose? It is  important not to miss your dose. Call your doctor or health care professional if you miss your dose. If you miss a dose due to an On-body Injector failure or leakage, a new dose should be administered as soon as possible using a single prefilled syringe for manual use. What may interact with this medicine? Interactions have not been studied. Give your health care provider a list of all the medicines, herbs, non-prescription drugs, or dietary supplements you use. Also tell them if you smoke, drink alcohol, or use illegal drugs. Some items may interact with your medicine. This list may not describe all possible interactions. Give your health care provider a list of all the medicines, herbs, non-prescription drugs, or dietary supplements you use. Also tell them if you smoke, drink alcohol, or use illegal drugs. Some items may interact with your medicine. What should I watch for while using this medicine? You may need blood work done while you are taking this medicine. If you are going to need a MRI, CT scan, or other procedure, tell your doctor that you are using this medicine (On-Body Injector only). What side effects may I notice from receiving this medicine? Side effects that you should report to your doctor or health care professional as soon as possible: -allergic reactions like skin rash, itching or hives, swelling of the face, lips, or tongue -dizziness -fever -pain, redness, or irritation at site where injected -pinpoint red spots on the skin -shortness of breath or breathing problems -stomach or side pain, or pain at the shoulder -swelling -tiredness -trouble passing urine Side effects that usually do not require medical attention (report to your doctor   or health care professional if they continue or are bothersome): -bone pain -muscle pain This list may not describe all possible side effects. Call your doctor for medical advice about side effects. You may report side effects to FDA at  1-800-FDA-1088. Where should I keep my medicine? Keep out of the reach of children. Store pre-filled syringes in a refrigerator between 2 and 8 degrees C (36 and 46 degrees F). Do not freeze. Keep in carton to protect from light. Throw away this medicine if it is left out of the refrigerator for more than 48 hours. Throw away any unused medicine after the expiration date. NOTE: This sheet is a summary. It may not cover all possible information. If you have questions about this medicine, talk to your doctor, pharmacist, or health care provider.  2015, Elsevier/Gold Standard. (2014-03-09 16:14:05)  

## 2015-03-05 NOTE — Telephone Encounter (Signed)
No addition notes found

## 2015-03-06 ENCOUNTER — Encounter (HOSPITAL_COMMUNITY): Payer: Self-pay

## 2015-03-07 ENCOUNTER — Telehealth: Payer: Self-pay | Admitting: *Deleted

## 2015-03-07 LAB — TISSUE HYBRIDIZATION (BONE MARROW)-NCBH

## 2015-03-07 NOTE — Telephone Encounter (Signed)
Patient called to ask if she could dye her hair.  She received Adriamycin a week ago.  Reminded her that she will lose her hair, so she probably shouldn't dye it.  She also wanted to know if she should have her regular eye exam now.  She is having no eye problems.  Let her know that the decadron pre-meds that she gets each treatment can cause some visual changes.  Advised her to wait until she completes chemo, if she is not having problems.

## 2015-03-14 ENCOUNTER — Telehealth: Payer: Self-pay | Admitting: *Deleted

## 2015-03-14 NOTE — Telephone Encounter (Signed)
Mr. Hotard called asking if there are any precautions of body fluids or kissing when wife receives chemotherapy.  Instructed to use condoms 72 hours.  Protect from all body fluids at least 72 hours.  Flush commode twice with lid closed, kiss her cheek, neck or hand etc.

## 2015-03-16 ENCOUNTER — Telehealth: Payer: Self-pay | Admitting: *Deleted

## 2015-03-16 NOTE — Telephone Encounter (Signed)
Patient asked whether it is better to use a temporary rinse or dye to cover her grey,  She has had R-CHOP x  1.  Advised her to use a rinse.

## 2015-03-22 ENCOUNTER — Other Ambulatory Visit (HOSPITAL_BASED_OUTPATIENT_CLINIC_OR_DEPARTMENT_OTHER): Payer: Medicare Other

## 2015-03-22 ENCOUNTER — Other Ambulatory Visit: Payer: Self-pay | Admitting: Hematology and Oncology

## 2015-03-22 ENCOUNTER — Telehealth: Payer: Self-pay | Admitting: *Deleted

## 2015-03-22 ENCOUNTER — Telehealth: Payer: Self-pay | Admitting: Hematology and Oncology

## 2015-03-22 ENCOUNTER — Other Ambulatory Visit (HOSPITAL_COMMUNITY)
Admission: RE | Admit: 2015-03-22 | Discharge: 2015-03-22 | Disposition: A | Discharge status: Home / Self Care | Payer: Medicare Other | Source: Ambulatory Visit | Attending: Hematology and Oncology | Admitting: Hematology and Oncology

## 2015-03-22 ENCOUNTER — Ambulatory Visit (HOSPITAL_BASED_OUTPATIENT_CLINIC_OR_DEPARTMENT_OTHER): Payer: Medicare Other | Admitting: Hematology and Oncology

## 2015-03-22 ENCOUNTER — Other Ambulatory Visit (HOSPITAL_COMMUNITY): Admission: RE | Admit: 2015-03-22 | Payer: Self-pay | Source: Ambulatory Visit

## 2015-03-22 ENCOUNTER — Ambulatory Visit (HOSPITAL_BASED_OUTPATIENT_CLINIC_OR_DEPARTMENT_OTHER): Payer: Medicare Other

## 2015-03-22 ENCOUNTER — Ambulatory Visit: Payer: Medicare Other

## 2015-03-22 VITALS — BP 111/69 | HR 59 | Temp 97.7°F | Resp 18 | Ht 65.0 in | Wt 145.2 lb

## 2015-03-22 DIAGNOSIS — D630 Anemia in neoplastic disease: Secondary | ICD-10-CM | POA: Diagnosis not present

## 2015-03-22 DIAGNOSIS — D701 Agranulocytosis secondary to cancer chemotherapy: Secondary | ICD-10-CM

## 2015-03-22 DIAGNOSIS — D72819 Decreased white blood cell count, unspecified: Secondary | ICD-10-CM

## 2015-03-22 DIAGNOSIS — C8330 Diffuse large B-cell lymphoma, unspecified site: Secondary | ICD-10-CM

## 2015-03-22 DIAGNOSIS — Z95828 Presence of other vascular implants and grafts: Secondary | ICD-10-CM

## 2015-03-22 DIAGNOSIS — T451X5A Adverse effect of antineoplastic and immunosuppressive drugs, initial encounter: Secondary | ICD-10-CM

## 2015-03-22 LAB — CBC WITH DIFFERENTIAL/PLATELET
BASO%: 0.2 % (ref 0.0–2.0)
Basophils Absolute: 0 10*3/uL (ref 0.0–0.1)
EOS%: 1.6 % (ref 0.0–7.0)
Eosinophils Absolute: 0 10*3/uL (ref 0.0–0.5)
HCT: 32.8 % — ABNORMAL LOW (ref 34.8–46.6)
HGB: 10.7 g/dL — ABNORMAL LOW (ref 11.6–15.9)
LYMPH%: 40.2 % (ref 14.0–49.7)
MCH: 28.8 pg (ref 25.1–34.0)
MCHC: 32.7 g/dL (ref 31.5–36.0)
MCV: 88.1 fL (ref 79.5–101.0)
MONO#: 0.4 10*3/uL (ref 0.1–0.9)
MONO%: 20 % — ABNORMAL HIGH (ref 0.0–14.0)
NEUT#: 0.7 10*3/uL — ABNORMAL LOW (ref 1.5–6.5)
NEUT%: 38 % — ABNORMAL LOW (ref 38.4–76.8)
Platelets: 258 10*3/uL (ref 145–400)
RBC: 3.73 10*6/uL (ref 3.70–5.45)
RDW: 14.6 % — ABNORMAL HIGH (ref 11.2–14.5)
WBC: 2 10*3/uL — ABNORMAL LOW (ref 3.9–10.3)
lymph#: 0.8 10*3/uL — ABNORMAL LOW (ref 0.9–3.3)

## 2015-03-22 LAB — COMPREHENSIVE METABOLIC PANEL (CC13)
ALT: 18 U/L (ref 0–55)
AST: 18 U/L (ref 5–34)
Albumin: 3.7 g/dL (ref 3.5–5.0)
Alkaline Phosphatase: 71 U/L (ref 40–150)
Anion Gap: 9 mEq/L (ref 3–11)
BUN: 13.2 mg/dL (ref 7.0–26.0)
CO2: 26 mEq/L (ref 22–29)
Calcium: 8.9 mg/dL (ref 8.4–10.4)
Chloride: 106 mEq/L (ref 98–109)
Creatinine: 0.7 mg/dL (ref 0.6–1.1)
EGFR: >90 mL/min/{1.73_m2} (ref >90)
Glucose: 87 mg/dl (ref 70–140)
Potassium: 4.3 mEq/L (ref 3.5–5.1)
Sodium: 141 mEq/L (ref 136–145)
Total Bilirubin: 0.36 mg/dL (ref 0.20–1.20)
Total Protein: 6.4 g/dL (ref 6.4–8.3)

## 2015-03-22 MED ORDER — SODIUM CHLORIDE 0.9 % IJ SOLN
10.0000 mL | INTRAMUSCULAR | Status: DC | PRN
  Administered 2015-03-22: 10 mL
  Filled 2015-03-22: qty 10

## 2015-03-22 MED ORDER — HEPARIN SOD (PORK) LOCK FLUSH 100 UNIT/ML IV SOLN
500.0000 [IU] | Freq: Once | INTRAVENOUS | Status: AC | PRN
  Administered 2015-03-22: 500 [IU]
  Filled 2015-03-22: qty 5

## 2015-03-22 MED ORDER — SODIUM CHLORIDE 0.9 % IV SOLN
Freq: Once | INTRAVENOUS | Status: AC
  Administered 2015-03-22: 09:00:00 via INTRAVENOUS

## 2015-03-22 MED ORDER — SODIUM CHLORIDE 0.9 % IJ SOLN
10.0000 mL | INTRAMUSCULAR | Status: DC | PRN
  Administered 2015-03-22: 10 mL via INTRAVENOUS
  Filled 2015-03-22: qty 10

## 2015-03-22 MED ORDER — SODIUM CHLORIDE 0.9 % IJ SOLN
Freq: Once | INTRAMUSCULAR | Status: AC
  Administered 2015-03-22: 12:00:00 via INTRATHECAL
  Filled 2015-03-22: qty 0.48

## 2015-03-22 NOTE — Assessment & Plan Note (Signed)
This is likely due to recent treatment. The patient denies recent history of fevers, cough, chills, diarrhea or dysuria. She is asymptomatic from the leukopenia. I will observe for now.  I will reduce the dose chemotherapy as above but to proceed with IT methotrexate for CNS prophylaxis today.

## 2015-03-22 NOTE — Telephone Encounter (Signed)
Pt confirmed labs/ov/inj per 03/31 POF, gave pt AVS and Calendar.... KJ, sent msg to add chemo

## 2015-03-22 NOTE — Patient Instructions (Signed)
[facility] Discharge Instructions for Patients Receiving Chemotherapy  Today you received the following chemotherapy agents methotrexate.  To help prevent nausea and vomiting after your treatment, we encourage you to take your nausea medication as directed.  If you develop nausea and vomiting that is not controlled by your nausea medication, call the clinic.   BELOW ARE SYMPTOMS THAT SHOULD BE REPORTED IMMEDIATELY:  *FEVER GREATER THAN 100.5 F  *CHILLS WITH OR WITHOUT FEVER  NAUSEA AND VOMITING THAT IS NOT CONTROLLED WITH YOUR NAUSEA MEDICATION  *UNUSUAL SHORTNESS OF BREATH  *UNUSUAL BRUISING OR BLEEDING  TENDERNESS IN MOUTH AND THROAT WITH OR WITHOUT PRESENCE OF ULCERS  *URINARY PROBLEMS  *BOWEL PROBLEMS  UNUSUAL RASH Items with * indicate a potential emergency and should be followed up as soon as possible.  Feel free to call the clinic you have any questions or concerns. The clinic phone number is [phone].  Please show the [facility] at check-in to the Emergency Department and triage nurse.

## 2015-03-22 NOTE — Patient Instructions (Signed)

## 2015-03-22 NOTE — Assessment & Plan Note (Signed)
Clinically, she has excellent response to treatment. However, she has significant leukopenia. I plan to delay our CHOP chemotherapy by one week but to proceed with IT methotrexate today for CNS prophylaxis. Next week, I plan to reduce the dose of Adriamycin and Cytoxan by 25% due to leukopenia. I will continue coverage with Neulasta within 24 hours after chemotherapy to prevent risk of neutropenic fever. For cycle 3 of therapy, I will see her at the end of April and to proceed with intrathecal methotrexate as well.

## 2015-03-22 NOTE — Telephone Encounter (Signed)
Per staff message and POF I have scheduled appts. Advised scheduler of appts. JMW  

## 2015-03-22 NOTE — Telephone Encounter (Signed)
Per call from MD I have moved appts from tomorrow to next week.  JMW

## 2015-03-22 NOTE — Progress Notes (Signed)
[facility] OFFICE PROGRESS NOTE  Patient Care Team: Jani Gravel, MD as PCP - General (Internal Medicine) [name], MD as Consulting Physician (Hematology and Oncology) [name], MD as Consulting Physician (Otolaryngology)  SUMMARY OF ONCOLOGIC HISTORY:   Diffuse large B cell lymphoma   01/31/2015 Imaging CT scan of the neck showed large mass centered around the right parapharyngeal space, measuring approximately 3.6 cm with bulky lymphadenopathy on the right side. Incidentally 2 nodules were found.   02/05/2015 Pathology Results Accession: [id number] biopsy confirmed diffuse large B-cell lymphoma   02/05/2015 Surgery She had excisional lymph node biopsy of the right neck mass   02/16/2015 Imaging  echocardiogram showed preserved ejection fraction.   02/21/2015 Imaging  she had PET CT scan which showed stage II disease.   02/26/2015 Procedure  she had placement of Infuse-a-Port.   03/01/2015 -  Chemotherapy  she received R-CHOP chemotherapy with IT methotrexate   03/22/2015 Adverse Reaction Cycle 2 of RCHOP chemotherapy is delayed due to neutropenia     INTERVAL HISTORY: Please see below for problem oriented charting. She is seen prior to cycle 2 of treatment. She started to have total alopecia. She have some changes in her mood is only. She has some sensation of nausea/gastritis after chemotherapy but no vomiting. Otherwise she denies any other side effects of treatment.  REVIEW OF SYSTEMS:   Constitutional: Denies fevers, chills or abnormal weight loss Eyes: Denies blurriness of vision Ears, nose, mouth, throat, and face: Denies mucositis or sore throat Respiratory: Denies cough, dyspnea or wheezes Cardiovascular: Denies palpitation, chest discomfort or lower extremity swelling Skin: Denies abnormal skin rashes Lymphatics: Denies new lymphadenopathy or easy bruising Neurological:Denies numbness, tingling or new weaknesses Behavioral/Psych: Mood is stable, no new changes  All  other systems were reviewed with the patient and are negative.  I have reviewed the past medical history, past surgical history, social history and family history with the patient and they are unchanged from previous note.  ALLERGIES:  is allergic to erythromycin; hydrocodone; peanut-containing drug products; and biaxin.  MEDICATIONS:  Current Outpatient Prescriptions  Medication Sig Dispense Refill  . allopurinol (ZYLOPRIM) 300 MG tablet Take 1 tablet (300 mg total) by mouth daily. 30 tablet 3  . carboxymethylcellulose 1 % ophthalmic solution 1 drop 3 (three) times daily. Refresh    . lidocaine-prilocaine (EMLA) cream Apply to affected area once 30 g 3  . ondansetron (ZOFRAN) 8 MG tablet Take 1 tablet (8 mg total) by mouth every 8 (eight) hours as needed. 30 tablet 1  . predniSONE (DELTASONE) 50 MG tablet Take 1 tablet (50 mg total) by mouth daily. Take on days 2-5 of chemotherapy. 12 tablet 0  . PRESCRIPTION MEDICATION Place 4 drops into the right ear daily as needed (tube in ear for drainage.).     Marland Kitchen prochlorperazine (COMPAZINE) 10 MG tablet Take 1 tablet (10 mg total) by mouth every 6 (six) hours as needed (Nausea or vomiting). 30 tablet 6   No current facility-administered medications for this visit.   Facility-Administered Medications Ordered in Other Visits  Medication Dose Route Frequency Provider Last Rate Last Dose  . sodium chloride 0.9 % injection 10 mL  10 mL Intracatheter PRN [name], MD   10 mL at 03/22/15 1214    PHYSICAL EXAMINATION: ECOG PERFORMANCE STATUS: 1 - Symptomatic but completely ambulatory  Filed Vitals:   03/22/15 0833  BP: 111/69  Pulse: 59  Temp: 97.7 F (36.5 C)  Resp: 18   Filed  Weights   03/22/15 0833  Weight: 145 lb 3.2 oz (65.862 kg)    GENERAL:alert, no distress and comfortable SKIN: skin color, texture, turgor are normal, no rashes or significant lesions. Total alopecia is noted EYES: normal, Conjunctiva are pink and non-injected,  sclera clear OROPHARYNX:no exudate, no erythema and lips, buccal mucosa, and tongue normal  NECK: supple, thyroid normal size, non-tender, without nodularity LYMPH:  no palpable lymphadenopathy in the cervical, axillary or inguinal LUNGS: clear to auscultation and percussion with normal breathing effort HEART: regular rate & rhythm and no murmurs and no lower extremity edema ABDOMEN:abdomen soft, non-tender and normal bowel sounds Musculoskeletal:no cyanosis of digits and no clubbing  NEURO: alert & oriented x 3 with fluent speech, no focal motor/sensory deficits  LABORATORY DATA:  I have reviewed the data as listed    Component Value Date/Time   NA 141 03/22/2015 0806   NA 137 02/26/2015 1330   K 4.3 03/22/2015 0806   K 3.7 02/26/2015 1330   CL 102 02/26/2015 1330   CO2 26 03/22/2015 0806   CO2 29 02/26/2015 1330   GLUCOSE 87 03/22/2015 0806   GLUCOSE 79 02/26/2015 1330   BUN 13.2 03/22/2015 0806   BUN 9 02/26/2015 1330   CREATININE 0.7 03/22/2015 0806   CREATININE 0.72 02/26/2015 1330   CREATININE 0.65 01/08/2015 0946   CALCIUM 8.9 03/22/2015 0806   CALCIUM 9.0 02/26/2015 1330   PROT 6.4 03/22/2015 0806   PROT 6.9 02/19/2015 0720   ALBUMIN 3.7 03/22/2015 0806   ALBUMIN 3.9 02/19/2015 0720   AST 18 03/22/2015 0806   AST 24 02/19/2015 0720   ALT 18 03/22/2015 0806   ALT 19 02/19/2015 0720   ALKPHOS 71 03/22/2015 0806   ALKPHOS 55 02/19/2015 0720   BILITOT 0.36 03/22/2015 0806   BILITOT 0.5 02/19/2015 0720   GFRNONAA 87* 02/26/2015 1330   GFRAA >90 02/26/2015 1330    No results found for: SPEP, UPEP  Lab Results  Component Value Date   WBC 2.0* 03/22/2015   NEUTROABS 0.7* 03/22/2015   HGB 10.7* 03/22/2015   HCT 32.8* 03/22/2015   MCV 88.1 03/22/2015   PLT 258 03/22/2015      Chemistry      Component Value Date/Time   NA 141 03/22/2015 0806   NA 137 02/26/2015 1330   K 4.3 03/22/2015 0806   K 3.7 02/26/2015 1330   CL 102 02/26/2015 1330   CO2 26  03/22/2015 0806   CO2 29 02/26/2015 1330   BUN 13.2 03/22/2015 0806   BUN 9 02/26/2015 1330   CREATININE 0.7 03/22/2015 0806   CREATININE 0.72 02/26/2015 1330   CREATININE 0.65 01/08/2015 0946      Component Value Date/Time   CALCIUM 8.9 03/22/2015 0806   CALCIUM 9.0 02/26/2015 1330   ALKPHOS 71 03/22/2015 0806   ALKPHOS 55 02/19/2015 0720   AST 18 03/22/2015 0806   AST 24 02/19/2015 0720   ALT 18 03/22/2015 0806   ALT 19 02/19/2015 0720   BILITOT 0.36 03/22/2015 0806   BILITOT 0.5 02/19/2015 0720      ASSESSMENT & PLAN:  Diffuse large B cell lymphoma Clinically, she has excellent response to treatment. However, she has significant leukopenia. I plan to delay our CHOP chemotherapy by one week but to proceed with IT methotrexate today for CNS prophylaxis. Next week, I plan to reduce the dose of Adriamycin and Cytoxan by 25% due to leukopenia. I will continue coverage with Neulasta within 24 hours after  chemotherapy to prevent risk of neutropenic fever. For cycle 3 of therapy, I will see her at the end of April and to proceed with intrathecal methotrexate as well.   Leukopenia due to antineoplastic chemotherapy This is likely due to recent treatment. The patient denies recent history of fevers, cough, chills, diarrhea or dysuria. She is asymptomatic from the leukopenia. I will observe for now.  I will reduce the dose chemotherapy as above but to proceed with IT methotrexate for CNS prophylaxis today.   Anemia in neoplastic disease This is likely due to recent treatment. The patient denies recent history of bleeding such as epistaxis, hematuria or hematochezia. She is asymptomatic from the anemia. I will observe for now.  She does not require transfusion now. I plan to reduce the dose chemotherapy as above.  Intrathecal administration of chemotherapy Procedure Note   Informed consent was obtained and potential risks including bleeding, infection and pain were reviewed with the  patient.  The patient's name, date of birth, identification, consent and allergies were verified prior to the start of procedure and time out was performed.  The skin was prepped with Betadine solution.   2 cc of 1% lidocaine was used to provide local anaesthesia.   The L3/L4 intrathecal space was chosen as the site of procedure.  1cc of clear CSF was obtained.  Next, 5 ml/12 mg methotrexate was administered into the intrathecal space.  The procedure was tolerated well and there were no complications.  The patient was stable at the end of the procedure.  All questions were answered. The patient knows to call the clinic with any problems, questions or concerns. No barriers to learning was detected. I spent 40 minutes counseling the patient face to face. The total time spent in the appointment was 60 minutes and more than 50% was on counseling and review of test results     [facility], [place], MD 03/22/2015 1:30 PM

## 2015-03-22 NOTE — Assessment & Plan Note (Signed)
This is likely due to recent treatment. The patient denies recent history of bleeding such as epistaxis, hematuria or hematochezia. She is asymptomatic from the anemia. I will observe for now.  She does not require transfusion now. I plan to reduce the dose chemotherapy as above.

## 2015-03-23 ENCOUNTER — Ambulatory Visit: Payer: Medicare Other

## 2015-03-23 ENCOUNTER — Ambulatory Visit: Payer: Medicare Other | Admitting: Hematology and Oncology

## 2015-03-26 ENCOUNTER — Ambulatory Visit: Payer: Medicare Other

## 2015-03-29 ENCOUNTER — Ambulatory Visit (HOSPITAL_BASED_OUTPATIENT_CLINIC_OR_DEPARTMENT_OTHER): Payer: Medicare Other

## 2015-03-29 ENCOUNTER — Other Ambulatory Visit (HOSPITAL_BASED_OUTPATIENT_CLINIC_OR_DEPARTMENT_OTHER): Payer: Medicare Other

## 2015-03-29 ENCOUNTER — Ambulatory Visit: Payer: Medicare Other

## 2015-03-29 DIAGNOSIS — C8330 Diffuse large B-cell lymphoma, unspecified site: Secondary | ICD-10-CM

## 2015-03-29 DIAGNOSIS — Z95828 Presence of other vascular implants and grafts: Secondary | ICD-10-CM

## 2015-03-29 DIAGNOSIS — Z5111 Encounter for antineoplastic chemotherapy: Secondary | ICD-10-CM | POA: Diagnosis not present

## 2015-03-29 DIAGNOSIS — Z5112 Encounter for antineoplastic immunotherapy: Secondary | ICD-10-CM | POA: Diagnosis not present

## 2015-03-29 DIAGNOSIS — C8331 Diffuse large B-cell lymphoma, lymph nodes of head, face, and neck: Secondary | ICD-10-CM

## 2015-03-29 LAB — CBC WITH DIFFERENTIAL/PLATELET
BASO%: 3.2 % — ABNORMAL HIGH (ref 0.0–2.0)
Basophils Absolute: 0.1 10*3/uL (ref 0.0–0.1)
EOS%: 0.9 % (ref 0.0–7.0)
Eosinophils Absolute: 0 10*3/uL (ref 0.0–0.5)
HCT: 33.2 % — ABNORMAL LOW (ref 34.8–46.6)
HGB: 11.1 g/dL — ABNORMAL LOW (ref 11.6–15.9)
LYMPH%: 37.8 % (ref 14.0–49.7)
MCH: 29.8 pg (ref 25.1–34.0)
MCHC: 33.4 g/dL (ref 31.5–36.0)
MCV: 89 fL (ref 79.5–101.0)
MONO#: 0.4 10*3/uL (ref 0.1–0.9)
MONO%: 17.1 % — ABNORMAL HIGH (ref 0.0–14.0)
NEUT#: 0.9 10*3/uL — ABNORMAL LOW (ref 1.5–6.5)
NEUT%: 41 % (ref 38.4–76.8)
Platelets: 257 10*3/uL (ref 145–400)
RBC: 3.73 10*6/uL (ref 3.70–5.45)
RDW: 15.3 % — ABNORMAL HIGH (ref 11.2–14.5)
WBC: 2.2 10*3/uL — ABNORMAL LOW (ref 3.9–10.3)
lymph#: 0.8 10*3/uL — ABNORMAL LOW (ref 0.9–3.3)

## 2015-03-29 LAB — COMPREHENSIVE METABOLIC PANEL (CC13)
ALT: 17 U/L (ref 0–55)
AST: 18 U/L (ref 5–34)
Albumin: 3.7 g/dL (ref 3.5–5.0)
Alkaline Phosphatase: 65 U/L (ref 40–150)
Anion Gap: 8 mEq/L (ref 3–11)
BUN: 10.2 mg/dL (ref 7.0–26.0)
CO2: 28 mEq/L (ref 22–29)
Calcium: 9 mg/dL (ref 8.4–10.4)
Chloride: 105 mEq/L (ref 98–109)
Creatinine: 0.9 mg/dL (ref 0.6–1.1)
EGFR: 82 mL/min/{1.73_m2} — ABNORMAL LOW (ref >90)
Glucose: 91 mg/dl (ref 70–140)
Potassium: 4 mEq/L (ref 3.5–5.1)
Sodium: 141 mEq/L (ref 136–145)
Total Bilirubin: 0.55 mg/dL (ref 0.20–1.20)
Total Protein: 6.4 g/dL (ref 6.4–8.3)

## 2015-03-29 MED ORDER — ACETAMINOPHEN 325 MG PO TABS
ORAL_TABLET | ORAL | Status: AC
  Filled 2015-03-29: qty 2

## 2015-03-29 MED ORDER — ACETAMINOPHEN 325 MG PO TABS
650.0000 mg | ORAL_TABLET | Freq: Once | ORAL | Status: AC
  Administered 2015-03-29: 650 mg via ORAL

## 2015-03-29 MED ORDER — SODIUM CHLORIDE 0.9 % IV SOLN
2.0000 mg | Freq: Once | INTRAVENOUS | Status: AC
  Administered 2015-03-29: 2 mg via INTRAVENOUS
  Filled 2015-03-29: qty 2

## 2015-03-29 MED ORDER — ACETAMINOPHEN 325 MG PO TABS
ORAL_TABLET | ORAL | Status: AC
Start: 2015-03-29 — End: 2015-03-29
  Filled 2015-03-29: qty 2

## 2015-03-29 MED ORDER — SODIUM CHLORIDE 0.9 % IV SOLN
562.5000 mg/m2 | Freq: Once | INTRAVENOUS | Status: AC
  Administered 2015-03-29: 980 mg via INTRAVENOUS
  Filled 2015-03-29: qty 49

## 2015-03-29 MED ORDER — SODIUM CHLORIDE 0.9 % IJ SOLN
10.0000 mL | INTRAMUSCULAR | Status: DC | PRN
  Administered 2015-03-29: 10 mL via INTRAVENOUS
  Filled 2015-03-29: qty 10

## 2015-03-29 MED ORDER — SODIUM CHLORIDE 0.9 % IV SOLN
Freq: Once | INTRAVENOUS | Status: AC
  Administered 2015-03-29: 09:00:00 via INTRAVENOUS

## 2015-03-29 MED ORDER — SODIUM CHLORIDE 0.9 % IJ SOLN
10.0000 mL | INTRAMUSCULAR | Status: DC | PRN
  Administered 2015-03-29: 10 mL
  Filled 2015-03-29: qty 10

## 2015-03-29 MED ORDER — DIPHENHYDRAMINE HCL 25 MG PO CAPS
50.0000 mg | ORAL_CAPSULE | Freq: Once | ORAL | Status: AC
  Administered 2015-03-29: 50 mg via ORAL

## 2015-03-29 MED ORDER — DIPHENHYDRAMINE HCL 25 MG PO CAPS
ORAL_CAPSULE | ORAL | Status: AC
  Filled 2015-03-29: qty 2

## 2015-03-29 MED ORDER — DOXORUBICIN HCL CHEMO IV INJECTION 2 MG/ML
37.5000 mg/m2 | Freq: Once | INTRAVENOUS | Status: AC
  Administered 2015-03-29: 66 mg via INTRAVENOUS
  Filled 2015-03-29: qty 33

## 2015-03-29 MED ORDER — HEPARIN SOD (PORK) LOCK FLUSH 100 UNIT/ML IV SOLN
500.0000 [IU] | Freq: Once | INTRAVENOUS | Status: AC | PRN
  Administered 2015-03-29: 500 [IU]
  Filled 2015-03-29: qty 5

## 2015-03-29 MED ORDER — SODIUM CHLORIDE 0.9 % IV SOLN
375.0000 mg/m2 | Freq: Once | INTRAVENOUS | Status: AC
  Administered 2015-03-29: 700 mg via INTRAVENOUS
  Filled 2015-03-29: qty 70

## 2015-03-29 MED ORDER — SODIUM CHLORIDE 0.9 % IV SOLN
Freq: Once | INTRAVENOUS | Status: AC
  Administered 2015-03-29: 09:00:00 via INTRAVENOUS
  Filled 2015-03-29: qty 8

## 2015-03-29 NOTE — Progress Notes (Signed)
OK to treat with WBC-2.2 and ANC-0.9 per Dr. Alvy Bimler.  Please review neutropenic precautions and use of mask with pt per MD.

## 2015-03-29 NOTE — Patient Instructions (Signed)

## 2015-03-29 NOTE — Patient Instructions (Addendum)
Food Safety for the Immunocompromised Person Food safety is important for people who are immunocompromised. Immunocompromised means that the immune system is impaired or weakened (as by drugs or illness). This diet is often recommended before and after certain cancer treatments and after an organ or bone marrow transplant. It is important to follow these food safety guidelines for as long as your dietitian or caregiver instructs you to. These food safety guidelines are sometimes called the neutropenic diet or low-microbial diet. Bacteria and other harmful microorganisms are more likely to be present in raw or fresh foods. Thoroughly cooking foods destroys these microorganisms. For example, fresh vegetables should be cooked until tender; meats should be cooked until well-done; and eggs should be cooked until the yolks are firm. Also, certain food products are treated with a method known as pasteurization. Pasteurization briefly exposes food to high heat that kills any bacteria. Look for dairy products, juices, and ciders that have the word "pasteurized" on the label.  GENERAL GUIDELINES  Check expiration dates on all products before you buy them. Nothing you buy should be past the expiration date.  Wash the following items with soap and hot water before and after touching food:  Countertops.  Cutting boards (wash these in a dishwasher if you have one).  All cooking utensils.  All silverware.  All pots and pans.  Before preparing food, wash your hands frequently with warm, soapy water and dry your hands with paper towels. This is especially important after touching raw meat, eggs, and fish.  Wash dishes in hot, soapy water or in a dishwasher. Air dry dishes. Do not use a cloth towel.  Keep perishable food very hot or very cold. Do not leave perishable items at room temperature for more than 10-15 minutes.  All perishable foods should be cooked thoroughly. No rare meat should be eaten.  Wash  fruits and vegetables thoroughly under cold running water before peeling or cutting. Individually scrub produce that has a thick, rough skin or rind, such as lettuce, spinach, or cabbage. Do not use commercial rinses to wash fruits and vegetables.   Packaged salads, slaw mix, and other prepared produce (even marked "prewashed") should be rinsed again under cold running water.   If consuming unpasteurized or fresh tofu, cut tofu into 1 inch (or smaller) cubes. Then, boil the tofu for at least 5 minutes in water or broth before eating or using the tofu in recipes.  Use distilled or bottled water if you are using a water service other than the city water service.  Thaw frozen foods in the refrigerator overnight or quickly in the microwave. Do not thaw food on the countertop.  Refrigerate leftovers promptly in airtight containers.  Use leftovers only if they have been stored properly and have been around for no more than 24 hours.  When food shopping, avoid salad bars, bulk food bins, food samples, and snacks that are out in the open.  When dining out:  Avoid salad bars, delis, and buffets.  Use single-serve condiments, such as ketchup, mustard, mayonnaise, soy sauce, steak sauce, salt, pepper, and sugar. Check with your caregiver after blood work is done to see when your neutropenic diet can be modified with fewer restrictions. SPECIFIC EXAMPLE GUIDELINES Beverages  Allowed: Boiled well water. Bottled spring, distilled, and natural water. Tap water and ice made from bottled or tap water. All canned, bottled, powdered beverages. Instant and brewed coffee, tea; cold-brewed tea made with boiling water. Brewed herbal teas using commercially-packaged tea bags.  Liquid and powdered commercial nutritional supplements. Other beverages not listed below.   Avoid: Unboiled well water.Cold-brewed tea made with warm or cold water. Raw, unpasteurized milk. Unpasteurized fruit and vegetable juices. Mat  tea. Eggnog or milkshakes made with raw eggs. Fresh apple cider.  Meat, Fish, Eggs, Poultry  Allowed: All thoroughly-cooked or canned meats: beef, pork, lamb, poultry, fish, shellfish, game, ham, bacon, sausage, hot dogs. Thoroughly cooked pasteurized egg substitutes and eggs (egg white cooked firm with thickened yellow yolk acceptable). Commercially packaged salami, bologna, and other luncheon meats. Hot dogs should be heated until steaming (165F [73.9C]). Cooked tofu. Prepackaged peanut butter.  Avoid: Uncooked or rare meat, fish, eggs, or poultry. Commercially prepared meat and fish salads. Sushi. Raw or undercooked meat, poultry, fish, game, tofu. Raw or undercooked eggs and egg substitutes. Unheated meats and cold cuts from the deli. Hard cured salami in natural wrap. Cold-smoked salmon, lox. Pickled fish. [place] products. Dairy Products  Allowed: Pasteurized, grade "A" milk or milk products or lactose-free milk or yogurt. Pasteurized yogurt or frozen yogurt. Prepackaged ice cream, sherbet, ice cream bars, homemade milkshakes. Prepackaged and pasteurized hard cheeses, such as cheddar, [place], [place], or Swiss. Prepackaged soft cheeses, such as cottage cheese, cream cheese, or ricotta. Dry, refrigerated, and frozen pasteurized whipped topping. Commercial nutritional supplements. Pasteurized eggnog. Pasteurized sour cream.  Avoid: Soft-serve ice cream or frozen yogurt. Hand-packed ice cream or frozen yogurt. Feta, brie, camembert, blue, gorgonzola, Stilton, Roquefort, farmer's cheese, and queso fresco cheeses. Any imported cheeses and any cheese sliced at a deli.Unpasteurized or raw milk cheese, yogurt, and other milk products. Cheeses containing chili peppers or other uncooked vegetables. Breads, Cereals, Rice, Potatoes, Pasta  Allowed: All prepackaged or homemade breads, bagels, rolls, pancakes, sweet rolls, waffles, [place] toast, muffins, cakes, donuts, cookies, crackers. All boxed hot  or cold cereals. Cooked potatoes, rice, noodles, other grains. Potato chips, corn chips, tortilla chips, pretzels, popcorn.  Avoid: Fresh bakery breads, muffins, cakes, donuts, cream or custard filled cakes. Raw grain products. Vegetables and Fruits  Allowed: All frozen, canned, and washed raw vegetables that have been cooked. All cooked or canned fruits. Raw, well-washed and non-bruised fruits. Pasteurized fruit juices. Canned and stewed fruit. Dried fruits.  Avoid: Unwashedraw vegetables and salads. All raw vegetable sprouts (alfalfa, radish, broccoli, mung bean, all others). Salads from the deli.Prepackaged salsas stored in refrigerated case. Unwashed raw fruits. Unpasteurized fruit and vegetable juices. Nuts  Allowed: Processed peanut butter. Canned or bottled roasted nuts. Nuts in baked products.  Avoid:Unroasted raw nuts. Unprocessed nuts. Roasted nuts in the shell. Condiments and Spices  Allowed: All cooked, fresh, or canned spices (add at least 5 minutes before cooking ends). Thoroughly washed fresh herbs and spices. Ketchup, mustard, BBQ sauce, soy sauce, and mayonnaise served in separate containers with clean utensils, refrigerated after opening. Sugar, jelly, and honey served from clean containers with clean utensils.   Avoid: Uncooked spices. Raw honey. Anything from a family container that is not freshly washed.  Desserts  Allowed:Refrigerated commercial and homemade cakes, pies, pastries, and pudding. Refrigerated cream-filled pastries. Homemade and commercial cookies. Shelf-stable cream-filled cupcakes, fruit pies, and canned pudding. Ices, popsicle-like products.  Avoid: Unrefrigerated, cream-filled pastry products (not shelf-stable). Fats  Allowed: Oil, shortening, refrigerated lard, margarine, butter. Commercial or shelf-stable mayonnaise and salad dressings (including cheese-based salad dressings, refrigerated after opening). Cooked gravy and sauces.  Avoid:  Fresh salad dressings containing aged cheese (blue cheese, Roquefort) or raw eggs, stored in refrigerated case. Restaurant Foods and Miscellaneous  Allowed:Thoroughly cooked frozen dinners. Thoroughly cooked frozen pizza. Canned entrees.   Avoid: Eating at restaurants while in neutropenia or using take-out deli food even if it is behind the counter. Avoid all salad bars while you are neutropenic. Avoid all self-serve buffets while you are neutropenic.  Ask your caregiver for information or recommendations regarding poor appetite and weight loss during cancer treatment if needed.  Document Released: 10/05/2007 Document Revised: 06/08/2012 Document Reviewed: 04/23/2012 [facility] Patient Information 2015 [place], [place]. This information is not intended to replace advice given to you by your health care provider. Make sure you discuss any questions you have with your health care provider. Neutropenia Neutropenia is a condition that occurs when the level of a certain type of white blood cell (neutrophil) in your body becomes lower than normal. Neutrophils are made in the bone marrow and fight infections. These cells protect against bacteria and viruses. The fewer neutrophils you have, and the longer your body remains without them, the greater your risk of getting a severe infection becomes. CAUSES  The cause of neutropenia may be hard to determine. However, it is usually due to 3 main problems:   Decreased production of neutrophils. This may be due to:  Certain medicines such as chemotherapy.  Genetic problems.  Cancer.  Radiation treatments.  Vitamin deficiency.  Some pesticides.  Increased destruction of neutrophils. This may be due to:  Overwhelming infections.  Hemolytic anemia. This is when the body destroys its own blood cells.  Chemotherapy.  Neutrophils moving to areas of the body where they cannot fight infections. This may be due to:  Dialysis procedures.  Conditions  where the spleen becomes enlarged. Neutrophils are held in the spleen and are not available to the rest of the body.  Overwhelming infections. The neutrophils are held in the area of the infection and are not available to the rest of the body. SYMPTOMS  There are no specific symptoms of neutropenia. The lack of neutrophils can result in an infection, and an infection can cause various problems. DIAGNOSIS  Diagnosis is made by a blood test. A complete blood count is performed. The normal level of neutrophils in human blood differs with age and race. Infants have lower counts than older children and adults. African Americans have lower counts than Caucasians or Asians. The average adult level is 1500 cells/mm3 of blood. Neutrophil counts are interpreted as follows:  Greater than 1000 cells/mm3 gives normal protection against infection.  500 to 1000 cells/mm3 gives an increased risk for infection.  200 to 500 cells/mm3 is a greater risk for severe infection.  Lower than 200 cells/mm3 is a marked risk of infection. This may require hospitalization and treatment with antibiotic medicines. TREATMENT  Treatment depends on the underlying cause, severity, and presence of infections or symptoms. It also depends on your health. Your caregiver will discuss the treatment plan with you. Mild cases are often easily treated and have a good outcome. Preventative measures may also be started to limit your risk of infections. Treatment can include:  Taking antibiotics.  Stopping medicines that are known to cause neutropenia.  Correcting nutritional deficiencies by eating green vegetables to supply folic acid and taking vitamin B supplements.  Stopping exposure to pesticides if your neutropenia is related to pesticide exposure.  Taking a blood growth factor called sargramostim, pegfilgrastim, or filgrastim if you are undergoing chemotherapy for cancer. This stimulates white blood cell production.  Removal of  the spleen if you have Felty's syndrome and have repeated infections.  HOME CARE INSTRUCTIONS   Follow your caregiver's instructions about when you need to have blood work done.  Wash your hands often. Make sure others who come in contact with you also wash their hands.  Wash raw fruits and vegetables before eating them. They can carry bacteria and fungi.  Avoid people with colds or spreadable (contagious) diseases (chickenpox, herpes zoster, influenza).  Avoid large crowds.  Avoid construction areas. The dust can release fungus into the air.  Be cautious around children in daycare or school environments.  Take care of your respiratory system by coughing and deep breathing.  Bathe daily.  Protect your skin from cuts and burns.  Do not work in the garden or with flowers and plants.  Care for the mouth before and after meals by brushing with a soft toothbrush. If you have mucositis, do not use mouthwash. Mouthwash contains alcohol and can dry out the mouth even more.  Clean the area between the genitals and the anus (perineal area) after urination and bowel movements. Women need to wipe from front to back.  Use a water soluble lubricant during sexual intercourse and practice good hygiene after. Do not have intercourse if you are severely neutropenic. Check with your caregiver for guidelines.  Exercise daily as tolerated.  Avoid people who were vaccinated with a live vaccine in the past 30 days. You should not receive live vaccines (polio, typhoid).  Do not provide direct care for pets. Avoid animal droppings. Do not clean litter boxes and bird cages.  Do not share food utensils.  Do not use tampons, enemas, or rectal suppositories unless directed by your caregiver.  Use an electric razor to remove hair.  Wash your hands after handling magazines, letters, and newspapers. SEEK IMMEDIATE MEDICAL CARE IF:   You have a fever.  You have chills or start to shake.  You feel  nauseous or vomit.  You develop mouth sores.  You develop aches and pains.  You have redness and swelling around open wounds.  Your skin is warm to the touch.  You have pus coming from your wounds.  You develop swollen lymph nodes.  You feel weak or fatigued.  You develop red streaks on the skin. MAKE SURE YOU:  Understand these instructions.  Will watch your condition.  Will get help right away if you are not doing well or get worse. Document Released: 05/30/2002 Document Revised: 03/01/2012 Document Reviewed: 06/27/2011 [facility] Patient Information 2015 [place], [place]. This information is not intended to replace advice given to you by your health care provider. Make sure you discuss any questions you have with your health care provider. [place] Discharge Instructions for Patients Receiving Chemotherapy  Today you received the following chemotherapy agents Rituxan, Adriamycin, Oncovin,Cytoxan.  To help prevent nausea and vomiting after your treatment, we encourage you to take your nausea medication as prescribed by your physician.   If you develop nausea and vomiting that is not controlled by your nausea medication, call the clinic.   BELOW ARE SYMPTOMS THAT SHOULD BE REPORTED IMMEDIATELY: *FEVER GREATER THAN 100.5 F *CHILLS WITH OR WITHOUT FEVER NAUSEA AND VOMITING THAT IS NOT CONTROLLED WITH YOUR NAUSEA MEDICATION *UNUSUAL SHORTNESS OF BREATH *UNUSUAL BRUISING OR BLEEDING TENDERNESS IN MOUTH AND THROAT WITH OR WITHOUT PRESENCE OF ULCERS *URINARY PROBLEMS *BOWEL PROBLEMS UNUSUAL RASH Items with * indicate a potential emergency and should be followed up as soon as possible.  Feel free to call the clinic you have any questions or concerns. The  clinic phone number is [phone].  Please show the [place] at check-in to the Emergency Department and triage nurse.

## 2015-03-30 ENCOUNTER — Ambulatory Visit (HOSPITAL_BASED_OUTPATIENT_CLINIC_OR_DEPARTMENT_OTHER): Payer: Medicare Other

## 2015-03-30 DIAGNOSIS — Z5189 Encounter for other specified aftercare: Secondary | ICD-10-CM

## 2015-03-30 DIAGNOSIS — C8331 Diffuse large B-cell lymphoma, lymph nodes of head, face, and neck: Secondary | ICD-10-CM | POA: Diagnosis not present

## 2015-03-30 DIAGNOSIS — C8330 Diffuse large B-cell lymphoma, unspecified site: Secondary | ICD-10-CM

## 2015-03-30 MED ORDER — PEGFILGRASTIM INJECTION 6 MG/0.6ML ~~LOC~~
6.0000 mg | PREFILLED_SYRINGE | Freq: Once | SUBCUTANEOUS | Status: AC
  Administered 2015-03-30: 6 mg via SUBCUTANEOUS
  Filled 2015-03-30: qty 0.6

## 2015-04-03 ENCOUNTER — Telehealth: Payer: Self-pay | Admitting: *Deleted

## 2015-04-03 NOTE — Telephone Encounter (Signed)
TC from patient. She is wanting to know if it will be ok for her to go on Friday 04/06/15 as she will be 1 week out from her last chemo therapy. Pt. had Neulasta on 03/30/15. ANC was .9 last check on 03/29/15. Informed pt. She could go out but to avoid large crowds, where her face mask, practice good handwashing etc. Next labs/chemo set for 04/19/15. Pt. Voiced understanding.

## 2015-04-10 ENCOUNTER — Encounter: Payer: Self-pay | Admitting: *Deleted

## 2015-04-10 NOTE — Progress Notes (Signed)
[place] Work  Clinical Social Work was referred by patient for marital counseling and assessment of psychosocial needs.  Clinical Social Worker met with patient and spouse for counseling session.  Patient, spouse, and CSW explored current issues including trust, communication, and intimacy.  CSW validated patient and spouse feelings and addressed how cancer affects family dynamics.  CSW strongly encouraged patient and spouse to meet with marital therapist for long term counseling to address current needs and past history.  CSW made referral to [place] for sexuality and intimacy.      Polo Riley, MSW, LCSW, OSW-C Clinical Social Worker [facility] [phone]

## 2015-04-19 ENCOUNTER — Ambulatory Visit (HOSPITAL_BASED_OUTPATIENT_CLINIC_OR_DEPARTMENT_OTHER): Payer: Medicare Other

## 2015-04-19 ENCOUNTER — Ambulatory Visit (HOSPITAL_BASED_OUTPATIENT_CLINIC_OR_DEPARTMENT_OTHER): Payer: Medicare Other | Admitting: Hematology and Oncology

## 2015-04-19 ENCOUNTER — Other Ambulatory Visit (HOSPITAL_BASED_OUTPATIENT_CLINIC_OR_DEPARTMENT_OTHER): Payer: Medicare Other

## 2015-04-19 ENCOUNTER — Telehealth: Payer: Self-pay | Admitting: Hematology and Oncology

## 2015-04-19 ENCOUNTER — Other Ambulatory Visit: Payer: Self-pay | Admitting: Hematology and Oncology

## 2015-04-19 ENCOUNTER — Encounter: Payer: Self-pay | Admitting: Hematology and Oncology

## 2015-04-19 ENCOUNTER — Ambulatory Visit: Payer: Medicare Other

## 2015-04-19 VITALS — BP 129/64 | HR 58

## 2015-04-19 VITALS — BP 122/74 | HR 66 | Temp 97.7°F | Resp 18 | Ht 65.0 in | Wt 146.9 lb

## 2015-04-19 DIAGNOSIS — D630 Anemia in neoplastic disease: Secondary | ICD-10-CM | POA: Diagnosis not present

## 2015-04-19 DIAGNOSIS — D72818 Other decreased white blood cell count: Secondary | ICD-10-CM

## 2015-04-19 DIAGNOSIS — C8332 Diffuse large B-cell lymphoma, intrathoracic lymph nodes: Secondary | ICD-10-CM

## 2015-04-19 DIAGNOSIS — C8331 Diffuse large B-cell lymphoma, lymph nodes of head, face, and neck: Secondary | ICD-10-CM

## 2015-04-19 DIAGNOSIS — C8330 Diffuse large B-cell lymphoma, unspecified site: Secondary | ICD-10-CM

## 2015-04-19 DIAGNOSIS — Z95828 Presence of other vascular implants and grafts: Secondary | ICD-10-CM

## 2015-04-19 DIAGNOSIS — D701 Agranulocytosis secondary to cancer chemotherapy: Secondary | ICD-10-CM

## 2015-04-19 DIAGNOSIS — T451X5A Adverse effect of antineoplastic and immunosuppressive drugs, initial encounter: Secondary | ICD-10-CM

## 2015-04-19 LAB — COMPREHENSIVE METABOLIC PANEL (CC13)
ALT: 15 U/L (ref 0–55)
AST: 17 U/L (ref 5–34)
Albumin: 3.8 g/dL (ref 3.5–5.0)
Alkaline Phosphatase: 67 U/L (ref 40–150)
Anion Gap: 11 mEq/L (ref 3–11)
BUN: 12.3 mg/dL (ref 7.0–26.0)
CO2: 25 mEq/L (ref 22–29)
Calcium: 9.2 mg/dL (ref 8.4–10.4)
Chloride: 106 mEq/L (ref 98–109)
Creatinine: 0.7 mg/dL (ref 0.6–1.1)
EGFR: >90 mL/min/{1.73_m2} (ref >90)
Glucose: 87 mg/dl (ref 70–140)
Potassium: 3.9 mEq/L (ref 3.5–5.1)
Sodium: 143 mEq/L (ref 136–145)
Total Bilirubin: 0.31 mg/dL (ref 0.20–1.20)
Total Protein: 6.3 g/dL — ABNORMAL LOW (ref 6.4–8.3)

## 2015-04-19 LAB — CBC WITH DIFFERENTIAL/PLATELET
BASO%: 0.3 % (ref 0.0–2.0)
Basophils Absolute: 0 10*3/uL (ref 0.0–0.1)
EOS%: 3.6 % (ref 0.0–7.0)
Eosinophils Absolute: 0.1 10*3/uL (ref 0.0–0.5)
HCT: 33.8 % — ABNORMAL LOW (ref 34.8–46.6)
HGB: 10.9 g/dL — ABNORMAL LOW (ref 11.6–15.9)
LYMPH%: 41.7 % (ref 14.0–49.7)
MCH: 29.2 pg (ref 25.1–34.0)
MCHC: 32.3 g/dL (ref 31.5–36.0)
MCV: 90.6 fL (ref 79.5–101.0)
MONO#: 0.3 10*3/uL (ref 0.1–0.9)
MONO%: 19.9 % — ABNORMAL HIGH (ref 0.0–14.0)
NEUT#: 0.6 10*3/uL — ABNORMAL LOW (ref 1.5–6.5)
NEUT%: 34.5 % — ABNORMAL LOW (ref 38.4–76.8)
Platelets: 235 10*3/uL (ref 145–400)
RBC: 3.73 10*6/uL (ref 3.70–5.45)
RDW: 16.8 % — ABNORMAL HIGH (ref 11.2–14.5)
WBC: 1.7 10*3/uL — ABNORMAL LOW (ref 3.9–10.3)
lymph#: 0.7 10*3/uL — ABNORMAL LOW (ref 0.9–3.3)

## 2015-04-19 MED ORDER — HEPARIN SOD (PORK) LOCK FLUSH 100 UNIT/ML IV SOLN
500.0000 [IU] | Freq: Once | INTRAVENOUS | Status: AC | PRN
  Administered 2015-04-19: 500 [IU]
  Filled 2015-04-19: qty 5

## 2015-04-19 MED ORDER — SODIUM CHLORIDE 0.9 % IJ SOLN
10.0000 mL | INTRAMUSCULAR | Status: DC | PRN
  Administered 2015-04-19: 10 mL
  Filled 2015-04-19: qty 10

## 2015-04-19 MED ORDER — TBO-FILGRASTIM 480 MCG/0.8ML ~~LOC~~ SOSY
480.0000 ug | PREFILLED_SYRINGE | Freq: Once | SUBCUTANEOUS | Status: AC
  Administered 2015-04-19: 480 ug via SUBCUTANEOUS
  Filled 2015-04-19: qty 0.8

## 2015-04-19 MED ORDER — SODIUM CHLORIDE 0.9 % IJ SOLN
10.0000 mL | INTRAMUSCULAR | Status: DC | PRN
  Administered 2015-04-19: 10 mL via INTRAVENOUS
  Filled 2015-04-19: qty 10

## 2015-04-19 MED ORDER — SODIUM CHLORIDE 0.9 % IV SOLN
Freq: Once | INTRAVENOUS | Status: AC
  Administered 2015-04-19: 10:00:00 via INTRAVENOUS

## 2015-04-19 MED ORDER — SODIUM CHLORIDE 0.9 % IJ SOLN
Freq: Once | INTRAMUSCULAR | Status: DC
  Filled 2015-04-19: qty 0.48

## 2015-04-19 NOTE — Progress Notes (Signed)
[place] OFFICE PROGRESS NOTE  Patient Care Team: Jani Gravel, MD as PCP - General (Internal Medicine) [name], MD as Consulting Physician (Hematology and Oncology) [name], MD as Consulting Physician (Otolaryngology)  SUMMARY OF ONCOLOGIC HISTORY:   Diffuse large B cell lymphoma   01/31/2015 Imaging CT scan of the neck showed large mass centered around the right parapharyngeal space, measuring approximately 3.6 cm with bulky lymphadenopathy on the right side. Incidentally 2 nodules were found.   02/05/2015 Pathology Results Accession: [id number] biopsy confirmed diffuse large B-cell lymphoma   02/05/2015 Surgery She had excisional lymph node biopsy of the right neck mass   02/16/2015 Imaging  echocardiogram showed preserved ejection fraction.   02/21/2015 Imaging  she had PET CT scan which showed stage II disease.   02/26/2015 Procedure  she had placement of Infuse-a-Port.   03/01/2015 -  Chemotherapy  she received R-CHOP chemotherapy with IT methotrexate   03/22/2015 Adverse Reaction Cycle 2 of RCHOP chemotherapy is delayed due to neutropenia     INTERVAL HISTORY: Please see below for problem oriented charting. She returns for further follow-up. She denies any signs and symptoms or side effects from treatment such as mucositis, nausea, fevers or chills. The neck mass has regressed in size.  REVIEW OF SYSTEMS:   Constitutional: Denies fevers, chills or abnormal weight loss Eyes: Denies blurriness of vision Ears, nose, mouth, throat, and face: Denies mucositis or sore throat Respiratory: Denies cough, dyspnea or wheezes Cardiovascular: Denies palpitation, chest discomfort or lower extremity swelling Gastrointestinal:  Denies nausea, heartburn or change in bowel habits Skin: Denies abnormal skin rashes Lymphatics: Denies new lymphadenopathy or easy bruising Neurological:Denies numbness, tingling or new weaknesses Behavioral/Psych: Mood is stable, no new changes  All other  systems were reviewed with the patient and are negative.  I have reviewed the past medical history, past surgical history, social history and family history with the patient and they are unchanged from previous note.  ALLERGIES:  is allergic to erythromycin; hydrocodone; peanut-containing drug products; and biaxin.  MEDICATIONS:  Current Outpatient Prescriptions  Medication Sig Dispense Refill  . allopurinol (ZYLOPRIM) 300 MG tablet Take 1 tablet (300 mg total) by mouth daily. 30 tablet 3  . carboxymethylcellulose 1 % ophthalmic solution 1 drop 3 (three) times daily. Refresh    . fluticasone (FLONASE) 50 MCG/ACT nasal spray   0  . lidocaine-prilocaine (EMLA) cream Apply to affected area once 30 g 3  . ondansetron (ZOFRAN) 8 MG tablet Take 1 tablet (8 mg total) by mouth every 8 (eight) hours as needed. 30 tablet 1  . oxyCODONE-acetaminophen (PERCOCET/ROXICET) 5-325 MG per tablet     . predniSONE (DELTASONE) 50 MG tablet Take 1 tablet (50 mg total) by mouth daily. Take on days 2-5 of chemotherapy. 12 tablet 0  . PRESCRIPTION MEDICATION Place 4 drops into the right ear daily as needed (tube in ear for drainage.).     Marland Kitchen prochlorperazine (COMPAZINE) 10 MG tablet Take 1 tablet (10 mg total) by mouth every 6 (six) hours as needed (Nausea or vomiting). 30 tablet 6   No current facility-administered medications for this visit.   Facility-Administered Medications Ordered in Other Visits  Medication Dose Route Frequency Provider Last Rate Last Dose  . methotrexate 12 mg in sodium chloride 0.9 % INTRATHECAL chemo injection   Intrathecal Once [name], MD      . sodium chloride 0.9 % injection 10 mL  10 mL Intracatheter PRN [name], MD   10 mL  at 04/19/15 1206    PHYSICAL EXAMINATION: ECOG PERFORMANCE STATUS: 0 - Asymptomatic  Filed Vitals:   04/19/15 0827  BP: 122/74  Pulse: 66  Temp: 97.7 F (36.5 C)  Resp: 18   Filed Weights   04/19/15 0827  Weight: 146 lb 14.4 oz (66.633 kg)     GENERAL:alert, no distress and comfortable SKIN: skin color, texture, turgor are normal, no rashes or significant lesions EYES: normal, Conjunctiva are pink and non-injected, sclera clear OROPHARYNX:no exudate, no erythema and lips, buccal mucosa, and tongue normal  NECK: supple, thyroid normal size, non-tender, without nodularity LYMPH:  no palpable lymphadenopathy in the cervical, axillary or inguinal LUNGS: clear to auscultation and percussion with normal breathing effort HEART: regular rate & rhythm and no murmurs and no lower extremity edema ABDOMEN:abdomen soft, non-tender and normal bowel sounds Musculoskeletal:no cyanosis of digits and no clubbing  NEURO: alert & oriented x 3 with fluent speech, no focal motor/sensory deficits  LABORATORY DATA:  I have reviewed the data as listed    Component Value Date/Time   NA 143 04/19/2015 0804   NA 137 02/26/2015 1330   K 3.9 04/19/2015 0804   K 3.7 02/26/2015 1330   CL 102 02/26/2015 1330   CO2 25 04/19/2015 0804   CO2 29 02/26/2015 1330   GLUCOSE 87 04/19/2015 0804   GLUCOSE 79 02/26/2015 1330   BUN 12.3 04/19/2015 0804   BUN 9 02/26/2015 1330   CREATININE 0.7 04/19/2015 0804   CREATININE 0.72 02/26/2015 1330   CREATININE 0.65 01/08/2015 0946   CALCIUM 9.2 04/19/2015 0804   CALCIUM 9.0 02/26/2015 1330   PROT 6.3* 04/19/2015 0804   PROT 6.9 02/19/2015 0720   ALBUMIN 3.8 04/19/2015 0804   ALBUMIN 3.9 02/19/2015 0720   AST 17 04/19/2015 0804   AST 24 02/19/2015 0720   ALT 15 04/19/2015 0804   ALT 19 02/19/2015 0720   ALKPHOS 67 04/19/2015 0804   ALKPHOS 55 02/19/2015 0720   BILITOT 0.31 04/19/2015 0804   BILITOT 0.5 02/19/2015 0720   GFRNONAA 87* 02/26/2015 1330   GFRAA >90 02/26/2015 1330    No results found for: SPEP, UPEP  Lab Results  Component Value Date   WBC 1.7* 04/19/2015   NEUTROABS 0.6* 04/19/2015   HGB 10.9* 04/19/2015   HCT 33.8* 04/19/2015   MCV 90.6 04/19/2015   PLT 235 04/19/2015       Chemistry      Component Value Date/Time   NA 143 04/19/2015 0804   NA 137 02/26/2015 1330   K 3.9 04/19/2015 0804   K 3.7 02/26/2015 1330   CL 102 02/26/2015 1330   CO2 25 04/19/2015 0804   CO2 29 02/26/2015 1330   BUN 12.3 04/19/2015 0804   BUN 9 02/26/2015 1330   CREATININE 0.7 04/19/2015 0804   CREATININE 0.72 02/26/2015 1330   CREATININE 0.65 01/08/2015 0946      Component Value Date/Time   CALCIUM 9.2 04/19/2015 0804   CALCIUM 9.0 02/26/2015 1330   ALKPHOS 67 04/19/2015 0804   ALKPHOS 55 02/19/2015 0720   AST 17 04/19/2015 0804   AST 24 02/19/2015 0720   ALT 15 04/19/2015 0804   ALT 19 02/19/2015 0720   BILITOT 0.31 04/19/2015 0804   BILITOT 0.5 02/19/2015 0720       RADIOGRAPHIC STUDIES: I have personally reviewed the radiological images as listed and agreed with the findings in the report. No results found.   ASSESSMENT & PLAN:  Diffuse large B cell  lymphoma The patient presented for cycle 3 of treatment. She continues to have persistent leukopenia but not symptomatic. I recommend G-CSF today and rescheduled RCHOP chemotherapy to tomorrow. I attempted to do intrathecal procedure today instead of tomorrow. Please see procedure note as follows. I plan to repeat PET CT scan in 3 weeks and reassess. If she achieved complete remission, I will refer her to radiation oncologist for radiation therapy.   Anemia in neoplastic disease This is likely due to recent treatment. The patient denies recent history of bleeding such as epistaxis, hematuria or hematochezia. She is asymptomatic from the anemia. I will observe for now.  She does not require transfusion now.   Leukopenia due to antineoplastic chemotherapy This is likely due to recent treatment. The patient denies recent history of fevers, cough, chills, diarrhea or dysuria. She is asymptomatic from the leukopenia. I will observe for now.  I will proceed with IT methotrexate for CNS prophylaxis today. I will give  her 1 dose of G-CSF today and proceed with chemotherapy as scheduled tomorrow without further dose adjustment. She will receive Neulasta injection next week.    Intrathecal administration of chemotherapy Procedure Note   Informed consent was obtained and potential risks including bleeding, infection and pain were reviewed with the patient.  The patient's name, date of birth, identification, consent and allergies were verified prior to the start of procedure and time out was performed.  The skin was prepped with Betadine solution.   5 cc of 1% lidocaine was used to provide local anaesthesia.   The L3/L4 intrathecal space was chosen as the site of procedure. Despite multiple attempts, I am not able to locate the intrathecal space. The procedure was abandoned.  The procedure was tolerated well and there were no complications.  The patient was stable at the end of the procedure.   Orders Placed This Encounter  Procedures  . NM PET Image Restag (PS) Skull Base To Thigh    Standing Status: Future     Number of Occurrences:      Standing Expiration Date: 06/18/2016    Order Specific Question:  Reason for Exam (SYMPTOM  OR DIAGNOSIS REQUIRED)    Answer:  staging lymphoma assess response to Rx    Order Specific Question:  Preferred imaging location?    Answer:  [facility]  . CBC with Differential/Platelet   All questions were answered. The patient knows to call the clinic with any problems, questions or concerns. No barriers to learning was detected. I spent 40 minutes counseling the patient face to face. The total time spent in the appointment was 60 minutes and more than 50% was on counseling and review of test results     [facility], [place], MD 04/19/2015 2:25 PM

## 2015-04-19 NOTE — Patient Instructions (Signed)

## 2015-04-19 NOTE — Assessment & Plan Note (Signed)
The patient presented for cycle 3 of treatment. She continues to have persistent leukopenia but not symptomatic. I recommend G-CSF today and rescheduled RCHOP chemotherapy to tomorrow. I attempted to do intrathecal procedure today instead of tomorrow. Please see procedure note as follows. I plan to repeat PET CT scan in 3 weeks and reassess. If she achieved complete remission, I will refer her to radiation oncologist for radiation therapy.

## 2015-04-19 NOTE — Patient Instructions (Signed)
[place] Discharge Instructions for Patients Receiving Chemotherapy  Today you received the following chemotherapy agents:  Methotrexate  To help prevent nausea and vomiting after your treatment, we encourage you to take your nausea medication as ordered per MD.   If you develop nausea and vomiting that is not controlled by your nausea medication, call the clinic.   BELOW ARE SYMPTOMS THAT SHOULD BE REPORTED IMMEDIATELY:  *FEVER GREATER THAN 100.5 F  *CHILLS WITH OR WITHOUT FEVER  NAUSEA AND VOMITING THAT IS NOT CONTROLLED WITH YOUR NAUSEA MEDICATION  *UNUSUAL SHORTNESS OF BREATH  *UNUSUAL BRUISING OR BLEEDING  TENDERNESS IN MOUTH AND THROAT WITH OR WITHOUT PRESENCE OF ULCERS  *URINARY PROBLEMS  *BOWEL PROBLEMS  UNUSUAL RASH Items with * indicate a potential emergency and should be followed up as soon as possible.  Feel free to call the clinic you have any questions or concerns. The clinic phone number is [phone].  Please show the [facility] at check-in to the Emergency Department and triage nurse.

## 2015-04-19 NOTE — Assessment & Plan Note (Signed)
This is likely due to recent treatment. The patient denies recent history of bleeding such as epistaxis, hematuria or hematochezia. She is asymptomatic from the anemia. I will observe for now.  She does not require transfusion now. 

## 2015-04-19 NOTE — Assessment & Plan Note (Signed)
This is likely due to recent treatment. The patient denies recent history of fevers, cough, chills, diarrhea or dysuria. She is asymptomatic from the leukopenia. I will observe for now.  I will proceed with IT methotrexate for CNS prophylaxis today. I will give her 1 dose of G-CSF today and proceed with chemotherapy as scheduled tomorrow without further dose adjustment. She will receive Neulasta injection next week.

## 2015-04-19 NOTE — Telephone Encounter (Signed)
Gave avs & calendar for April/May. °

## 2015-04-20 ENCOUNTER — Telehealth: Payer: Self-pay

## 2015-04-20 ENCOUNTER — Ambulatory Visit: Payer: Medicare Other | Admitting: Hematology and Oncology

## 2015-04-20 ENCOUNTER — Ambulatory Visit (HOSPITAL_BASED_OUTPATIENT_CLINIC_OR_DEPARTMENT_OTHER): Payer: Medicare Other

## 2015-04-20 ENCOUNTER — Ambulatory Visit: Payer: Medicare Other

## 2015-04-20 VITALS — BP 110/69 | HR 59 | Temp 98.2°F | Resp 18

## 2015-04-20 DIAGNOSIS — C8331 Diffuse large B-cell lymphoma, lymph nodes of head, face, and neck: Secondary | ICD-10-CM

## 2015-04-20 DIAGNOSIS — Z5111 Encounter for antineoplastic chemotherapy: Secondary | ICD-10-CM

## 2015-04-20 DIAGNOSIS — Z5112 Encounter for antineoplastic immunotherapy: Secondary | ICD-10-CM

## 2015-04-20 DIAGNOSIS — C8330 Diffuse large B-cell lymphoma, unspecified site: Secondary | ICD-10-CM

## 2015-04-20 MED ORDER — DIPHENHYDRAMINE HCL 25 MG PO CAPS
ORAL_CAPSULE | ORAL | Status: AC
  Filled 2015-04-20: qty 2

## 2015-04-20 MED ORDER — SODIUM CHLORIDE 0.9 % IV SOLN
Freq: Once | INTRAVENOUS | Status: AC
  Administered 2015-04-20: 11:00:00 via INTRAVENOUS
  Filled 2015-04-20: qty 8

## 2015-04-20 MED ORDER — ACETAMINOPHEN 325 MG PO TABS
650.0000 mg | ORAL_TABLET | Freq: Once | ORAL | Status: AC
  Administered 2015-04-20: 650 mg via ORAL

## 2015-04-20 MED ORDER — ACETAMINOPHEN 325 MG PO TABS
ORAL_TABLET | ORAL | Status: AC
  Filled 2015-04-20: qty 2

## 2015-04-20 MED ORDER — HEPARIN SOD (PORK) LOCK FLUSH 100 UNIT/ML IV SOLN
500.0000 [IU] | Freq: Once | INTRAVENOUS | Status: AC | PRN
  Administered 2015-04-20: 500 [IU]
  Filled 2015-04-20: qty 5

## 2015-04-20 MED ORDER — DOXORUBICIN HCL CHEMO IV INJECTION 2 MG/ML
37.5000 mg/m2 | Freq: Once | INTRAVENOUS | Status: AC
  Administered 2015-04-20: 66 mg via INTRAVENOUS
  Filled 2015-04-20: qty 33

## 2015-04-20 MED ORDER — SODIUM CHLORIDE 0.9 % IV SOLN
562.5000 mg/m2 | Freq: Once | INTRAVENOUS | Status: AC
  Administered 2015-04-20: 980 mg via INTRAVENOUS
  Filled 2015-04-20: qty 49

## 2015-04-20 MED ORDER — SODIUM CHLORIDE 0.9 % IV SOLN
Freq: Once | INTRAVENOUS | Status: AC
  Administered 2015-04-20: 10:00:00 via INTRAVENOUS

## 2015-04-20 MED ORDER — SODIUM CHLORIDE 0.9 % IJ SOLN
10.0000 mL | INTRAMUSCULAR | Status: DC | PRN
  Administered 2015-04-20: 10 mL
  Filled 2015-04-20: qty 10

## 2015-04-20 MED ORDER — VINCRISTINE SULFATE CHEMO INJECTION 1 MG/ML
2.0000 mg | Freq: Once | INTRAVENOUS | Status: AC
  Administered 2015-04-20: 2 mg via INTRAVENOUS
  Filled 2015-04-20: qty 2

## 2015-04-20 MED ORDER — SODIUM CHLORIDE 0.9 % IV SOLN
375.0000 mg/m2 | Freq: Once | INTRAVENOUS | Status: AC
  Administered 2015-04-20: 700 mg via INTRAVENOUS
  Filled 2015-04-20: qty 70

## 2015-04-20 MED ORDER — DIPHENHYDRAMINE HCL 25 MG PO CAPS
50.0000 mg | ORAL_CAPSULE | Freq: Once | ORAL | Status: AC
  Administered 2015-04-20: 50 mg via ORAL

## 2015-04-20 NOTE — Patient Instructions (Signed)
[place] Discharge Instructions for Patients Receiving Chemotherapy  Today you received the following chemotherapy agents adriamycin/vincristine/cytoxan/rituxan   To help prevent nausea and vomiting after your treatment, we encourage you to take your nausea medication as directed   If you develop nausea and vomiting that is not controlled by your nausea medication, call the clinic.   BELOW ARE SYMPTOMS THAT SHOULD BE REPORTED IMMEDIATELY:  *FEVER GREATER THAN 100.5 F  *CHILLS WITH OR WITHOUT FEVER  NAUSEA AND VOMITING THAT IS NOT CONTROLLED WITH YOUR NAUSEA MEDICATION  *UNUSUAL SHORTNESS OF BREATH  *UNUSUAL BRUISING OR BLEEDING  TENDERNESS IN MOUTH AND THROAT WITH OR WITHOUT PRESENCE OF ULCERS  *URINARY PROBLEMS  *BOWEL PROBLEMS  UNUSUAL RASH Items with * indicate a potential emergency and should be followed up as soon as possible.  Feel free to call the clinic you have any questions or concerns. The clinic phone number is [phone].

## 2015-04-20 NOTE — Telephone Encounter (Signed)
Pt requesting refill on her prednisone. She only has 3 tabs left and needs 2 more for this cycle.

## 2015-04-20 NOTE — Telephone Encounter (Signed)
This treatment is her last cycle. She only need 60 mg X 4 days after chemo.

## 2015-04-21 ENCOUNTER — Ambulatory Visit: Payer: Medicare Other

## 2015-04-23 ENCOUNTER — Ambulatory Visit (HOSPITAL_BASED_OUTPATIENT_CLINIC_OR_DEPARTMENT_OTHER): Payer: Medicare Other

## 2015-04-23 ENCOUNTER — Telehealth: Payer: Self-pay

## 2015-04-23 VITALS — BP 132/90 | HR 63 | Temp 98.3°F | Resp 16

## 2015-04-23 DIAGNOSIS — C8330 Diffuse large B-cell lymphoma, unspecified site: Secondary | ICD-10-CM

## 2015-04-23 DIAGNOSIS — D701 Agranulocytosis secondary to cancer chemotherapy: Secondary | ICD-10-CM

## 2015-04-23 DIAGNOSIS — C8331 Diffuse large B-cell lymphoma, lymph nodes of head, face, and neck: Secondary | ICD-10-CM | POA: Diagnosis not present

## 2015-04-23 MED ORDER — PREDNISONE 50 MG PO TABS
30.0000 mg/m2 | ORAL_TABLET | Freq: Every day | ORAL | Status: DC
Start: 2015-04-23 — End: 2015-05-11

## 2015-04-23 MED ORDER — PEGFILGRASTIM INJECTION 6 MG/0.6ML ~~LOC~~
6.0000 mg | PREFILLED_SYRINGE | Freq: Once | SUBCUTANEOUS | Status: AC
  Administered 2015-04-23: 6 mg via SUBCUTANEOUS
  Filled 2015-04-23: qty 0.6

## 2015-04-23 NOTE — Patient Instructions (Signed)
Pegfilgrastim injection What is this medicine? PEGFILGRASTIM (peg fil GRA stim) is a long-acting granulocyte colony-stimulating factor that stimulates the growth of neutrophils, a type of white blood cell important in the body's fight against infection. It is used to reduce the incidence of fever and infection in patients with certain types of cancer who are receiving chemotherapy that affects the bone marrow. This medicine may be used for other purposes; ask your health care provider or pharmacist if you have questions. COMMON BRAND NAME(S): Neulasta What should I tell my health care provider before I take this medicine? They need to know if you have any of these conditions: -latex allergy -ongoing radiation therapy -sickle cell disease -skin reactions to acrylic adhesives (On-Body Injector only) -an unusual or allergic reaction to pegfilgrastim, filgrastim, other medicines, foods, dyes, or preservatives -pregnant or trying to get pregnant -breast-feeding How should I use this medicine? This medicine is for injection under the skin. If you get this medicine at home, you will be taught how to prepare and give the pre-filled syringe or how to use the On-body Injector. Refer to the patient Instructions for Use for detailed instructions. Use exactly as directed. Take your medicine at regular intervals. Do not take your medicine more often than directed. It is important that you put your used needles and syringes in a special sharps container. Do not put them in a trash can. If you do not have a sharps container, call your pharmacist or healthcare provider to get one. Talk to your pediatrician regarding the use of this medicine in children. Special care may be needed. Overdosage: If you think you have taken too much of this medicine contact a poison control center or emergency room at once. NOTE: This medicine is only for you. Do not share this medicine with others. What if I miss a dose? It is  important not to miss your dose. Call your doctor or health care professional if you miss your dose. If you miss a dose due to an On-body Injector failure or leakage, a new dose should be administered as soon as possible using a single prefilled syringe for manual use. What may interact with this medicine? Interactions have not been studied. Give your health care provider a list of all the medicines, herbs, non-prescription drugs, or dietary supplements you use. Also tell them if you smoke, drink alcohol, or use illegal drugs. Some items may interact with your medicine. This list may not describe all possible interactions. Give your health care provider a list of all the medicines, herbs, non-prescription drugs, or dietary supplements you use. Also tell them if you smoke, drink alcohol, or use illegal drugs. Some items may interact with your medicine. What should I watch for while using this medicine? You may need blood work done while you are taking this medicine. If you are going to need a MRI, CT scan, or other procedure, tell your doctor that you are using this medicine (On-Body Injector only). What side effects may I notice from receiving this medicine? Side effects that you should report to your doctor or health care professional as soon as possible: -allergic reactions like skin rash, itching or hives, swelling of the face, lips, or tongue -dizziness -fever -pain, redness, or irritation at site where injected -pinpoint red spots on the skin -shortness of breath or breathing problems -stomach or side pain, or pain at the shoulder -swelling -tiredness -trouble passing urine Side effects that usually do not require medical attention (report to your doctor   or health care professional if they continue or are bothersome): -bone pain -muscle pain This list may not describe all possible side effects. Call your doctor for medical advice about side effects. You may report side effects to FDA at  1-800-FDA-1088. Where should I keep my medicine? Keep out of the reach of children. Store pre-filled syringes in a refrigerator between 2 and 8 degrees C (36 and 46 degrees F). Do not freeze. Keep in carton to protect from light. Throw away this medicine if it is left out of the refrigerator for more than 48 hours. Throw away any unused medicine after the expiration date. NOTE: This sheet is a summary. It may not cover all possible information. If you have questions about this medicine, talk to your doctor, pharmacist, or health care provider.  2015, Elsevier/Gold Standard. (2014-03-09 16:14:05)  

## 2015-04-23 NOTE — Telephone Encounter (Signed)
S/w pt and she is one pill short on her prednisone for this cycle. This is her last cycle. Will e-scribe 1 tablet.

## 2015-04-25 ENCOUNTER — Telehealth: Payer: Self-pay | Admitting: *Deleted

## 2015-04-25 NOTE — Telephone Encounter (Signed)
VM message from patient regarding grapefruit and her medications.  She states that she ate 1/2 grapefruit this morning and is now wondering if it would react negativley with any of her medications.   Attempted call back, no answer and VM full.

## 2015-05-10 ENCOUNTER — Ambulatory Visit (HOSPITAL_BASED_OUTPATIENT_CLINIC_OR_DEPARTMENT_OTHER): Payer: Medicare Other

## 2015-05-10 ENCOUNTER — Ambulatory Visit (HOSPITAL_COMMUNITY)
Admission: RE | Admit: 2015-05-10 | Discharge: 2015-05-10 | Disposition: A | Discharge status: Home / Self Care | Payer: Medicare Other | Source: Ambulatory Visit | Attending: Hematology and Oncology | Admitting: Hematology and Oncology

## 2015-05-10 ENCOUNTER — Other Ambulatory Visit (HOSPITAL_BASED_OUTPATIENT_CLINIC_OR_DEPARTMENT_OTHER): Payer: Medicare Other

## 2015-05-10 VITALS — BP 103/65 | HR 55 | Temp 98.0°F

## 2015-05-10 DIAGNOSIS — C8332 Diffuse large B-cell lymphoma, intrathoracic lymph nodes: Secondary | ICD-10-CM

## 2015-05-10 DIAGNOSIS — C8590 Non-Hodgkin lymphoma, unspecified, unspecified site: Secondary | ICD-10-CM | POA: Diagnosis not present

## 2015-05-10 DIAGNOSIS — Z95828 Presence of other vascular implants and grafts: Secondary | ICD-10-CM

## 2015-05-10 DIAGNOSIS — C8330 Diffuse large B-cell lymphoma, unspecified site: Secondary | ICD-10-CM

## 2015-05-10 DIAGNOSIS — Z452 Encounter for adjustment and management of vascular access device: Secondary | ICD-10-CM | POA: Diagnosis not present

## 2015-05-10 LAB — COMPREHENSIVE METABOLIC PANEL (CC13)
ALT: 19 U/L (ref 0–55)
AST: 18 U/L (ref 5–34)
Albumin: 3.7 g/dL (ref 3.5–5.0)
Alkaline Phosphatase: 69 U/L (ref 40–150)
Anion Gap: 12 mEq/L — ABNORMAL HIGH (ref 3–11)
BUN: 17 mg/dL (ref 7.0–26.0)
CO2: 25 mEq/L (ref 22–29)
Calcium: 9.1 mg/dL (ref 8.4–10.4)
Chloride: 107 mEq/L (ref 98–109)
Creatinine: 0.7 mg/dL (ref 0.6–1.1)
EGFR: >90 mL/min/{1.73_m2} (ref >90)
Glucose: 89 mg/dl (ref 70–140)
Potassium: 3.9 mEq/L (ref 3.5–5.1)
Sodium: 143 mEq/L (ref 136–145)
Total Bilirubin: 0.4 mg/dL (ref 0.20–1.20)
Total Protein: 6.4 g/dL (ref 6.4–8.3)

## 2015-05-10 LAB — CBC WITH DIFFERENTIAL/PLATELET
BASO%: 4.6 % — ABNORMAL HIGH (ref 0.0–2.0)
Basophils Absolute: 0.1 10*3/uL (ref 0.0–0.1)
EOS%: 7.7 % — ABNORMAL HIGH (ref 0.0–7.0)
Eosinophils Absolute: 0.2 10*3/uL (ref 0.0–0.5)
HCT: 33.5 % — ABNORMAL LOW (ref 34.8–46.6)
HGB: 11.3 g/dL — ABNORMAL LOW (ref 11.6–15.9)
LYMPH%: 34.5 % (ref 14.0–49.7)
MCH: 30.5 pg (ref 25.1–34.0)
MCHC: 33.7 g/dL (ref 31.5–36.0)
MCV: 90.3 fL (ref 79.5–101.0)
MONO#: 0.3 10*3/uL (ref 0.1–0.9)
MONO%: 16.5 % — ABNORMAL HIGH (ref 0.0–14.0)
NEUT#: 0.7 10*3/uL — ABNORMAL LOW (ref 1.5–6.5)
NEUT%: 36.7 % — ABNORMAL LOW (ref 38.4–76.8)
Platelets: 215 10*3/uL (ref 145–400)
RBC: 3.71 10*6/uL (ref 3.70–5.45)
RDW: 16.4 % — ABNORMAL HIGH (ref 11.2–14.5)
WBC: 1.9 10*3/uL — ABNORMAL LOW (ref 3.9–10.3)
lymph#: 0.7 10*3/uL — ABNORMAL LOW (ref 0.9–3.3)
nRBC: 0 % (ref 0–0)

## 2015-05-10 LAB — GLUCOSE, CAPILLARY: Glucose-Capillary: 63 mg/dL — ABNORMAL LOW (ref 65–99)

## 2015-05-10 MED ORDER — HEPARIN SOD (PORK) LOCK FLUSH 100 UNIT/ML IV SOLN
500.0000 [IU] | Freq: Once | INTRAVENOUS | Status: AC
  Administered 2015-05-10: 500 [IU] via INTRAVENOUS
  Filled 2015-05-10: qty 5

## 2015-05-10 MED ORDER — SODIUM CHLORIDE 0.9 % IJ SOLN
10.0000 mL | INTRAMUSCULAR | Status: DC | PRN
  Administered 2015-05-10: 10 mL via INTRAVENOUS
  Filled 2015-05-10: qty 10

## 2015-05-10 MED ORDER — FLUDEOXYGLUCOSE F - 18 (FDG) INJECTION
6.8000 | Freq: Once | INTRAVENOUS | Status: AC | PRN
  Administered 2015-05-10: 6.8 via INTRAVENOUS

## 2015-05-10 NOTE — Patient Instructions (Signed)

## 2015-05-10 NOTE — Progress Notes (Signed)
Patient in for Port-A-Cath access and labs today. PAC accessed, flushed, and labs obtained. Patient has an appointment with [facility] for a PET scan today, so PAC was left accessed and covered with a Tegaderm dressing. Instructed Patient to make sure that the [facility] needle is removed after her PET scan. Patient verbalized understanding.

## 2015-05-11 ENCOUNTER — Telehealth: Payer: Self-pay | Admitting: Hematology and Oncology

## 2015-05-11 ENCOUNTER — Encounter: Payer: Self-pay | Admitting: Hematology and Oncology

## 2015-05-11 ENCOUNTER — Ambulatory Visit (HOSPITAL_BASED_OUTPATIENT_CLINIC_OR_DEPARTMENT_OTHER): Payer: Medicare Other | Admitting: Hematology and Oncology

## 2015-05-11 VITALS — BP 114/72 | HR 85 | Temp 97.6°F | Resp 18 | Ht 65.0 in | Wt 148.7 lb

## 2015-05-11 DIAGNOSIS — C8331 Diffuse large B-cell lymphoma, lymph nodes of head, face, and neck: Secondary | ICD-10-CM

## 2015-05-11 DIAGNOSIS — D701 Agranulocytosis secondary to cancer chemotherapy: Secondary | ICD-10-CM

## 2015-05-11 DIAGNOSIS — C8330 Diffuse large B-cell lymphoma, unspecified site: Secondary | ICD-10-CM

## 2015-05-11 DIAGNOSIS — T451X5A Adverse effect of antineoplastic and immunosuppressive drugs, initial encounter: Secondary | ICD-10-CM

## 2015-05-11 NOTE — Assessment & Plan Note (Signed)
She is delighted to see significant response to treatment. I recommend consultation with radiation oncologist for radiation therapy instead of giving her more systemic chemotherapy. She agreed with the plan of care. I plan to see her again in 6 weeks report flush and repeat blood work, history and physical examination. After she completes her course of radiation treatment, I recommend repeating another imaging study at the end of the year before removing her port.

## 2015-05-11 NOTE — Progress Notes (Signed)
[place] OFFICE PROGRESS NOTE  Patient Care Team: Jani Gravel, MD as PCP - General (Internal Medicine) [name], MD as Consulting Physician (Hematology and Oncology) [name], MD as Consulting Physician (Otolaryngology)  SUMMARY OF ONCOLOGIC HISTORY:   Diffuse large B cell lymphoma   01/31/2015 Imaging CT scan of the neck showed large mass centered around the right parapharyngeal space, measuring approximately 3.6 cm with bulky lymphadenopathy on the right side. Incidentally 2 nodules were found.   02/05/2015 Pathology Results Accession: [id number] biopsy confirmed diffuse large B-cell lymphoma   02/05/2015 Surgery She had excisional lymph node biopsy of the right neck mass   02/16/2015 Imaging  echocardiogram showed preserved ejection fraction.   02/21/2015 Imaging  she had PET CT scan which showed stage II disease.   02/26/2015 Procedure  she had placement of Infuse-a-Port.   03/01/2015 - 04/20/2015 Chemotherapy  she received 3 cycles of R-CHOP chemotherapy with 2 cycles of IT methotrexate   03/22/2015 Adverse Reaction Cycle 2 of RCHOP chemotherapy is delayed due to neutropenia    05/10/2015 Imaging PET scan showed positive response to treatment    INTERVAL HISTORY: Please see below for problem oriented charting.  she returns for further follow-up. She feels well. Denies recent infection.  REVIEW OF SYSTEMS:   Constitutional: Denies fevers, chills or abnormal weight loss Eyes: Denies blurriness of vision Ears, nose, mouth, throat, and face: Denies mucositis or sore throat Respiratory: Denies cough, dyspnea or wheezes Cardiovascular: Denies palpitation, chest discomfort or lower extremity swelling Gastrointestinal:  Denies nausea, heartburn or change in bowel habits Skin: Denies abnormal skin rashes Lymphatics: Denies new lymphadenopathy or easy bruising Neurological:Denies numbness, tingling or new weaknesses Behavioral/Psych: Mood is stable, no new changes  All other  systems were reviewed with the patient and are negative.  I have reviewed the past medical history, past surgical history, social history and family history with the patient and they are unchanged from previous note.  ALLERGIES:  is allergic to erythromycin; hydrocodone; peanut-containing drug products; and biaxin.  MEDICATIONS:  Current Outpatient Prescriptions  Medication Sig Dispense Refill  . lidocaine-prilocaine (EMLA) cream Apply to affected area once 30 g 3   No current facility-administered medications for this visit.    PHYSICAL EXAMINATION: ECOG PERFORMANCE STATUS: 0 - Asymptomatic  Filed Vitals:   05/11/15 0910  BP: 114/72  Pulse: 85  Temp: 97.6 F (36.4 C)  Resp: 18   Filed Weights   05/11/15 0910  Weight: 148 lb 11.2 oz (67.45 kg)    GENERAL:alert, no distress and comfortable SKIN: skin color, texture, turgor are normal, no rashes or significant lesions EYES: normal, Conjunctiva are pink and non-injected, sclera clear OROPHARYNX:no exudate, no erythema and lips, buccal mucosa, and tongue normal   Musculoskeletal:no cyanosis of digits and no clubbing  NEURO: alert & oriented x 3 with fluent speech, no focal motor/sensory deficits  LABORATORY DATA:  I have reviewed the data as listed    Component Value Date/Time   NA 143 05/10/2015 0813   NA 137 02/26/2015 1330   K 3.9 05/10/2015 0813   K 3.7 02/26/2015 1330   CL 102 02/26/2015 1330   CO2 25 05/10/2015 0813   CO2 29 02/26/2015 1330   GLUCOSE 89 05/10/2015 0813   GLUCOSE 79 02/26/2015 1330   BUN 17.0 05/10/2015 0813   BUN 9 02/26/2015 1330   CREATININE 0.7 05/10/2015 0813   CREATININE 0.72 02/26/2015 1330   CREATININE 0.65 01/08/2015 0946   CALCIUM 9.1 05/10/2015 0813  CALCIUM 9.0 02/26/2015 1330   PROT 6.4 05/10/2015 0813   PROT 6.9 02/19/2015 0720   ALBUMIN 3.7 05/10/2015 0813   ALBUMIN 3.9 02/19/2015 0720   AST 18 05/10/2015 0813   AST 24 02/19/2015 0720   ALT 19 05/10/2015 0813   ALT 19  02/19/2015 0720   ALKPHOS 69 05/10/2015 0813   ALKPHOS 55 02/19/2015 0720   BILITOT 0.40 05/10/2015 0813   BILITOT 0.5 02/19/2015 0720   GFRNONAA 87* 02/26/2015 1330   GFRAA >90 02/26/2015 1330    No results found for: SPEP, UPEP  Lab Results  Component Value Date   WBC 1.9* 05/10/2015   NEUTROABS 0.7* 05/10/2015   HGB 11.3* 05/10/2015   HCT 33.5* 05/10/2015   MCV 90.3 05/10/2015   PLT 215 05/10/2015      Chemistry      Component Value Date/Time   NA 143 05/10/2015 0813   NA 137 02/26/2015 1330   K 3.9 05/10/2015 0813   K 3.7 02/26/2015 1330   CL 102 02/26/2015 1330   CO2 25 05/10/2015 0813   CO2 29 02/26/2015 1330   BUN 17.0 05/10/2015 0813   BUN 9 02/26/2015 1330   CREATININE 0.7 05/10/2015 0813   CREATININE 0.72 02/26/2015 1330   CREATININE 0.65 01/08/2015 0946      Component Value Date/Time   CALCIUM 9.1 05/10/2015 0813   CALCIUM 9.0 02/26/2015 1330   ALKPHOS 69 05/10/2015 0813   ALKPHOS 55 02/19/2015 0720   AST 18 05/10/2015 0813   AST 24 02/19/2015 0720   ALT 19 05/10/2015 0813   ALT 19 02/19/2015 0720   BILITOT 0.40 05/10/2015 0813   BILITOT 0.5 02/19/2015 0720       RADIOGRAPHIC STUDIES: I have personally reviewed the radiological images as listed and agreed with the findings in the report. Nm Pet Image Restag (ps) Skull Base To Thigh  05/10/2015   CLINICAL DATA:  Subsequent treatment strategy for lymphoma.  EXAM: NUCLEAR MEDICINE PET SKULL BASE TO THIGH  TECHNIQUE: 6.8 MCi F-18 FDG was injected intravenously. Full-ring PET imaging was performed from the skull base to thigh after the radiotracer. CT data was obtained and used for attenuation correction and anatomic localization.  FASTING BLOOD GLUCOSE:  Value: 63 mg/dl  COMPARISON:  02/21/2015  FINDINGS: NECK  Significant interval reduction in bulky tumor within the right parapharyngeal space. Residual hypermetabolic level 2 lymph node has an SUV max equal to 3.5. On the previous exam the SUV max was  equal to 8.9.  CHEST  No hypermetabolic mediastinal or hilar nodes. Nonspecific FDG uptake within the right hilar region is again noted. This has an SUV max equal to 3.4. Previous this measured the same. No hypermetabolic supraclavicular or axillary adenopathy.  No pleural effusions noted. Stable ground-glass 7 mm nodule in the superior segment of right lower low without significant FDG uptake. There are 2 ground-glass attenuating nodules right upper lobe which measure up to 7 mm, image 28/series a. These are also unchanged from previous exam. No suspicious pulmonary nodules on the CT scan.  ABDOMEN/PELVIS  No abnormal hypermetabolic activity within the liver, pancreas, adrenal glands, or spleen. No hypermetabolic lymph nodes in the abdomen or pelvis.  SKELETON  No focal hypermetabolic activity to suggest skeletal metastasis.  IMPRESSION: 1. Interval response to therapy. There has been significant reduction and FDG uptake and bulky tumor within the right parapharyngeal space. Residual level-II lymph node exhibits low level nonspecific FDG uptake with an SUV max equal to 3.5.  2. Persistence of ground-glass attenuating nodules in the right lung. These will need annual surveillance for minimum of 3 years to ensure stability. Initial follow-up by chest CT without contrast is recommended in 3 months to confirm persistence. This recommendation follows the consensus statement: Recommendations for the Management of Subsolid Pulmonary Nodules Detected at CT: A Statement from the [place] as published in Radiology 2013; 266:304-317.   Electronically Signed   By: Kerby Moors M.D.   On: 05/10/2015 10:58     ASSESSMENT & PLAN:  Diffuse large B cell lymphoma She is delighted to see significant response to treatment. I recommend consultation with radiation oncologist for radiation therapy instead of giving her more systemic chemotherapy. She agreed with the plan of care. I plan to see her again in 6 weeks  report flush and repeat blood work, history and physical examination. After she completes her course of radiation treatment, I recommend repeating another imaging study at the end of the year before removing her port.   Leukopenia due to antineoplastic chemotherapy This is likely due to recent treatment. The patient denies recent history of fevers, cough, chills, diarrhea or dysuria. She is asymptomatic from the leukopenia. I will observe for now.        Orders Placed This Encounter  Procedures  . Ambulatory referral to Radiation Oncology    Referral Priority:  Routine    Referral Type:  Consultation    Referral Reason:  Specialty Services Required    Requested Specialty:  Radiation Oncology    Number of Visits Requested:  1   All questions were answered. The patient knows to call the clinic with any problems, questions or concerns. No barriers to learning was detected. I spent 15 minutes counseling the patient face to face. The total time spent in the appointment was 20 minutes and more than 50% was on counseling and review of test results     [facility], Brandy Kabat, MD 05/11/2015 1:03 PM

## 2015-05-11 NOTE — Telephone Encounter (Signed)
Gave patient avs report, appointments for July and appointment with Dr. Pablo Ledger 6/2.

## 2015-05-11 NOTE — Assessment & Plan Note (Signed)
This is likely due to recent treatment. The patient denies recent history of fevers, cough, chills, diarrhea or dysuria. She is asymptomatic from the leukopenia. I will observe for now.    

## 2015-05-23 NOTE — Progress Notes (Signed)
Head and Neck Cancer Location of Tumor / Histology:   Patient presented months ago with symptoms of: Patient presented with pneumonia in December 2015 after which she developed enlarged lymph nodes. Ct scan performed revealed large mass around right parapharyngeal space. On 01/31/15.  Biopsies of (if applicable) BTYOMAYO:4/59/9774 Diffuse  Large B cell lymphoma of soft tissue mass, simple excision right neck.   Nutrition Status Yes No Comments  Weight changes? []  [x]    Swallowing concerns? []  [x]    PEG? []  [x]     Referrals Yes No Comments  Social Work? []  []    Dentistry? []  []    Swallowing therapy? []  []    Nutrition? []  []    Med/Onc? [x]  []  Dr.Gorsuch chem x 3 rounds R-CHOP   Safety Issues Yes No Comments  Prior radiation? []  [x]    Pacemaker/ICD? []  [x]    Possible current pregnancy? []  [x]    Is the patient on methotrexate? []  [x]     Tobacco/Marijuana/Snuff/ETOH FSE:LTRV smoking 02/02/74   Past/Anticipated interventions by otolaryngology, if any: Dr.Teoh right neck mass biopsy and right myringotomy and tube placement on 02/05/15.  Past/Anticipated interventions by medical oncology, if any: 3 cycles of R-CHOP  Current Complaints / other details: Allergies:hydrocodone, erythromycin, biaxin and peanuts  No chief complaint on file.

## 2015-05-24 ENCOUNTER — Ambulatory Visit
Admission: RE | Admit: 2015-05-24 | Discharge: 2015-05-24 | Disposition: A | Discharge status: Home / Self Care | Payer: Medicare Other | Source: Ambulatory Visit | Attending: Radiation Oncology | Admitting: Radiation Oncology

## 2015-05-24 ENCOUNTER — Encounter: Payer: Self-pay | Admitting: Radiation Oncology

## 2015-05-24 ENCOUNTER — Encounter: Payer: Self-pay | Admitting: *Deleted

## 2015-05-24 VITALS — BP 114/74 | HR 68 | Temp 97.7°F | Resp 20 | Wt 147.2 lb

## 2015-05-24 DIAGNOSIS — C8330 Diffuse large B-cell lymphoma, unspecified site: Secondary | ICD-10-CM

## 2015-05-24 NOTE — Addendum Note (Signed)
Encounter addended by: Norm Salt, RN on: 05/24/2015 11:07 AM<BR>     Documentation filed: Charges VN

## 2015-05-24 NOTE — Progress Notes (Signed)
Please see the Nurse Progress Note in the MD Initial Consult Encounter for this patient. 

## 2015-05-24 NOTE — Progress Notes (Signed)
  Radiation Oncology         [phone]) [phone] ________________________________  Initial outpatient Consultation - Date: 05/24/2015   Name: Tabitha Jackson MRN: [id number]   DOB: [birth year]  REFERRING PHYSICIAN: [name], MD  DIAGNOSIS AND STAGE: Diffuse large B cell lymphoma   Staging form: Lymphoid Neoplasms, AJCC 6th Edition     Clinical stage from 02/23/2015: Stage II - Signed by [name], MD on 02/23/2015  - Stage 2 Diffuse large B cell lymphoma  HISTORY OF PRESENT ILLNESS::Tabitha Jackson is a 69 y.o. female  who presented with a right neck mass with associated right ear pain and hearing loss. A CT scan of the neck in Feburary showed a right parapharyngeal mass measuring 3.6 cm with associated right cervical adenopathy extending to the level 4 lymph nodes. No contralateral disease or disease was seen. She had a PET scan on March 2nd which confirmed these findings. She has received two cycles of RCHOP chemotherapy and tolerated it well. She had a repeat PET scan on 5/19 which showed interval response to therapy of the residual level two lymph node with an SUV of 3.5. She is referred in the consideration of radiation for consolidative treatment for the management of her disease. She has also been receiving intrathecal Methotrexate. She tolerated chemotherapy well and continues to work out in gym daily. She is ready to start radiation and her last chemo was 5/19. She is accompanied by husband.   PREVIOUS RADIATION THERAPY: No  Past medical, social and family history were reviewed in the electronic chart. Review of symptoms was reviewed in the electronic chart. Medications were reviewed in the electronic chart.   PHYSICAL EXAM:  Filed Vitals:   05/24/15 0836  BP: 114/74  Pulse: 68  Temp: 97.7 F (36.5 C)  Resp: 20  .147 lb 3.2 oz (66.769 kg). No palpable adenopathy, alert oriented x 3.   IMPRESSION: Stage IIE Non-Hodgkins lymphoma  PLAN: I have offered her consolidative radiation therapy for  18 to 20 treatments to 376 to 40 GY per the ILROG guidelines.  We discussed the process of simulation, dietary, speech, and physical therapy. We discussed side effects not limited to hair loss, painful swallowing, difficulty swallowing, and parotid damage leading to possible xerostomia. We discussed skin darkness and possibly hypothyroidism. She signed informed consent and will be scheduled for simulation next week.   She will be referred to speech pathology, dietician and have a baseline TSH. She met with our head and neck navigator.   I spent 60 minutes  face to face with the patient and more than 50% of that time was spent in counseling and/or coordination of care.   ------------------------------------------------  Thea Silversmith, MD   This document serves as a record of services personally performed by Thea Silversmith , MD. It was created on her behalf by Jeralene Peters, a trained medical scribe. The creation of this record is based on the scribe's personal observations and the provider's statements to them. This document has been checked and approved by the attending provider.

## 2015-05-24 NOTE — Progress Notes (Addendum)
Oncology Nurse Navigator Documentation  Oncology Nurse Navigator Flowsheets 05/24/2015  Navigator Encounter Type Initial RadOnc  Patient Visit Type Radonc  Barriers/Navigation Needs No barriers at this time  Time Spent with Patient 60    Met with patient during initial consult with Dr. Pablo Ledger.  She was accompanied by her husband.   1. Introduced myself as their Navigator, explained my role as a member of the Care Team, provided contact information for members of the Care Team, encouraged them to contact me with questions/concerns as treatments/procedures begin. 2. Provided New Patient Information packet:  Contact information for physician and navigator  Fall Prevention Patient Safety Plan  Appointment Guideline  [facility] campus map with highlight of [place] 3. She stated she has ADs, will bring in copies. 4. Provided introductory explanation of radiation treatment including SIM planning and fitting of head mask, showed them example of mask.   5. Provided a tour of SIM and Tomo areas, explained treatment and arrival procedures.   6. Facilitated scheduling of CT SIM for next week Wed. They verbalized understanding of information provided.    Gayleen [place], RN, BSN, [place] at [place] [phone]

## 2015-05-25 ENCOUNTER — Telehealth: Payer: Self-pay | Admitting: *Deleted

## 2015-05-25 NOTE — Telephone Encounter (Signed)
CALLED PATIENT TO INFORM OF LAB ON 05-31-15 @ 11 AM, LVM FOR A RETURN CALL

## 2015-05-25 NOTE — Telephone Encounter (Signed)
CALLED PATIENT TO INFORM OF NUTRITION APPT. ON 06-01-15 @ 10:30 AM WITH BARBARA NEFF, AND SPEECH THERAPY APPT. WITH CARL SCHINIKE ON 07-02-15 @ 2 PM [place], SPOKE WITH PATIENT AND SHE IS AWARE OF THESE APPTS.

## 2015-05-29 ENCOUNTER — Telehealth: Payer: Self-pay | Admitting: *Deleted

## 2015-05-29 NOTE — Telephone Encounter (Signed)
Oncology Nurse Navigator Documentation  Oncology Nurse Navigator Flowsheets 05/29/2015  Navigator Encounter Type Other  Patient Visit Type Follow-up  Called patient in follow-up to her appt with Dr. Pablo Ledger last Thursday.  She denied any questions. I asked if she had any questions prior to her 1000 CT SIM this Thursday, addressed her question re: a projected start date for radiation after this procedure.  She verbalized understanding that she comes to RadOnc Waiting to check in, expressed appreciation for my call.     Barriers/Navigation Needs -  Time Spent with Patient [place], RN, BSN, [place] at [place] [phone]

## 2015-05-31 ENCOUNTER — Encounter: Payer: Self-pay | Admitting: *Deleted

## 2015-05-31 ENCOUNTER — Ambulatory Visit
Admission: RE | Admit: 2015-05-31 | Discharge: 2015-05-31 | Disposition: A | Discharge status: Home / Self Care | Payer: Medicare Other | Source: Ambulatory Visit | Attending: Radiation Oncology | Admitting: Radiation Oncology

## 2015-05-31 DIAGNOSIS — C8331 Diffuse large B-cell lymphoma, lymph nodes of head, face, and neck: Secondary | ICD-10-CM | POA: Insufficient documentation

## 2015-05-31 DIAGNOSIS — C8330 Diffuse large B-cell lymphoma, unspecified site: Secondary | ICD-10-CM | POA: Diagnosis not present

## 2015-05-31 DIAGNOSIS — Z510 Encounter for antineoplastic radiation therapy: Secondary | ICD-10-CM | POA: Diagnosis not present

## 2015-05-31 LAB — TSH CHCC: TSH: 1.406 m(IU)/L (ref 0.308–3.960)

## 2015-05-31 NOTE — Progress Notes (Signed)
[facility] Simulation/Treatment Planning/IMRT note   Tabitha Jackson  [id number] 05/31/2015 [birth year]  Diffuse large B cell lymphoma   Staging form: Lymphoid Neoplasms, AJCC 6th Edition     Clinical stage from 02/23/2015: Stage II - Signed by [name], MD on 02/23/2015  CONSENT VERIFIED: Yes   SET UP: Patient is set-up supine   IMMOBILIZATION: The following immobilization is used:Aquaplast Mask   NARRATIVE:The patient was brought to the [facility].  Identity was confirmed.  All relevant records and images related to the planned course of therapy were reviewed.  Then, the patient was positioned in a stable reproducible clinical set-up for radiation therapy using an aquaplast mask.  Skin markings were placed.  The CT images were loaded into the planning software where the target and avoidance structures were contoured.  The patient's previous PET scan was fused with the planning CT to aid in target delineation. The radiation prescription was entered and confirmed.   TREATMENT PLANNING NOTE:  Treatment planning then occurred. I have requested : Intensity Modulated Radiotherapy (IMRT) is medically necessary for this case for parotid sparing which results in a gain of quality of life by preservation of salivary gland function. In addition the tumor volume is in close proximity to the spinal cord.   I personally supervised and approved the construction of a unique IMRT devices   This document serves as a record of services personally performed by Thea Silversmith , MD. It was created on her behalf by Lenn Cal, a trained medical scribe. The creation of this record is based on the scribe's personal observations and the provider's statements to them. This document has been checked and approved by the attending provider.

## 2015-05-31 NOTE — Progress Notes (Signed)
Oncology Nurse Navigator Documentation  Oncology Nurse Navigator Flowsheets 05/31/2015  Navigator Encounter Type Other  Patient Visit Type Radonc  Treatment Phase CT SIM  To provide support and encouragement, care continuity, met with patient during CT SIM.  She was accompanied by her husband. She reported momentary breathing difficulty that resolved with therapist assistance.  Otherwise she tolerated procedure. After SIM, I again provided tour of Tomo, explained arrival and preparation procedures. She understands she will begin tmt on 06/11/15, can contact me with questions/concerns prior to this time.  Barriers/Navigation Needs -  Time Spent with Patient [name], RN, BSN, Tabitha Jackson at [facility]a [facility] [phone]

## 2015-06-01 ENCOUNTER — Ambulatory Visit: Payer: Medicare Other | Admitting: Nutrition

## 2015-06-01 NOTE — Progress Notes (Signed)
Nutrition follow-up completed with patient who is been diagnosed with B-cell lymphoma. Patient has completed chemotherapy and is beginning radiation therapy on June 20. Weight is stable and documented as 147.2 pounds on June 2. Patient is seeking information about nutrition impact symptoms during radiation treatment.  Nutrition diagnosis: Food and nutrition related knowledge deficit related to B-cell lymphoma and pending radiation therapy as evidenced by no prior need for nutrition related information.  Intervention:  Patient educated to continue small frequent meals and snacks, focusing on high-protein foods. Reviewed potential side effects and nutrition therapy. Recommended weight maintenance. Teach back method was used. Contact information was provided.  Monitoring, evaluation, goals: Patient will continue healthy plant-based diet for weight maintenance throughout treatment.  Next visit: Thursday, June 30 after radiation therapy.  **Disclaimer: This note was dictated with voice recognition software. Similar sounding words can inadvertently be transcribed and this note may contain transcription errors which may not have been corrected upon publication of note.**

## 2015-06-08 DIAGNOSIS — C8330 Diffuse large B-cell lymphoma, unspecified site: Secondary | ICD-10-CM | POA: Diagnosis not present

## 2015-06-11 ENCOUNTER — Ambulatory Visit
Admission: RE | Admit: 2015-06-11 | Discharge: 2015-06-11 | Disposition: A | Discharge status: Home / Self Care | Payer: Medicare Other | Source: Ambulatory Visit | Attending: Radiation Oncology | Admitting: Radiation Oncology

## 2015-06-11 ENCOUNTER — Encounter: Payer: Self-pay | Admitting: *Deleted

## 2015-06-11 DIAGNOSIS — C8330 Diffuse large B-cell lymphoma, unspecified site: Secondary | ICD-10-CM | POA: Diagnosis not present

## 2015-06-11 NOTE — Progress Notes (Signed)
Oncology Nurse Navigator Documentation  Oncology Nurse Navigator Flowsheets 06/11/2015  Navigator Encounter Type Treatment  Patient Visit Type Radonc  Treatment Phase First Radiation Tx  To provide support and encouragement, care continuity and to assess for needs, met with patient during [place]. I discussed the scan/tmt process. I addressed her concern about being able to breathe, we discussed relaxations techniques. She tolerated tmt without incident.   Barriers/Navigation Needs -  Time Spent with Patient [place], RN, BSN, [place] at [place] [phone]

## 2015-06-12 ENCOUNTER — Ambulatory Visit
Admission: RE | Admit: 2015-06-12 | Discharge: 2015-06-12 | Disposition: A | Discharge status: Home / Self Care | Payer: Medicare Other | Source: Ambulatory Visit | Attending: Radiation Oncology | Admitting: Radiation Oncology

## 2015-06-12 ENCOUNTER — Encounter: Payer: Self-pay | Admitting: *Deleted

## 2015-06-12 VITALS — BP 116/71 | HR 68 | Temp 97.6°F | Wt 146.9 lb

## 2015-06-12 DIAGNOSIS — C8330 Diffuse large B-cell lymphoma, unspecified site: Secondary | ICD-10-CM | POA: Diagnosis not present

## 2015-06-12 MED ORDER — BIAFINE EX EMUL
CUTANEOUS | Status: DC | PRN
  Administered 2015-06-12: 14:00:00 via TOPICAL

## 2015-06-12 NOTE — Progress Notes (Signed)
Oncology Nurse Navigator Documentation  Oncology Nurse Navigator Flowsheets 06/12/2015  Navigator Encounter Type Clinic/MDC;Treatment  Patient Visit Type Radonc  Treatment Phase Treatment  To provide support and encouragement, care continuity, met with patient during 2nd Tomo tmt and during Weekly UT with Dr. Pablo Ledger.  She was accompanied by her husband.  I encouraged husband to watch treatment process so he better understands what his wife is experiencing.  I supported education provided by RTTs.  Patient tolerated tmt without difficulty.  Patient denied any needs/concerns at this time, understands she can contact me if that changes.  Barriers/Navigation Needs -  Time Spent with Patient [place], RN, BSN, [place] at [place] [phone]

## 2015-06-12 NOTE — Progress Notes (Signed)
Weekly Management Note Current Dose: 4  Gy  Projected Dose:40  Gy   Narrative:  The patient presents for routine under treatment assessment.  CBCT/MVCT images/Port film x-rays were reviewed.  The chart was checked. Doing ok. No complaints except claustrophobia in mask.   Physical Findings: Weight: 146 lb 14.4 oz (66.633 kg). Unchanged skin.   Impression:  The patient is tolerating radiation.  Plan:  Continue treatment as planned.

## 2015-06-13 ENCOUNTER — Ambulatory Visit
Admission: RE | Admit: 2015-06-13 | Discharge: 2015-06-13 | Disposition: A | Discharge status: Home / Self Care | Payer: Medicare Other | Source: Ambulatory Visit | Attending: Radiation Oncology | Admitting: Radiation Oncology

## 2015-06-13 DIAGNOSIS — C8330 Diffuse large B-cell lymphoma, unspecified site: Secondary | ICD-10-CM | POA: Diagnosis not present

## 2015-06-14 ENCOUNTER — Ambulatory Visit
Admission: RE | Admit: 2015-06-14 | Discharge: 2015-06-14 | Disposition: A | Discharge status: Home / Self Care | Payer: Medicare Other | Source: Ambulatory Visit | Attending: Radiation Oncology | Admitting: Radiation Oncology

## 2015-06-14 DIAGNOSIS — C8330 Diffuse large B-cell lymphoma, unspecified site: Secondary | ICD-10-CM | POA: Diagnosis not present

## 2015-06-15 ENCOUNTER — Ambulatory Visit
Admission: RE | Admit: 2015-06-15 | Discharge: 2015-06-15 | Disposition: A | Discharge status: Home / Self Care | Payer: Medicare Other | Source: Ambulatory Visit | Attending: Radiation Oncology | Admitting: Radiation Oncology

## 2015-06-15 DIAGNOSIS — C8330 Diffuse large B-cell lymphoma, unspecified site: Secondary | ICD-10-CM | POA: Diagnosis not present

## 2015-06-18 ENCOUNTER — Ambulatory Visit
Admission: RE | Admit: 2015-06-18 | Discharge: 2015-06-18 | Disposition: A | Discharge status: Home / Self Care | Payer: Medicare Other | Source: Ambulatory Visit | Attending: Radiation Oncology | Admitting: Radiation Oncology

## 2015-06-18 DIAGNOSIS — C8330 Diffuse large B-cell lymphoma, unspecified site: Secondary | ICD-10-CM | POA: Diagnosis not present

## 2015-06-19 ENCOUNTER — Encounter: Payer: Self-pay | Admitting: *Deleted

## 2015-06-19 ENCOUNTER — Ambulatory Visit
Admission: RE | Admit: 2015-06-19 | Discharge: 2015-06-19 | Disposition: A | Discharge status: Home / Self Care | Payer: Medicare Other | Source: Ambulatory Visit | Attending: Radiation Oncology | Admitting: Radiation Oncology

## 2015-06-19 VITALS — BP 123/70 | HR 52 | Temp 97.8°F | Wt 144.4 lb

## 2015-06-19 DIAGNOSIS — C8330 Diffuse large B-cell lymphoma, unspecified site: Secondary | ICD-10-CM | POA: Diagnosis not present

## 2015-06-19 NOTE — Progress Notes (Signed)
Weekly assessment of radiation to right neck.Completed 7 of 20 treatments.Denies pain.No oral changes except noticed after taste after meals.Scheduled to see nutritionist Thursday.No questions or concerns at this point.BP 123/70 mmHg  Pulse 52  Temp(Src) 97.8 F (36.6 C)  Wt 144 lb 6.4 oz (65.499 kg)  SpO2 100%

## 2015-06-19 NOTE — Progress Notes (Signed)
  Radiation Oncology         [phone]   Name: Tabitha Jackson MRN: [id number]   Date: 06/19/2015  DOB: 1946-04-06   Weekly Radiation Therapy Management    ICD-9-CM ICD-10-CM   1. Diffuse large B cell lymphoma 202.80 C83.30     Current Dose: 14 Gy  Planned Dose:  40 Gy  Narrative The patient presents for routine under treatment assessment. Denies pain. No oral changes except noticed after taste after meals, where food tastes a bit off. She has dry mouth, using biotine toothpaste and chewing sugarless gum. Scheduled to see nutritionist Thursday. No questions or concerns at this point.  The patient is without complaint. Set-up films were reviewed. The chart was checked.  Physical Findings  weight is 144 lb 6.4 oz (65.499 kg). Her temperature is 97.8 F (36.6 C). Her blood pressure is 123/70 and her pulse is 52. Her oxygen saturation is 100%. . Weight essentially stable.  No significant changes. Minimal hyperpigmentation of right neck. Her biopsy site is well healed. Oral cavity is free of any mucositis or thrush.  Impression The patient is tolerating radiation.  Plan Continue treatment as planned. It is notable that this pt is allergic to hydrocodone in the event that she experiences pain later in the course.     This document serves as a record of services personally performed by Tyler Pita, MD. It was created on his behalf by Arlyce Harman, a trained medical scribe. The creation of this record is based on the scribe's personal observations and the provider's statements to them. This document has been checked and approved by the attending provider.       Tabitha Jackson Tammi Klippel, M.D.

## 2015-06-19 NOTE — Progress Notes (Signed)
Oncology Nurse Navigator Documentation  Oncology Nurse Navigator Flowsheets 06/19/2015  Navigator Encounter Type Clinic/MDC  Patient Visit Type Radonc  To provide support and encouragement, care continuity and to assess for needs, met with patient after Tomo tmt and during WUT with Dr. Tammi Klippel.  She was accompanied by her husband.  She reported:  Tolerating tmts without difficulty.    Dry mouth.  She is using Biotene spray and Biotene toothpaste.  Able to taste foods but is experiencing an unpleasant aftertaste.  Denies sore throat, skin sensitivity. She denied any needs or concerns; I encouraged her to contact me if that changes before I see her next, she verbalized understanding.   Treatment Phase Other  Barriers/Navigation Needs -  Time Spent with Patient [place], RN, BSN, [place] at [place] [phone]

## 2015-06-20 ENCOUNTER — Ambulatory Visit
Admission: RE | Admit: 2015-06-20 | Discharge: 2015-06-20 | Disposition: A | Discharge status: Home / Self Care | Payer: Medicare Other | Source: Ambulatory Visit | Attending: Radiation Oncology | Admitting: Radiation Oncology

## 2015-06-20 DIAGNOSIS — C8330 Diffuse large B-cell lymphoma, unspecified site: Secondary | ICD-10-CM | POA: Diagnosis not present

## 2015-06-21 ENCOUNTER — Ambulatory Visit: Payer: Medicare Other | Admitting: Nutrition

## 2015-06-21 ENCOUNTER — Ambulatory Visit
Admission: RE | Admit: 2015-06-21 | Discharge: 2015-06-21 | Disposition: A | Discharge status: Home / Self Care | Payer: Medicare Other | Source: Ambulatory Visit | Attending: Radiation Oncology | Admitting: Radiation Oncology

## 2015-06-21 DIAGNOSIS — C8330 Diffuse large B-cell lymphoma, unspecified site: Secondary | ICD-10-CM | POA: Diagnosis not present

## 2015-06-21 NOTE — Progress Notes (Signed)
Nutrition follow-up completed with patient receiving radiation therapy for B-cell lymphoma. Patient's weight has decreased and was documented as 144.4 pounds June 28, down from 147.2 pounds June 2 Patient reports she is eating well and exercising. She continues to drink lots of water. She denies nutrition impact symptoms.  Nutrition diagnosis: Food and nutrition related knowledge deficit improved.  Intervention: Patient was educated to continue small frequent meals and snacks, focusing on high-protein foods. Encouraged patient to increase oral intake to maintain weight. Teach back method used and contact information has been given.  Monitoring, evaluation, goals: Patient will continue healthy plant-based diet for weight maintenance.  Next visit Wednesday, July 6 after radiation therapy.  **Disclaimer: This note was dictated with voice recognition software. Similar sounding words can inadvertently be transcribed and this note may contain transcription errors which may not have been corrected upon publication of note.**

## 2015-06-22 ENCOUNTER — Telehealth: Payer: Self-pay | Admitting: Hematology and Oncology

## 2015-06-22 ENCOUNTER — Ambulatory Visit (HOSPITAL_BASED_OUTPATIENT_CLINIC_OR_DEPARTMENT_OTHER): Payer: Medicare Other

## 2015-06-22 ENCOUNTER — Ambulatory Visit
Admission: RE | Admit: 2015-06-22 | Discharge: 2015-06-22 | Disposition: A | Discharge status: Home / Self Care | Payer: Medicare Other | Source: Ambulatory Visit | Attending: Radiation Oncology | Admitting: Radiation Oncology

## 2015-06-22 ENCOUNTER — Ambulatory Visit (HOSPITAL_BASED_OUTPATIENT_CLINIC_OR_DEPARTMENT_OTHER): Payer: Medicare Other | Admitting: Hematology and Oncology

## 2015-06-22 ENCOUNTER — Other Ambulatory Visit (HOSPITAL_BASED_OUTPATIENT_CLINIC_OR_DEPARTMENT_OTHER): Payer: Medicare Other

## 2015-06-22 ENCOUNTER — Encounter: Payer: Self-pay | Admitting: Hematology and Oncology

## 2015-06-22 VITALS — BP 112/66 | HR 59 | Temp 97.7°F | Resp 20 | Ht 65.0 in | Wt 148.0 lb

## 2015-06-22 DIAGNOSIS — C8330 Diffuse large B-cell lymphoma, unspecified site: Secondary | ICD-10-CM | POA: Diagnosis not present

## 2015-06-22 DIAGNOSIS — D630 Anemia in neoplastic disease: Secondary | ICD-10-CM | POA: Diagnosis not present

## 2015-06-22 DIAGNOSIS — R918 Other nonspecific abnormal finding of lung field: Secondary | ICD-10-CM | POA: Diagnosis not present

## 2015-06-22 DIAGNOSIS — D72819 Decreased white blood cell count, unspecified: Secondary | ICD-10-CM

## 2015-06-22 DIAGNOSIS — D701 Agranulocytosis secondary to cancer chemotherapy: Secondary | ICD-10-CM

## 2015-06-22 DIAGNOSIS — T451X5A Adverse effect of antineoplastic and immunosuppressive drugs, initial encounter: Secondary | ICD-10-CM

## 2015-06-22 DIAGNOSIS — C8332 Diffuse large B-cell lymphoma, intrathoracic lymph nodes: Secondary | ICD-10-CM

## 2015-06-22 DIAGNOSIS — Z95828 Presence of other vascular implants and grafts: Secondary | ICD-10-CM

## 2015-06-22 LAB — CBC WITH DIFFERENTIAL/PLATELET
BASO%: 1.4 % (ref 0.0–2.0)
Basophils Absolute: 0 10*3/uL (ref 0.0–0.1)
EOS%: 14.5 % — ABNORMAL HIGH (ref 0.0–7.0)
Eosinophils Absolute: 0.2 10*3/uL (ref 0.0–0.5)
HCT: 32.7 % — ABNORMAL LOW (ref 34.8–46.6)
HGB: 11 g/dL — ABNORMAL LOW (ref 11.6–15.9)
LYMPH%: 24.8 % (ref 14.0–49.7)
MCH: 30.6 pg (ref 25.1–34.0)
MCHC: 33.6 g/dL (ref 31.5–36.0)
MCV: 90.8 fL (ref 79.5–101.0)
MONO#: 0.2 10*3/uL (ref 0.1–0.9)
MONO%: 13.1 % (ref 0.0–14.0)
NEUT#: 0.7 10*3/uL — ABNORMAL LOW (ref 1.5–6.5)
NEUT%: 46.2 % (ref 38.4–76.8)
Platelets: 167 10*3/uL (ref 145–400)
RBC: 3.6 10*6/uL — ABNORMAL LOW (ref 3.70–5.45)
RDW: 13.4 % (ref 11.2–14.5)
WBC: 1.5 10*3/uL — ABNORMAL LOW (ref 3.9–10.3)
lymph#: 0.4 10*3/uL — ABNORMAL LOW (ref 0.9–3.3)

## 2015-06-22 LAB — COMPREHENSIVE METABOLIC PANEL (CC13)
ALT: 17 U/L (ref 0–55)
AST: 19 U/L (ref 5–34)
Albumin: 3.7 g/dL (ref 3.5–5.0)
Alkaline Phosphatase: 68 U/L (ref 40–150)
Anion Gap: 9 mEq/L (ref 3–11)
BUN: 12.2 mg/dL (ref 7.0–26.0)
CO2: 26 mEq/L (ref 22–29)
Calcium: 9.1 mg/dL (ref 8.4–10.4)
Chloride: 106 mEq/L (ref 98–109)
Creatinine: 0.8 mg/dL (ref 0.6–1.1)
EGFR: >90 mL/min/{1.73_m2} (ref >90)
Glucose: 91 mg/dl (ref 70–140)
Potassium: 3.8 mEq/L (ref 3.5–5.1)
Sodium: 141 mEq/L (ref 136–145)
Total Bilirubin: 0.49 mg/dL (ref 0.20–1.20)
Total Protein: 6.2 g/dL — ABNORMAL LOW (ref 6.4–8.3)

## 2015-06-22 MED ORDER — HEPARIN SOD (PORK) LOCK FLUSH 100 UNIT/ML IV SOLN
500.0000 [IU] | Freq: Once | INTRAVENOUS | Status: AC
  Administered 2015-06-22: 500 [IU] via INTRAVENOUS
  Filled 2015-06-22: qty 5

## 2015-06-22 MED ORDER — SODIUM CHLORIDE 0.9 % IJ SOLN
10.0000 mL | INTRAMUSCULAR | Status: DC | PRN
Start: 2015-06-22 — End: 2015-06-22
  Administered 2015-06-22: 10 mL via INTRAVENOUS
  Filled 2015-06-22: qty 10

## 2015-06-22 NOTE — Assessment & Plan Note (Signed)
Overall, she tolerated radiation therapy well. She is asymptomatic from leukopenia. She will finish her radiation treatment within the next 20 half weeks. I plan repeat an imaging study in 3 months to assess response to treatment. If she has no evidence of disease then, we will discontinue routine surveillance imaging. I will probably wait at least a month after radiation is completed before we remove her port

## 2015-06-22 NOTE — Patient Instructions (Signed)

## 2015-06-22 NOTE — Progress Notes (Signed)
[place] OFFICE PROGRESS NOTE  Patient Care Team: Jani Gravel, MD as PCP - General (Internal Medicine) [name], MD as Consulting Physician (Hematology and Oncology) [name], MD as Consulting Physician (Otolaryngology) Leota Sauers, RN as Oncology Nurse Navigator Thea Silversmith, MD as Consulting Physician (Radiation Oncology)  SUMMARY OF ONCOLOGIC HISTORY:   Diffuse large B cell lymphoma   01/31/2015 Imaging CT scan of the neck showed large mass centered around the right parapharyngeal space, measuring approximately 3.6 cm with bulky lymphadenopathy on the right side. Incidentally 2 nodules were found.   02/05/2015 Pathology Results Accession: [id number] biopsy confirmed diffuse large B-cell lymphoma   02/05/2015 Surgery She had excisional lymph node biopsy of the right neck mass   02/16/2015 Imaging  echocardiogram showed preserved ejection fraction.   02/21/2015 Imaging  she had PET CT scan which showed stage II disease.   02/26/2015 Procedure  she had placement of Infuse-a-Port.   03/01/2015 - 04/20/2015 Chemotherapy  she received 3 cycles of R-CHOP chemotherapy with 2 cycles of IT methotrexate   03/22/2015 Adverse Reaction Cycle 2 of RCHOP chemotherapy is delayed due to neutropenia    05/10/2015 Imaging PET scan showed positive response to treatment   06/11/2015 -  Radiation Therapy She received radiation treatment    INTERVAL HISTORY: Please see below for problem oriented charting. She returns for further follow-up. So far, she tolerated radiation therapy well. She denies mucositis or sore throat. Denies dysphagia, nausea or vomiting. She has no signs and symptoms of infection.  REVIEW OF SYSTEMS:   Constitutional: Denies fevers, chills or abnormal weight loss Eyes: Denies blurriness of vision Ears, nose, mouth, throat, and face: Denies mucositis or sore throat Respiratory: Denies cough, dyspnea or wheezes Cardiovascular: Denies palpitation, chest discomfort or lower  extremity swelling Gastrointestinal:  Denies nausea, heartburn or change in bowel habits Skin: Denies abnormal skin rashes Lymphatics: Denies new lymphadenopathy or easy bruising Neurological:Denies numbness, tingling or new weaknesses Behavioral/Psych: Mood is stable, no new changes  All other systems were reviewed with the patient and are negative.  I have reviewed the past medical history, past surgical history, social history and family history with the patient and they are unchanged from previous note.  ALLERGIES:  is allergic to erythromycin; hydrocodone; peanut-containing drug products; and biaxin.  MEDICATIONS:  Current Outpatient Prescriptions  Medication Sig Dispense Refill  . emollient (BIAFINE) cream Apply 90 g topically 2 (two) times daily.    Marland Kitchen lidocaine-prilocaine (EMLA) cream Apply to affected area once 30 g 3   No current facility-administered medications for this visit.    PHYSICAL EXAMINATION: ECOG PERFORMANCE STATUS: 0 - Asymptomatic  Filed Vitals:   06/22/15 0830  BP: 112/66  Pulse: 59  Temp: 97.7 F (36.5 C)  Resp: 20   Filed Weights   06/22/15 0830  Weight: 148 lb (67.132 kg)    GENERAL:alert, no distress and comfortable SKIN: skin color, texture, turgor are normal, no rashes or significant lesions EYES: normal, Conjunctiva are pink and non-injected, sclera clear OROPHARYNX:no exudate, no erythema and lips, buccal mucosa, and tongue normal  NECK: supple, thyroid normal size, non-tender, without nodularity LYMPH:  no palpable lymphadenopathy in the cervical, axillary or inguinal LUNGS: clear to auscultation and percussion with normal breathing effort HEART: regular rate & rhythm and no murmurs and no lower extremity edema ABDOMEN:abdomen soft, non-tender and normal bowel sounds Musculoskeletal:no cyanosis of digits and no clubbing  NEURO: alert & oriented x 3 with fluent speech, no focal motor/sensory deficits  LABORATORY DATA:  I have reviewed  the data as listed    Component Value Date/Time   NA 143 05/10/2015 0813   NA 137 02/26/2015 1330   K 3.9 05/10/2015 0813   K 3.7 02/26/2015 1330   CL 102 02/26/2015 1330   CO2 25 05/10/2015 0813   CO2 29 02/26/2015 1330   GLUCOSE 89 05/10/2015 0813   GLUCOSE 79 02/26/2015 1330   BUN 17.0 05/10/2015 0813   BUN 9 02/26/2015 1330   CREATININE 0.7 05/10/2015 0813   CREATININE 0.72 02/26/2015 1330   CREATININE 0.65 01/08/2015 0946   CALCIUM 9.1 05/10/2015 0813   CALCIUM 9.0 02/26/2015 1330   PROT 6.4 05/10/2015 0813   PROT 6.9 02/19/2015 0720   ALBUMIN 3.7 05/10/2015 0813   ALBUMIN 3.9 02/19/2015 0720   AST 18 05/10/2015 0813   AST 24 02/19/2015 0720   ALT 19 05/10/2015 0813   ALT 19 02/19/2015 0720   ALKPHOS 69 05/10/2015 0813   ALKPHOS 55 02/19/2015 0720   BILITOT 0.40 05/10/2015 0813   BILITOT 0.5 02/19/2015 0720   GFRNONAA 87* 02/26/2015 1330   GFRAA >90 02/26/2015 1330    No results found for: SPEP, UPEP  Lab Results  Component Value Date   WBC 1.5* 06/22/2015   NEUTROABS 0.7* 06/22/2015   HGB 11.0* 06/22/2015   HCT 32.7* 06/22/2015   MCV 90.8 06/22/2015   PLT 167 06/22/2015      Chemistry      Component Value Date/Time   NA 143 05/10/2015 0813   NA 137 02/26/2015 1330   K 3.9 05/10/2015 0813   K 3.7 02/26/2015 1330   CL 102 02/26/2015 1330   CO2 25 05/10/2015 0813   CO2 29 02/26/2015 1330   BUN 17.0 05/10/2015 0813   BUN 9 02/26/2015 1330   CREATININE 0.7 05/10/2015 0813   CREATININE 0.72 02/26/2015 1330   CREATININE 0.65 01/08/2015 0946      Component Value Date/Time   CALCIUM 9.1 05/10/2015 0813   CALCIUM 9.0 02/26/2015 1330   ALKPHOS 69 05/10/2015 0813   ALKPHOS 55 02/19/2015 0720   AST 18 05/10/2015 0813   AST 24 02/19/2015 0720   ALT 19 05/10/2015 0813   ALT 19 02/19/2015 0720   BILITOT 0.40 05/10/2015 0813   BILITOT 0.5 02/19/2015 0720      ASSESSMENT & PLAN:  Diffuse large B cell lymphoma Overall, she tolerated radiation therapy  well. She is asymptomatic from leukopenia. She will finish her radiation treatment within the next 20 half weeks. I plan repeat an imaging study in 3 months to assess response to treatment. If she has no evidence of disease then, we will discontinue routine surveillance imaging. I will probably wait at least a month after radiation is completed before we remove her port  Anemia in neoplastic disease This is likely due to recent treatment. The patient denies recent history of bleeding such as epistaxis, hematuria or hematochezia. She is asymptomatic from the anemia. I will observe for now.    Leukopenia due to antineoplastic chemotherapy This is likely due to recent treatment. The patient denies recent history of fevers, cough, chills, diarrhea or dysuria. She is asymptomatic from the leukopenia. I will observe for now.      Multiple lung nodules on CT This was seen on recent imaging study. The patient is currently asymptomatic. I will monitor this with next imaging study.     Orders Placed This Encounter  Procedures  . NM PET Image Restag (  PS) Skull Base To Thigh    Standing Status: Future     Number of Occurrences:      Standing Expiration Date: 08/21/2016    Order Specific Question:  Reason for Exam (SYMPTOM  OR DIAGNOSIS REQUIRED)    Answer:  staging lymphoma, assess response to treatment    Order Specific Question:  Preferred imaging location?    Answer:  [facility]   All questions were answered. The patient knows to call the clinic with any problems, questions or concerns. No barriers to learning was detected. I spent 15 minutes counseling the patient face to face. The total time spent in the appointment was 20 minutes and more than 50% was on counseling and review of test results     [facility], [place], MD 06/22/2015 8:46 AM

## 2015-06-22 NOTE — Assessment & Plan Note (Signed)
This was seen on recent imaging study. The patient is currently asymptomatic. I will monitor this with next imaging study.

## 2015-06-22 NOTE — Assessment & Plan Note (Signed)
This is likely due to recent treatment. The patient denies recent history of fevers, cough, chills, diarrhea or dysuria. She is asymptomatic from the leukopenia. I will observe for now.    

## 2015-06-22 NOTE — Assessment & Plan Note (Signed)
This is likely due to recent treatment. The patient denies recent history of bleeding such as epistaxis, hematuria or hematochezia. She is asymptomatic from the anemia. I will observe for now.   

## 2015-06-22 NOTE — Telephone Encounter (Signed)
Gave and printed appt sched and avs fo rpt for July and OCT

## 2015-06-26 ENCOUNTER — Ambulatory Visit
Admission: RE | Admit: 2015-06-26 | Discharge: 2015-06-26 | Disposition: A | Discharge status: Home / Self Care | Payer: Medicare Other | Source: Ambulatory Visit | Attending: Radiation Oncology | Admitting: Radiation Oncology

## 2015-06-26 VITALS — BP 132/82 | HR 58 | Temp 98.2°F | Wt 146.4 lb

## 2015-06-26 DIAGNOSIS — C8330 Diffuse large B-cell lymphoma, unspecified site: Secondary | ICD-10-CM

## 2015-06-26 MED ORDER — FLUCONAZOLE 100 MG PO TABS: 100.0000 mg | ORAL_TABLET | Freq: Every day | ORAL | Status: DC

## 2015-06-26 NOTE — Progress Notes (Signed)
Weekly Management Note Current Dose: 22 Gy  Projected Dose: 40 Gy   Narrative:  The patient presents for routine under treatment assessment.  CBCT/MVCT images/Port film x-rays were reviewed.  The chart was checked. Doing well. Sore mouth. Counts low per Alvy Bimler so mask in place.   Physical Findings: Weight: 146 lb 6.4 oz (66.407 kg). Mucocitis posteriorly in oropharynx bilaterally. ? Thrush as well. Slightly dark right neck skin.   Impression:  The patient is tolerating radiation.  Plan:  Continue treatment as planned. Diflucan x 7 days.

## 2015-06-27 ENCOUNTER — Ambulatory Visit: Payer: Medicare Other | Admitting: Nutrition

## 2015-06-27 ENCOUNTER — Ambulatory Visit
Admission: RE | Admit: 2015-06-27 | Discharge: 2015-06-27 | Disposition: A | Discharge status: Home / Self Care | Payer: Medicare Other | Source: Ambulatory Visit | Attending: Radiation Oncology | Admitting: Radiation Oncology

## 2015-06-27 DIAGNOSIS — C8330 Diffuse large B-cell lymphoma, unspecified site: Secondary | ICD-10-CM | POA: Diagnosis not present

## 2015-06-27 NOTE — Progress Notes (Signed)
Nutrition follow-up completed with patient after radiation therapy for B-cell lymphoma. Patient's weight is relatively stable overall was documented as 146.4 pounds July 5. Patient reports she continues to eat well and is now exercising at home instead of at the gym secondary to neutropenia. Patient reports her mouth is sore and she is beginning to get dry mouth. Patient is not consuming oral nutrition supplements as she prefers to eat food at this time.  Nutrition diagnosis: Food and nutrition related knowledge deficit improved.  Intervention: Educated patient on strategies for eating with sore mouth and throat.  I provided fact sheet. Educated patient on strategies for improving thickened saliva.  I provided fact sheet. Encouraged increased hydration. Encouraged patient to continue adequate oral intake.  No need for nutrition supplements at this time. Questions were answered.  Teach back method used.  Monitoring, evaluation, goals: Patient will work to continue adequate calories and protein to promote weight maintenance.  Next visit: Patient will contact me for questions or concerns  **Disclaimer: This note was dictated with voice recognition software. Similar sounding words can inadvertently be transcribed and this note may contain transcription errors which may not have been corrected upon publication of note.**

## 2015-06-28 ENCOUNTER — Ambulatory Visit
Admission: RE | Admit: 2015-06-28 | Discharge: 2015-06-28 | Disposition: A | Discharge status: Home / Self Care | Payer: Medicare Other | Source: Ambulatory Visit | Attending: Radiation Oncology | Admitting: Radiation Oncology

## 2015-06-28 ENCOUNTER — Telehealth: Payer: Self-pay | Admitting: *Deleted

## 2015-06-28 DIAGNOSIS — C8330 Diffuse large B-cell lymphoma, unspecified site: Secondary | ICD-10-CM | POA: Diagnosis not present

## 2015-06-28 NOTE — Telephone Encounter (Signed)
Oncology Nurse Navigator Documentation  Oncology Nurse Navigator Flowsheets 06/28/2015  Navigator Encounter Type Telephone  Patient called to report the recent appearance of several blister-like sores on R side of her tongue.   She reported:  Not affecting her ability to eat/drink.  She minimizes discomfort by favoring L side of mouth.    She is using saltwater/baking soda rinse multiple times daily. We discussed that this is an expected SE from radiation, She asked if there is additional medication to help with discomfort.   I encouraged her to ask to be seen by MD tomorrow after RT for evaluation.  She verbalized understanding.  Patient Visit Type -  Treatment Phase -  Barriers/Navigation Needs -  Time Spent with Patient [place], RN, BSN, [place] at [place] [phone]

## 2015-06-29 ENCOUNTER — Other Ambulatory Visit: Payer: Self-pay | Admitting: Radiation Oncology

## 2015-06-29 ENCOUNTER — Ambulatory Visit
Admission: RE | Admit: 2015-06-29 | Discharge: 2015-06-29 | Disposition: A | Discharge status: Home / Self Care | Payer: Medicare Other | Source: Ambulatory Visit | Attending: Radiation Oncology | Admitting: Radiation Oncology

## 2015-06-29 ENCOUNTER — Encounter: Payer: Self-pay | Admitting: *Deleted

## 2015-06-29 DIAGNOSIS — C8330 Diffuse large B-cell lymphoma, unspecified site: Secondary | ICD-10-CM

## 2015-06-29 MED ORDER — LIDOCAINE VISCOUS 2 % MT SOLN: 10.0000 mL | Freq: Four times a day (QID) | OROMUCOSAL | Status: DC | PRN

## 2015-06-29 NOTE — Progress Notes (Signed)
Patient brought to nursing for assessment of oral cavity, side of tongue is sore and she had episode of bleeding wondering if this is normal.oral cavity much improved since Tuesday at which time she was started on diflucan and has one more day remaining.Continue with baking soda rinse.Dr.Kinard will electronically prescribe viscous lidocaine 2 % to take qid before meals to help prevent burning on eating.No more visible white plaque, patient just has small are on lateral tongue.Orthostaic vitals were good.Patient knows to see me on Monday 07/02/15 if any questions or concerns. Wt Readings from Last 3 Encounters:  06/29/15 144 lb 4.8 oz (65.454 kg)  06/26/15 146 lb 6.4 oz (66.407 kg)  06/22/15 148 lb (67.132 kg)

## 2015-06-30 NOTE — Progress Notes (Signed)
Oncology Nurse Navigator Documentation  Oncology Nurse Navigator Flowsheets 06/29/2015  Navigator Encounter Type Treatment  Met patient prior to Tomo tmt in follow-up to her call yesterday. She reported bloody sputum this morning with a small nose bleed.  I arranged for her to be seen by RadOnc MD, escorted her to nursing following Tomo.   Gayleen Orem, RN, BSN, [place] at [place] [phone]

## 2015-07-02 ENCOUNTER — Ambulatory Visit: Payer: Medicare Other

## 2015-07-02 ENCOUNTER — Ambulatory Visit
Admission: RE | Admit: 2015-07-02 | Discharge: 2015-07-02 | Disposition: A | Discharge status: Home / Self Care | Payer: Medicare Other | Source: Ambulatory Visit | Attending: Radiation Oncology | Admitting: Radiation Oncology

## 2015-07-02 DIAGNOSIS — C8330 Diffuse large B-cell lymphoma, unspecified site: Secondary | ICD-10-CM | POA: Diagnosis not present

## 2015-07-03 ENCOUNTER — Ambulatory Visit
Admission: RE | Admit: 2015-07-03 | Discharge: 2015-07-03 | Disposition: A | Discharge status: Home / Self Care | Payer: Medicare Other | Source: Ambulatory Visit | Attending: Radiation Oncology | Admitting: Radiation Oncology

## 2015-07-03 ENCOUNTER — Encounter: Payer: Self-pay | Admitting: *Deleted

## 2015-07-03 VITALS — BP 132/70 | HR 52 | Temp 98.4°F | Wt 141.8 lb

## 2015-07-03 DIAGNOSIS — C8330 Diffuse large B-cell lymphoma, unspecified site: Secondary | ICD-10-CM

## 2015-07-03 NOTE — Progress Notes (Signed)
Weekly Management Note Current Dose: 32Gy  Projected Dose:40 Gy   Narrative:  The patient presents for routine under treatment assessment.  CBCT/MVCT images/Port film x-rays were reviewed.  The chart was checked. Sores on tongue and decreased taste. Lidocaine useful for tongue.   Physical Findings:  Mucositis over right lateral tongue.   Vitals:  Filed Vitals:   07/03/15 1019  BP: 132/70  Pulse: 52  Temp:    Weight:  Wt Readings from Last 3 Encounters:  07/03/15 141 lb 12.8 oz (64.32 kg)  06/29/15 144 lb 4.8 oz (65.454 kg)  06/26/15 146 lb 6.4 oz (66.407 kg)   Lab Results  Component Value Date   WBC 1.5* 06/22/2015   HGB 11.0* 06/22/2015   HCT 32.7* 06/22/2015   MCV 90.8 06/22/2015   PLT 167 06/22/2015   Lab Results  Component Value Date   CREATININE 0.8 06/22/2015   BUN 12.2 06/22/2015   NA 141 06/22/2015   K 3.8 06/22/2015   CL 102 02/26/2015   CO2 26 06/22/2015     Impression:  The patient is tolerating radiation.  Plan:  Continue treatment as planned. Continue lidocaine. Has appt with Dr. Alvy Bimler. Follow up with me in 1 month. Discussed zinc or lemon drops for taste.

## 2015-07-03 NOTE — Progress Notes (Signed)
  Oncology Nurse Navigator Documentation   Navigator Encounter Type: Treatment (07/03/15 1000)      To provide support and encouragement, care continuity and to assess for needs, met with patient during weekly UT with Dr. Pablo Ledger.  She reported:  Continuing tongue pain/discomfort.  She is obtaining relief with newly prescribed lidocaine.  Loss of taste "except for plain hot dogs".  She understands this is an expected SE of RT, that sense of taste will gradually return.  She verbalized understanding of Dr. Unknown Jim suggestion that sour lemon drops or zinc tablets may help this recovery. She denied any further needs or concerns; I encouraged her to contact me if that changes before I see her next, she verbalized understanding.   Gayleen Orem, RN, BSN, [place] [facility] [place] [phone]

## 2015-07-03 NOTE — Progress Notes (Signed)
Wt Readings from Last 3 Encounters:  07/03/15 141 lb 12.8 oz (64.32 kg)  06/29/15 144 lb 4.8 oz (65.454 kg)  06/26/15 146 lb 6.4 oz (66.407 kg)   BP 132/70 mmHg  Pulse 52  Temp(Src) 98.4 F (36.9 C)  Wt 141 lb 12.8 oz (64.32 kg)   Patient here for weekly assessment.States pain leel "6' but doesn't want any pain medication.Completed diflucan on Saturday.oral cavity has improved significantly.Lidocaine has helped with burning of tongue.

## 2015-07-04 ENCOUNTER — Ambulatory Visit
Admission: RE | Admit: 2015-07-04 | Discharge: 2015-07-04 | Disposition: A | Discharge status: Home / Self Care | Payer: Medicare Other | Source: Ambulatory Visit | Attending: Radiation Oncology | Admitting: Radiation Oncology

## 2015-07-04 ENCOUNTER — Encounter: Payer: Medicare Other | Admitting: Nutrition

## 2015-07-04 DIAGNOSIS — C8330 Diffuse large B-cell lymphoma, unspecified site: Secondary | ICD-10-CM | POA: Diagnosis not present

## 2015-07-05 ENCOUNTER — Ambulatory Visit
Admission: RE | Admit: 2015-07-05 | Discharge: 2015-07-05 | Disposition: A | Discharge status: Home / Self Care | Payer: Medicare Other | Source: Ambulatory Visit | Attending: Radiation Oncology | Admitting: Radiation Oncology

## 2015-07-05 ENCOUNTER — Telehealth: Payer: Self-pay | Admitting: *Deleted

## 2015-07-05 DIAGNOSIS — C8330 Diffuse large B-cell lymphoma, unspecified site: Secondary | ICD-10-CM | POA: Diagnosis not present

## 2015-07-05 NOTE — Telephone Encounter (Signed)
Pt states she does not return to see Dr. Alvy Bimler until October.  She will complete Radiation treatments on Monday 7/18.   She asks if she still has to wear a mask when she leaves the house.  Instructed pt it is wise to continue to wear a mask if she needs to go to hospital or MD waiting room,  Since more chance of being around someone who it sick there.  She does not need to wear a mask just to leave the house.  Try to avoid large crowds and don't be around anyone who is obviously sick.   Good handwashing for her and her friends and family is very important.  Please call us if any fevers or any signs of infection before her next visit.  She verbalized understanding.

## 2015-07-06 ENCOUNTER — Ambulatory Visit
Admission: RE | Admit: 2015-07-06 | Discharge: 2015-07-06 | Disposition: A | Discharge status: Home / Self Care | Payer: Medicare Other | Source: Ambulatory Visit | Attending: Radiation Oncology | Admitting: Radiation Oncology

## 2015-07-06 DIAGNOSIS — C8330 Diffuse large B-cell lymphoma, unspecified site: Secondary | ICD-10-CM | POA: Diagnosis not present

## 2015-07-09 ENCOUNTER — Ambulatory Visit
Admission: RE | Admit: 2015-07-09 | Discharge: 2015-07-09 | Disposition: A | Discharge status: Home / Self Care | Payer: Medicare Other | Source: Ambulatory Visit | Attending: Radiation Oncology | Admitting: Radiation Oncology

## 2015-07-09 ENCOUNTER — Encounter: Payer: Self-pay | Admitting: Radiation Oncology

## 2015-07-09 ENCOUNTER — Encounter: Payer: Self-pay | Admitting: *Deleted

## 2015-07-09 VITALS — BP 143/72 | HR 54 | Temp 98.0°F | Ht 65.0 in | Wt 137.8 lb

## 2015-07-09 DIAGNOSIS — C8330 Diffuse large B-cell lymphoma, unspecified site: Secondary | ICD-10-CM

## 2015-07-09 NOTE — Progress Notes (Signed)
  Oncology Nurse Navigator Documentation   Navigator Encounter Type: Clinic/MDC (07/09/15 0920)    Met with pt during final RT to offer support and to celebrate end of radiation treatment.  She was accompanied by her husband. 1. I provided husband with a Certificate of Recognition. 2. I provided post-RT guidance:  Importance of maintaining nutritional intake to help with healing despite absence of taste.  Importance of protecting treatment area from sun.  Continuation of Biafine application 2-3 times daily. 3. I explained that my role as navigator will continue for several more months and that I will be calling and/or joining her during follow-up visits.   4. I encouraged her to call me with needs/concerns.   5. Patient and husband verbalized understanding of information provided. 6. I escorted patient to [place] to be seen by Dr. Valere Dross.   Gayleen Orem, RN, BSN, [place] at [place] [phone]

## 2015-07-09 NOTE — Progress Notes (Signed)
Weekly Management Note:  Site: Right neck Current Dose:  4000  cGy Projected Dose: 4000  cGy  Narrative: The patient is seen today for routine under treatment assessment. CBCT/MVCT images/port films were reviewed. The chart was reviewed.   The patient seen today on her last day of radiation therapy.  She is doing well although she has occasional blood streaks when she blows her nose, and occasionally when she clears her throat.  Her CBC 2 weeks ago was satisfactory.  Her taste remains altered and her weight is down approximately 4 pounds over the past week and approximately 7 pounds over the past 10 days.  She is not having any significant discomfort when swallowing and she is on no pain medication.  She uses Biotine mouthwash which she finds to be helpful.  Physical Examination:  Filed Vitals:   07/09/15 1014  BP: 143/72  Pulse: 54  Temp: 98 F (36.7 C)  .  Weight: 137 lb 12.8 oz (62.506 kg).  There is moderate mucositis along the right oropharynx without any obvious bleeding.  There is mild hyperpigmentation of skin along the right neck with no areas of desquamation.  Lab Results  Component Value Date   WBC 1.5* 06/22/2015   HGB 11.0* 06/22/2015   HCT 32.7* 06/22/2015   MCV 90.8 06/22/2015   PLT 167 06/22/2015     Impression: Satisfactory progress.  Radiation therapy completed.  Plan: One-month follow-up to see Dr. Pablo Ledger.

## 2015-07-09 NOTE — Progress Notes (Signed)
Ms. Haydon has completed XRT to her right neck.  Note mucositis on the right lateral tongue. Note hyperpigmentation of her tx field, but skin moist and intact.  She reports lack of taste when eating.      Wt Readings from Last 3 Encounters:  07/09/15 137 lb 12.8 oz (62.506 kg)  07/03/15 141 lb 12.8 oz (64.32 kg)  06/29/15 144 lb 4.8 oz (65.454 kg)

## 2015-07-17 NOTE — Progress Notes (Signed)
  Radiation Oncology         (336) [phone] ________________________________  Name: Tabitha Jackson MRN: [id number]  Date: 07/09/2015  DOB: [birth year]  End of Treatment Note  Diagnosis:   Diffuse large B cell lymphoma   Staging form: Lymphoid Neoplasms, AJCC 6th Edition     Clinical stage from 02/23/2015: Stage II - Signed by [name], MD on 02/23/2015     Indication for treatment:  Curative  Radiation treatment dates:   06/11/2015-07/09/2015  Site/dose:   Right neck/ 40 Gy at 2 Gy per fraction x 20 fractions  Beams/energy:   IMRT was utlized for parotid sparing and to spare the oral cavity with helical tomotherapy and 6 MV. Daily MVCT was utilized for target localization.   Narrative: The patient tolerated radiation treatment relatively well.   She had taste changes and some decreased appetite.  She did not experience sore throat.  She lost 7 pounds during treatment.   Plan: The patient has completed radiation treatment. The patient will return to radiation oncology clinic for routine followup in one month. I advised them to call or return sooner if they have any questions or concerns related to their recovery or treatment.  ------------------------------------------------  Thea Silversmith, MD

## 2015-07-19 ENCOUNTER — Telehealth: Payer: Self-pay | Admitting: *Deleted

## 2015-07-19 NOTE — Telephone Encounter (Signed)
  Oncology Nurse Navigator Documentation   Navigator Encounter Type: Telephone (07/19/15 1632)      Called patient to check on well-being since completion of RT last week.   She reported:  "I feel good."   Sore throat getting better.  She has not needed to use lidocaine.  Beef hotdog is only food that "tastes good" but she forces herself to eat other foods.  We discussed that the return of sense of taste may take several more weeks/months.  Thickened saliva is worse in morning when she wakes up.  Applying Biafine daily.  I encouraged her to apply at least BID for next couple of weeks. She understands she can contact me/Dr. Unknown Jim office prior to her 8/25 appt with needs or concerns.   Gayleen Orem, RN, BSN, [place] at [place] [phone]

## 2015-08-14 ENCOUNTER — Other Ambulatory Visit: Payer: Self-pay | Admitting: Hematology and Oncology

## 2015-08-14 IMAGING — US IR FLUORO GUIDE CV LINE*R*
1 series · 2 of 2 positions shown · non-contrast
Comparison: None.

INDICATION: History of lymphoma. In need of durable intravenous access for
chemotherapy administration.

EXAM:
IMPLANTED PORT A CATH PLACEMENT WITH ULTRASOUND AND FLUOROSCOPIC
GUIDANCE

[Series 1: ir fluoro guide cv line*right* · 2 of 2 slices shown]
[im 1/2]
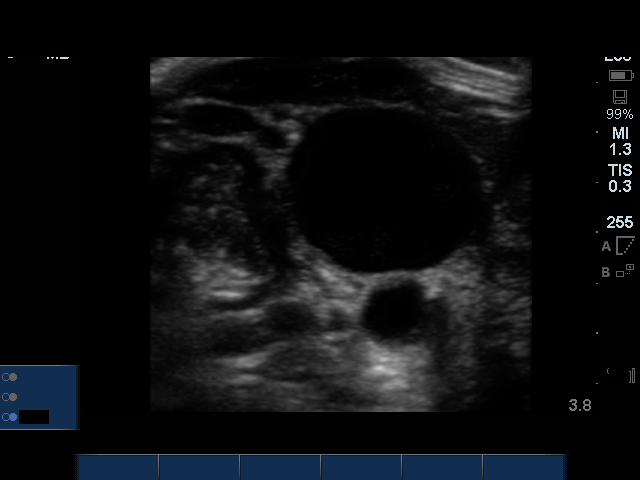
[im 2/2]
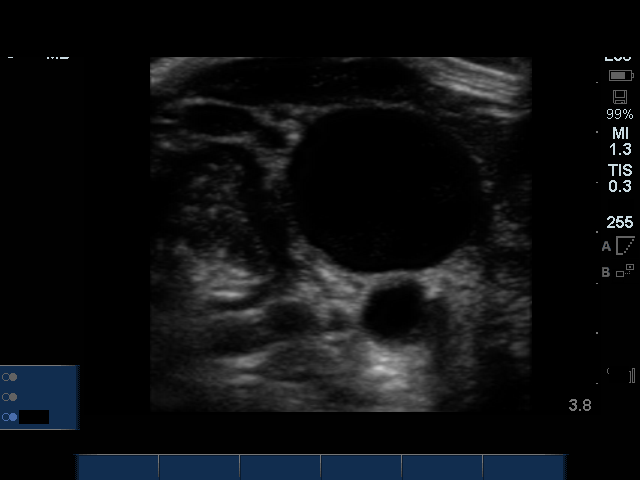

[2 of 2 positions shown; findings below may reference images not displayed]

MEDICATIONS:
Ancef 2 gm IV; The antibiotic was administered within an appropriate
time interval prior to skin puncture.

ANESTHESIA/SEDATION:
Versed 4 mg IV; Fentanyl 100 mcg IV;

Total Moderate Sedation Time

23  minutes.

CONTRAST:  None

FLUOROSCOPY TIME:  12 seconds (2 mGy).

COMPLICATIONS:
None immediate

PROCEDURE:
The procedure, risks, benefits, and alternatives were explained to
the patient. Questions regarding the procedure were encouraged and
answered. The patient understands and consents to the procedure.

The right neck and chest were prepped with chlorhexidine in a
sterile fashion, and a sterile drape was applied covering the
operative field. Maximum barrier sterile technique with sterile
gowns and gloves were used for the procedure. A timeout was
performed prior to the initiation of the procedure. Local anesthesia
was provided with 1% lidocaine with epinephrine.

After creating a small venotomy incision, a micropuncture kit was
utilized to access the internal jugular vein under direct, real-time
ultrasound guidance. Ultrasound image documentation was performed.
The microwire was kinked to measure appropriate catheter length.

A subcutaneous port pocket was then created along the upper chest
wall utilizing a combination of sharp and blunt dissection. The
pocket was irrigated with sterile saline. A single lumen thin power
injectable port was chosen for placement. The 8 Fr catheter was
tunneled from the port pocket site to the venotomy incision. The
port was placed in the pocket. The external catheter was trimmed to
appropriate length. At the venotomy, an 8 Fr peel-away sheath was
placed over a guidewire under fluoroscopic guidance. The catheter
was then placed through the sheath and the sheath was removed. Final
catheter positioning was confirmed and documented with a
fluoroscopic spot radiograph. The port was accessed with Metallib Schmasin
needle, aspirated and flushed with heparinized saline.

The venotomy site was closed with an interrupted 4-0 Vicryl suture.
The port pocket incision was closed with interrupted 2-0 Vicryl
suture and the skin was opposed with a running subcuticular 4-0
Vicryl suture. Dermabond and Forst were applied to both
incisions. Dressings were placed. The patient tolerated the
procedure well without immediate post procedural complication.
FINDINGS: After catheter placement, the tip lies within the superior
cavoatrial junction. The catheter aspirates and flushes normally and
is ready for immediate use.
IMPRESSION: Successful placement of a right internal jugular approach power
injectable Port-A-Cath. The catheter is ready for immediate use.

## 2015-08-16 ENCOUNTER — Encounter: Payer: Self-pay | Admitting: *Deleted

## 2015-08-16 ENCOUNTER — Other Ambulatory Visit: Payer: Self-pay | Admitting: *Deleted

## 2015-08-16 ENCOUNTER — Ambulatory Visit
Admission: RE | Admit: 2015-08-16 | Discharge: 2015-08-16 | Disposition: A | Discharge status: Home / Self Care | Payer: Medicare Other | Source: Ambulatory Visit | Attending: Radiation Oncology | Admitting: Radiation Oncology

## 2015-08-16 VITALS — BP 106/77 | HR 54 | Temp 98.3°F | Wt 140.0 lb

## 2015-08-16 DIAGNOSIS — C8330 Diffuse large B-cell lymphoma, unspecified site: Secondary | ICD-10-CM

## 2015-08-16 NOTE — Progress Notes (Signed)
   Department of Radiation Oncology  Phone:  [phone] Fax:        [phone]   Name: Tabitha Jackson MRN: [id number]  DOB: [birth year]  Date: 08/16/2015  Follow Up Visit Note  Diagnosis: Diffuse large B cell lymphoma   Staging form: Lymphoid Neoplasms, AJCC 6th Edition     Clinical stage from 02/23/2015: Stage II - Signed by [name], MD on 02/23/2015  Summary and Interval since last radiation: 6 weeks. 06/11/2015-07/09/2015 Site/dose:   Right neck/ 40 Gy at 2 Gy per fraction x 20 fractions  Interval History: Tabitha Jackson presents today for routine followup. Denies neck pain but has some hip discomfort at times. She continues to have a dry mouth, thick saliva, and diminished taste buds. She swallows better and has done well since txt. She is scheduled to see Dr. Alvy Bimler in October not had any recent imaging. She is scheduled for a PET scan in October as well. She would like her port-a-cath removed if possible and still exercising everyday.    Physical Exam:  Filed Vitals:   08/16/15 1328  BP: 106/77  Pulse: 54  Temp: 98.3 F (36.8 C)  Weight: 140 lb (63.504 kg)  SpO2: 100%  Alert and oriented times three. In no distress. Mild hyperpigmentation over the right neck.  IMPRESSION: Tabitha Jackson is a 69 y.o. female with Stage II Diffuse Large B Cell Lymphoma. Healing from radiotherapy.   PLAN: I encouraged her to continue using vitamin E lotion on her neck. She will f/u with Dr. Alvy Bimler as scheduled. I will see her on a PRN basis. I advised her to use proper sun hygiene. She should call me with any questions or concerns she might have. Gayleen Orem, RN, our Head and Neck Oncology Navigator will f/u with her regarding her schedule for port-a-cath flush and PET scan.   This document serves as a record of services personally performed by Thea Silversmith, MD. It was created on her behalf by Darcus Austin, a trained medical scribe. The creation of this record is based on the scribe's personal observations and  the provider's statements to them. This document has been checked and approved by the attending provider.   Thea Silversmith, MD

## 2015-08-16 NOTE — Progress Notes (Signed)
  Oncology Nurse Navigator Documentation    Navigator Encounter Type: Clinic/MDC (08/16/15 1350) Patient Visit Type: Radonc (08/16/15 1350) Treatment Phase: Other (1 month follow-up) (08/16/15 1350)       To provide care continuity and to assess for needs, met with patient during her 1 month f/u appt with Dr. Pablo Ledger. She denied any needs or concerns; I encouraged her to contact me if that changes before I see her next, she verbalized understanding. S/p appt, spoke with Irine Seal, Radiology Scheduling, to facilitate scheduling of October PET per Dr. Calton Dach 06/22/15 order; facilitated scheduling of PAC flushes with Ellery Plunk, RN. We discussed upcoming H&N [facility], she stated she and husband will attend.  I notified Polo Riley, LCSW, program coordinator.      Gayleen Orem, RN, BSN, [place] [facility] [place] [phone]           Time Spent with Patient: 30 (08/16/15 1350)

## 2015-08-16 NOTE — Progress Notes (Signed)
One month follow up completion of radiation to right neck.Denies neck pain but has some hip discomfort at times.Skin has completely healed.Oral mucosa is clear.Continues to have thick saliva and diminished taste buds. BP 106/77 mmHg  Pulse 54  Temp(Src) 98.3 F (36.8 C)  Wt 140 lb (63.504 kg)  SpO2 100%  Wt Readings from Last 3 Encounters:  08/16/15 140 lb (63.504 kg)  07/09/15 137 lb 12.8 oz (62.506 kg)  07/03/15 141 lb 12.8 oz (64.32 kg)

## 2015-08-17 ENCOUNTER — Telehealth: Payer: Self-pay | Admitting: Hematology and Oncology

## 2015-08-17 NOTE — Telephone Encounter (Signed)
S/w pt confirming flush added per 08/25 POF.... Cherylann Banas

## 2015-08-21 ENCOUNTER — Ambulatory Visit (HOSPITAL_BASED_OUTPATIENT_CLINIC_OR_DEPARTMENT_OTHER): Payer: Medicare Other

## 2015-08-21 DIAGNOSIS — Z452 Encounter for adjustment and management of vascular access device: Secondary | ICD-10-CM | POA: Diagnosis not present

## 2015-08-21 DIAGNOSIS — Z95828 Presence of other vascular implants and grafts: Secondary | ICD-10-CM

## 2015-08-21 DIAGNOSIS — C8332 Diffuse large B-cell lymphoma, intrathoracic lymph nodes: Secondary | ICD-10-CM | POA: Diagnosis not present

## 2015-08-21 MED ORDER — SODIUM CHLORIDE 0.9 % IJ SOLN
10.0000 mL | INTRAMUSCULAR | Status: DC | PRN
  Administered 2015-08-21: 10 mL via INTRAVENOUS
  Filled 2015-08-21: qty 10

## 2015-08-21 MED ORDER — HEPARIN SOD (PORK) LOCK FLUSH 100 UNIT/ML IV SOLN
500.0000 [IU] | Freq: Once | INTRAVENOUS | Status: AC
Start: 2015-08-21 — End: 2015-08-21
  Administered 2015-08-21: 500 [IU] via INTRAVENOUS
  Filled 2015-08-21: qty 5

## 2015-08-21 NOTE — Patient Instructions (Signed)

## 2015-09-05 DIAGNOSIS — C8590 Non-Hodgkin lymphoma, unspecified, unspecified site: Secondary | ICD-10-CM | POA: Diagnosis not present

## 2015-09-05 DIAGNOSIS — Z0000 Encounter for general adult medical examination without abnormal findings: Secondary | ICD-10-CM | POA: Diagnosis not present

## 2015-09-05 DIAGNOSIS — L739 Follicular disorder, unspecified: Secondary | ICD-10-CM | POA: Diagnosis not present

## 2015-09-05 DIAGNOSIS — R739 Hyperglycemia, unspecified: Secondary | ICD-10-CM | POA: Diagnosis not present

## 2015-10-03 ENCOUNTER — Telehealth: Payer: Self-pay | Admitting: Hematology and Oncology

## 2015-10-03 ENCOUNTER — Encounter: Payer: Self-pay | Admitting: *Deleted

## 2015-10-03 NOTE — Telephone Encounter (Signed)
s.w. t and advised on time change on 10.18 per mD....pt ok and aware

## 2015-10-03 NOTE — Progress Notes (Signed)
Tabitha Jackson and her husband caregiver attended the Fall 2016 5-week H&N [facility] program (Tuesday evening sessions, 6:00-7:15 pm, [facility], beginning 09/04/15).  Gayleen Orem, RN, BSN, [place] at [place] [phone]

## 2015-10-08 ENCOUNTER — Ambulatory Visit (HOSPITAL_COMMUNITY)
Admission: RE | Admit: 2015-10-08 | Discharge: 2015-10-08 | Disposition: A | Discharge status: Home / Self Care | Payer: Medicare Other | Source: Ambulatory Visit | Attending: Hematology and Oncology | Admitting: Hematology and Oncology

## 2015-10-08 ENCOUNTER — Other Ambulatory Visit (HOSPITAL_BASED_OUTPATIENT_CLINIC_OR_DEPARTMENT_OTHER): Payer: Medicare Other

## 2015-10-08 ENCOUNTER — Ambulatory Visit: Payer: Medicare Other

## 2015-10-08 ENCOUNTER — Other Ambulatory Visit: Payer: Self-pay | Admitting: Hematology and Oncology

## 2015-10-08 DIAGNOSIS — I7090 Unspecified atherosclerosis: Secondary | ICD-10-CM | POA: Diagnosis not present

## 2015-10-08 DIAGNOSIS — C8590 Non-Hodgkin lymphoma, unspecified, unspecified site: Secondary | ICD-10-CM | POA: Diagnosis not present

## 2015-10-08 DIAGNOSIS — C8330 Diffuse large B-cell lymphoma, unspecified site: Secondary | ICD-10-CM | POA: Diagnosis not present

## 2015-10-08 DIAGNOSIS — Z08 Encounter for follow-up examination after completed treatment for malignant neoplasm: Secondary | ICD-10-CM | POA: Diagnosis not present

## 2015-10-08 DIAGNOSIS — M899 Disorder of bone, unspecified: Secondary | ICD-10-CM | POA: Diagnosis not present

## 2015-10-08 DIAGNOSIS — C8338 Diffuse large B-cell lymphoma, lymph nodes of multiple sites: Secondary | ICD-10-CM

## 2015-10-08 DIAGNOSIS — Z95828 Presence of other vascular implants and grafts: Secondary | ICD-10-CM

## 2015-10-08 LAB — CBC WITH DIFFERENTIAL/PLATELET
BASO%: 0 % (ref 0.0–2.0)
Basophils Absolute: 0 10*3/uL (ref 0.0–0.1)
EOS%: 3.1 % (ref 0.0–7.0)
Eosinophils Absolute: 0 10*3/uL (ref 0.0–0.5)
HCT: 35.5 % (ref 34.8–46.6)
HGB: 11.8 g/dL (ref 11.6–15.9)
LYMPH%: 36.6 % (ref 14.0–49.7)
MCH: 28.6 pg (ref 25.1–34.0)
MCHC: 33.2 g/dL (ref 31.5–36.0)
MCV: 86.2 fL (ref 79.5–101.0)
MONO#: 0.2 10*3/uL (ref 0.1–0.9)
MONO%: 16 % — ABNORMAL HIGH (ref 0.0–14.0)
NEUT#: 0.6 10*3/uL — ABNORMAL LOW (ref 1.5–6.5)
NEUT%: 44.3 % (ref 38.4–76.8)
Platelets: 168 10*3/uL (ref 145–400)
RBC: 4.12 10*6/uL (ref 3.70–5.45)
RDW: 14.7 % — ABNORMAL HIGH (ref 11.2–14.5)
WBC: 1.3 10*3/uL — ABNORMAL LOW (ref 3.9–10.3)
lymph#: 0.5 10*3/uL — ABNORMAL LOW (ref 0.9–3.3)

## 2015-10-08 LAB — COMPREHENSIVE METABOLIC PANEL (CC13)
ALT: 10 U/L (ref 0–55)
AST: 15 U/L (ref 5–34)
Albumin: 3.7 g/dL (ref 3.5–5.0)
Alkaline Phosphatase: 51 U/L (ref 40–150)
Anion Gap: 7 mEq/L (ref 3–11)
BUN: 9.8 mg/dL (ref 7.0–26.0)
CO2: 27 mEq/L (ref 22–29)
Calcium: 9.2 mg/dL (ref 8.4–10.4)
Chloride: 108 mEq/L (ref 98–109)
Creatinine: 0.7 mg/dL (ref 0.6–1.1)
EGFR: >90 mL/min/{1.73_m2} (ref >90)
Glucose: 87 mg/dl (ref 70–140)
Potassium: 4 mEq/L (ref 3.5–5.1)
Sodium: 142 mEq/L (ref 136–145)
Total Bilirubin: 0.61 mg/dL (ref 0.20–1.20)
Total Protein: 6.4 g/dL (ref 6.4–8.3)

## 2015-10-08 LAB — LACTATE DEHYDROGENASE (CC13): LDH: 142 U/L (ref 125–245)

## 2015-10-08 LAB — GLUCOSE, CAPILLARY: Glucose-Capillary: 67 mg/dL (ref 65–99)

## 2015-10-08 MED ORDER — FLUDEOXYGLUCOSE F - 18 (FDG) INJECTION
6.7800 | Freq: Once | INTRAVENOUS | Status: DC | PRN
  Administered 2015-10-08: 6.78 via INTRAVENOUS
  Filled 2015-10-08: qty 6.78

## 2015-10-08 MED ORDER — SODIUM CHLORIDE 0.9 % IJ SOLN
10.0000 mL | INTRAMUSCULAR | Status: DC | PRN
  Administered 2015-10-08: 10 mL via INTRAVENOUS
  Filled 2015-10-08: qty 10

## 2015-10-08 NOTE — Patient Instructions (Signed)

## 2015-10-09 ENCOUNTER — Telehealth: Payer: Self-pay | Admitting: Hematology and Oncology

## 2015-10-09 ENCOUNTER — Encounter: Payer: Self-pay | Admitting: *Deleted

## 2015-10-09 ENCOUNTER — Ambulatory Visit: Payer: Medicare Other | Admitting: Hematology and Oncology

## 2015-10-09 ENCOUNTER — Ambulatory Visit (HOSPITAL_BASED_OUTPATIENT_CLINIC_OR_DEPARTMENT_OTHER): Payer: Medicare Other | Admitting: Hematology and Oncology

## 2015-10-09 VITALS — BP 122/77 | HR 76 | Temp 97.7°F | Resp 18 | Ht 65.0 in | Wt 136.8 lb

## 2015-10-09 DIAGNOSIS — R5383 Other fatigue: Secondary | ICD-10-CM

## 2015-10-09 DIAGNOSIS — M7989 Other specified soft tissue disorders: Secondary | ICD-10-CM

## 2015-10-09 DIAGNOSIS — Z23 Encounter for immunization: Secondary | ICD-10-CM

## 2015-10-09 DIAGNOSIS — D701 Agranulocytosis secondary to cancer chemotherapy: Secondary | ICD-10-CM

## 2015-10-09 DIAGNOSIS — M799 Soft tissue disorder, unspecified: Secondary | ICD-10-CM

## 2015-10-09 DIAGNOSIS — C8331 Diffuse large B-cell lymphoma, lymph nodes of head, face, and neck: Secondary | ICD-10-CM

## 2015-10-09 DIAGNOSIS — D702 Other drug-induced agranulocytosis: Secondary | ICD-10-CM

## 2015-10-09 DIAGNOSIS — Z8572 Personal history of non-Hodgkin lymphomas: Secondary | ICD-10-CM

## 2015-10-09 MED ORDER — PNEUMOCOCCAL 13-VAL CONJ VACC IM SUSP
0.5000 mL | Freq: Once | INTRAMUSCULAR | Status: AC
  Administered 2015-10-09: 0.5 mL via INTRAMUSCULAR
  Filled 2015-10-09: qty 0.5

## 2015-10-09 MED ORDER — INFLUENZA VAC SPLIT QUAD 0.5 ML IM SUSY
0.5000 mL | PREFILLED_SYRINGE | Freq: Once | INTRAMUSCULAR | Status: AC
  Administered 2015-10-09: 0.5 mL via INTRAMUSCULAR
  Filled 2015-10-09: qty 0.5

## 2015-10-09 NOTE — Telephone Encounter (Signed)
Gave and printed appt sched and avs for pt for April 2017 °

## 2015-10-09 NOTE — Progress Notes (Signed)
  Oncology Nurse Navigator Documentation   Navigator Encounter Type: 3 month (10/09/15 1045)     Met with Tabitha Jackson after her est pt appt with Dr. Alvy Bimler. She was accompanied by her husband and niece. She happily reported that yesterday's PET scan indicated no residual/recurrent disease. She and husband reiterated their appreciation for recent H&N [facility] program. She denied any needs or concerns; I encouraged her to contact me if that changes before I see her next, she verbalized understanding.   Gayleen Orem, RN, BSN, [place] at [place] [phone]                     Time Spent with Patient: 30 (10/09/15 1045)

## 2015-10-10 ENCOUNTER — Encounter: Payer: Self-pay | Admitting: Hematology and Oncology

## 2015-10-10 DIAGNOSIS — M799 Soft tissue disorder, unspecified: Secondary | ICD-10-CM | POA: Insufficient documentation

## 2015-10-10 DIAGNOSIS — M7989 Other specified soft tissue disorders: Secondary | ICD-10-CM | POA: Insufficient documentation

## 2015-10-10 DIAGNOSIS — D702 Other drug-induced agranulocytosis: Secondary | ICD-10-CM | POA: Insufficient documentation

## 2015-10-10 NOTE — Assessment & Plan Note (Signed)
She has a soft tissue lesion in the left buttock region. According to the patient, she fell a long time ago. She denies recent pain or discomfort in that region. I recommend observation only and repeat imaging in a new future to ensure resolution

## 2015-10-10 NOTE — Progress Notes (Signed)
[place] OFFICE PROGRESS NOTE  Patient Care Team: Jani Gravel, MD as PCP - General (Internal Medicine) [name], MD as Consulting Physician (Hematology and Oncology) [name], MD as Consulting Physician (Otolaryngology) Leota Sauers, RN as Oncology Nurse Navigator Thea Silversmith, MD as Consulting Physician (Radiation Oncology)  SUMMARY OF ONCOLOGIC HISTORY:   History of B-cell lymphoma   01/31/2015 Imaging CT scan of the neck showed large mass centered around the right parapharyngeal space, measuring approximately 3.6 cm with bulky lymphadenopathy on the right side. Incidentally 2 nodules were found.   02/05/2015 Pathology Results Accession: [id number] biopsy confirmed diffuse large B-cell lymphoma   02/05/2015 Surgery She had excisional lymph node biopsy of the right neck mass   02/16/2015 Imaging  echocardiogram showed preserved ejection fraction.   02/19/2015 Bone Marrow Biopsy Accession: [id number] BM biopsy was negative for cancer   02/21/2015 Imaging  she had PET CT scan which showed stage II disease.   02/26/2015 Procedure  she had placement of Infuse-a-Port.   03/01/2015 - 04/20/2015 Chemotherapy  she received 3 cycles of R-CHOP chemotherapy with 2 cycles of IT methotrexate   03/22/2015 Adverse Reaction Cycle 2 of RCHOP chemotherapy is delayed due to neutropenia    05/10/2015 Imaging PET scan showed positive response to treatment   06/11/2015 - 07/09/2015 Radiation Therapy Helical IMRT Tomotherapy to R neck:  40 Gy at 2 Gy per fraction x 20 fractions.   10/08/2015 Imaging PET scan showed no evidence of disease recurrence    INTERVAL HISTORY: Please see below for problem oriented charting. She returns for further follow-up. She is doing well. She complained of some persistent dry mouth but overall stable. She denies recent infection. No new lymphadenopathy.  REVIEW OF SYSTEMS:   Constitutional: Denies fevers, chills or abnormal weight loss Eyes: Denies blurriness of  vision Ears, nose, mouth, throat, and face: Denies mucositis or sore throat Respiratory: Denies cough, dyspnea or wheezes Cardiovascular: Denies palpitation, chest discomfort or lower extremity swelling Gastrointestinal:  Denies nausea, heartburn or change in bowel habits Skin: Denies abnormal skin rashes Lymphatics: Denies new lymphadenopathy or easy bruising Neurological:Denies numbness, tingling or new weaknesses Behavioral/Psych: Mood is stable, no new changes  All other systems were reviewed with the patient and are negative.  I have reviewed the past medical history, past surgical history, social history and family history with the patient and they are unchanged from previous note.  ALLERGIES:  is allergic to erythromycin; hydrocodone; peanut-containing drug products; and biaxin.  MEDICATIONS:  Current Outpatient Prescriptions  Medication Sig Dispense Refill  . emollient (BIAFINE) cream Apply 90 g topically 2 (two) times daily.    . fluconazole (DIFLUCAN) 100 MG tablet Take 1 tablet (100 mg total) by mouth daily. (Patient not taking: Reported on 08/16/2015) 5 tablet 0  . lidocaine (XYLOCAINE) 2 % solution Use as directed 10 mLs in the mouth or throat every 6 (six) hours as needed for mouth pain. (Patient not taking: Reported on 08/16/2015) 100 mL 0   No current facility-administered medications for this visit.   Facility-Administered Medications Ordered in Other Visits  Medication Dose Route Frequency Provider Last Rate Last Dose  . fludeoxyglucose F - 18 (FDG) injection 6.78 milli Curie  6.78 milli Curie Intravenous Once PRN Medication Radiologist, MD   6.78 milli Curie at 10/08/15 1013    PHYSICAL EXAMINATION: ECOG PERFORMANCE STATUS: 0 - Asymptomatic  Filed Vitals:   10/09/15 1011  BP: 122/77  Pulse: 76  Temp: 97.7 F (36.5 C)  Resp: 18   Filed Weights   10/09/15 1011  Weight: 136 lb 12.8 oz (62.052 kg)    GENERAL:alert, no distress and comfortable SKIN: skin  color, texture, turgor are normal, no rashes or significant lesions EYES: normal, Conjunctiva are pink and non-injected, sclera clear OROPHARYNX:no exudate, no erythema and lips, buccal mucosa, and tongue normal  NECK: supple, thyroid normal size, non-tender, without nodularity LYMPH:  no palpable lymphadenopathy in the cervical, axillary or inguinal LUNGS: clear to auscultation and percussion with normal breathing effort HEART: regular rate & rhythm and no murmurs and no lower extremity edema ABDOMEN:abdomen soft, non-tender and normal bowel sounds Musculoskeletal:no cyanosis of digits and no clubbing  NEURO: alert & oriented x 3 with fluent speech, no focal motor/sensory deficits  LABORATORY DATA:  I have reviewed the data as listed    Component Value Date/Time   NA 142 10/08/2015 0852   NA 137 02/26/2015 1330   K 4.0 10/08/2015 0852   K 3.7 02/26/2015 1330   CL 102 02/26/2015 1330   CO2 27 10/08/2015 0852   CO2 29 02/26/2015 1330   GLUCOSE 87 10/08/2015 0852   GLUCOSE 79 02/26/2015 1330   BUN 9.8 10/08/2015 0852   BUN 9 02/26/2015 1330   CREATININE 0.7 10/08/2015 0852   CREATININE 0.72 02/26/2015 1330   CREATININE 0.65 01/08/2015 0946   CALCIUM 9.2 10/08/2015 0852   CALCIUM 9.0 02/26/2015 1330   PROT 6.4 10/08/2015 0852   PROT 6.9 02/19/2015 0720   ALBUMIN 3.7 10/08/2015 0852   ALBUMIN 3.9 02/19/2015 0720   AST 15 10/08/2015 0852   AST 24 02/19/2015 0720   ALT 10 10/08/2015 0852   ALT 19 02/19/2015 0720   ALKPHOS 51 10/08/2015 0852   ALKPHOS 55 02/19/2015 0720   BILITOT 0.61 10/08/2015 0852   BILITOT 0.5 02/19/2015 0720   GFRNONAA 87* 02/26/2015 1330   GFRAA >90 02/26/2015 1330    No results found for: SPEP, UPEP  Lab Results  Component Value Date   WBC 1.3* 10/08/2015   NEUTROABS 0.6* 10/08/2015   HGB 11.8 10/08/2015   HCT 35.5 10/08/2015   MCV 86.2 10/08/2015   PLT 168 10/08/2015      Chemistry      Component Value Date/Time   NA 142 10/08/2015 0852    NA 137 02/26/2015 1330   K 4.0 10/08/2015 0852   K 3.7 02/26/2015 1330   CL 102 02/26/2015 1330   CO2 27 10/08/2015 0852   CO2 29 02/26/2015 1330   BUN 9.8 10/08/2015 0852   BUN 9 02/26/2015 1330   CREATININE 0.7 10/08/2015 0852   CREATININE 0.72 02/26/2015 1330   CREATININE 0.65 01/08/2015 0946      Component Value Date/Time   CALCIUM 9.2 10/08/2015 0852   CALCIUM 9.0 02/26/2015 1330   ALKPHOS 51 10/08/2015 0852   ALKPHOS 55 02/19/2015 0720   AST 15 10/08/2015 0852   AST 24 02/19/2015 0720   ALT 10 10/08/2015 0852   ALT 19 02/19/2015 0720   BILITOT 0.61 10/08/2015 0852   BILITOT 0.5 02/19/2015 0720       RADIOGRAPHIC STUDIES:I reviewed the PET scan with the patient I have personally reviewed the radiological images as listed and agreed with the findings in the report.   ASSESSMENT & PLAN:  History of B-cell lymphoma I reviewed the imaging study with her and her family. The patient remained in complete remission. There are a few abnormal changes noted on the scan but they appear to  be benign. I recommend repeat scan again to ensure resolution of the abnormalities in the next visit. I will try to get port removed in the near future  Drug-induced neutropenia ([place]) She has persistent drug-induced neutropenia. She is not symptomatic. She does not need treatment. I recommend neutropenic precaution.  Soft tissue lesion of pelvic region She has a soft tissue lesion in the left buttock region. According to the patient, she fell a long time ago. She denies recent pain or discomfort in that region. I recommend observation only and repeat imaging in a new future to ensure resolution   Orders Placed This Encounter  Procedures  . IR Removal Tun Access W/ Port W/O FL    Standing Status: Future     Number of Occurrences:      Standing Expiration Date: 12/08/2016    Order Specific Question:  Reason for exam:    Answer:  remove port    Order Specific Question:  Preferred  Imaging Location?    Answer:  [facility] ([facility]) Skull Base To Thigh    Standing Status: Future     Number of Occurrences:      Standing Expiration Date: 12/08/2016    Order Specific Question:  Reason for Exam (SYMPTOM  OR DIAGNOSIS REQUIRED)    Answer:  lymphoma staging, abnromal lung nodules, lesion left hip region    Order Specific Question:  Preferred imaging location?    Answer:  [facility]  . Comprehensive metabolic panel    Standing Status: Future     Number of Occurrences:      Standing Expiration Date: 12/08/2016  . CBC with Differential/Platelet    Standing Status: Future     Number of Occurrences:      Standing Expiration Date: 12/08/2016  . Lactate dehydrogenase    Standing Status: Future     Number of Occurrences:      Standing Expiration Date: 12/08/2016  . TSH    Standing Status: Future     Number of Occurrences:      Standing Expiration Date: 12/08/2016   All questions were answered. The patient knows to call the clinic with any problems, questions or concerns. No barriers to learning was detected. I spent 25 minutes counseling the patient face to face. The total time spent in the appointment was 30 minutes and more than 50% was on counseling and review of test results     [facility], Mariem Skolnick, MD 10/10/2015 1:12 PM

## 2015-10-10 NOTE — Assessment & Plan Note (Signed)
She has persistent drug-induced neutropenia. She is not symptomatic. She does not need treatment. I recommend neutropenic precaution.

## 2015-10-10 NOTE — Assessment & Plan Note (Signed)
I reviewed the imaging study with her and her family. The patient remained in complete remission. There are a few abnormal changes noted on the scan but they appear to be benign. I recommend repeat scan again to ensure resolution of the abnormalities in the next visit. I will try to get port removed in the near future

## 2015-10-16 ENCOUNTER — Other Ambulatory Visit: Payer: Self-pay | Admitting: Radiology

## 2015-10-18 ENCOUNTER — Ambulatory Visit (HOSPITAL_COMMUNITY)
Admission: RE | Admit: 2015-10-18 | Discharge: 2015-10-18 | Disposition: A | Discharge status: Home / Self Care | Payer: Medicare Other | Source: Ambulatory Visit | Attending: Hematology and Oncology | Admitting: Hematology and Oncology

## 2015-10-18 ENCOUNTER — Other Ambulatory Visit: Payer: Self-pay | Admitting: Radiology

## 2015-10-18 ENCOUNTER — Encounter (HOSPITAL_COMMUNITY): Payer: Self-pay

## 2015-10-18 DIAGNOSIS — Z923 Personal history of irradiation: Secondary | ICD-10-CM | POA: Insufficient documentation

## 2015-10-18 DIAGNOSIS — Z5111 Encounter for antineoplastic chemotherapy: Secondary | ICD-10-CM | POA: Diagnosis not present

## 2015-10-18 DIAGNOSIS — Z452 Encounter for adjustment and management of vascular access device: Secondary | ICD-10-CM | POA: Insufficient documentation

## 2015-10-18 DIAGNOSIS — C8510 Unspecified B-cell lymphoma, unspecified site: Secondary | ICD-10-CM | POA: Insufficient documentation

## 2015-10-18 DIAGNOSIS — Z9221 Personal history of antineoplastic chemotherapy: Secondary | ICD-10-CM | POA: Diagnosis not present

## 2015-10-18 DIAGNOSIS — C8331 Diffuse large B-cell lymphoma, lymph nodes of head, face, and neck: Secondary | ICD-10-CM

## 2015-10-18 LAB — PROTIME-INR
INR: 1.06 (ref 0.00–1.49)
Prothrombin Time: 14 seconds (ref 11.6–15.2)

## 2015-10-18 LAB — CBC
HCT: 33.4 % — ABNORMAL LOW (ref 36.0–46.0)
Hemoglobin: 10.9 g/dL — ABNORMAL LOW (ref 12.0–15.0)
MCH: 28.6 pg (ref 26.0–34.0)
MCHC: 32.6 g/dL (ref 30.0–36.0)
MCV: 87.7 fL (ref 78.0–100.0)
Platelets: 165 10*3/uL (ref 150–400)
RBC: 3.81 MIL/uL — ABNORMAL LOW (ref 3.87–5.11)
RDW: 15 % (ref 11.5–15.5)
WBC: 1.4 10*3/uL — CL (ref 4.0–10.5)

## 2015-10-18 LAB — BASIC METABOLIC PANEL
Anion gap: 8 (ref 5–15)
BUN: 11 mg/dL (ref 6–20)
CO2: 26 mmol/L (ref 22–32)
Calcium: 9 mg/dL (ref 8.9–10.3)
Chloride: 104 mmol/L (ref 101–111)
Creatinine, Ser: 0.79 mg/dL (ref 0.44–1.00)
GFR calc Af Amer: >60 mL/min (ref >60)
GFR calc non Af Amer: >60 mL/min (ref >60)
Glucose, Bld: 88 mg/dL (ref 65–99)
Potassium: 3.8 mmol/L (ref 3.5–5.1)
Sodium: 138 mmol/L (ref 135–145)

## 2015-10-18 LAB — APTT: aPTT: 29 seconds (ref 24–37)

## 2015-10-18 MED ORDER — MIDAZOLAM HCL 2 MG/2ML IJ SOLN
INTRAMUSCULAR | Status: AC
  Filled 2015-10-18: qty 4

## 2015-10-18 MED ORDER — MIDAZOLAM HCL 2 MG/2ML IJ SOLN
INTRAMUSCULAR | Status: AC | PRN
  Administered 2015-10-18: 1 mg via INTRAVENOUS
  Administered 2015-10-18: 0.5 mg via INTRAVENOUS

## 2015-10-18 MED ORDER — CEFAZOLIN SODIUM-DEXTROSE 2-3 GM-% IV SOLR
INTRAVENOUS | Status: AC
  Filled 2015-10-18: qty 50

## 2015-10-18 MED ORDER — CEFAZOLIN SODIUM-DEXTROSE 2-3 GM-% IV SOLR
2.0000 g | INTRAVENOUS | Status: AC
  Administered 2015-10-18: 2 g via INTRAVENOUS

## 2015-10-18 MED ORDER — FENTANYL CITRATE (PF) 100 MCG/2ML IJ SOLN
INTRAMUSCULAR | Status: AC | PRN
  Administered 2015-10-18 (×2): 25 ug via INTRAVENOUS

## 2015-10-18 MED ORDER — LIDOCAINE-EPINEPHRINE 2 %-1:100000 IJ SOLN
INTRAMUSCULAR | Status: AC
  Filled 2015-10-18: qty 1

## 2015-10-18 MED ORDER — FENTANYL CITRATE (PF) 100 MCG/2ML IJ SOLN
INTRAMUSCULAR | Status: AC
  Filled 2015-10-18: qty 2

## 2015-10-18 MED ORDER — SODIUM CHLORIDE 0.9 % IV SOLN
Freq: Once | INTRAVENOUS | Status: AC
  Administered 2015-10-18: 08:00:00 via INTRAVENOUS

## 2015-10-18 MED ORDER — LIDOCAINE HCL 1 % IJ SOLN
INTRAMUSCULAR | Status: AC
  Filled 2015-10-18: qty 20

## 2015-10-18 NOTE — Procedures (Signed)
Successful RT IJ POWER PORT REMOVAL No comp Stable Full report in PACS

## 2015-10-18 NOTE — Discharge Instructions (Signed)
Incision Care °An incision is when a surgeon cuts into your body. After surgery, the incision needs to be cared for properly to prevent infection.  °HOW TO CARE FOR YOUR INCISION °· Take medicines only as directed by your health care provider. °· There are many different ways to close and cover an incision, including stitches, skin glue, and adhesive strips. Follow your health care provider's instructions on: °¨ Incision care. °¨ Bandage (dressing) changes and removal. °¨ Incision closure removal. °· Do not take baths, swim, or use a hot tub until your health care provider approves. You may shower as directed by your health care provider. °· Resume your normal diet and activities as directed. °· Use anti-itch medicine (such as an antihistamine) as directed by your health care provider. The incision may itch while it is healing. Do not pick or scratch at the incision. °· Drink enough fluid to keep your urine clear or pale yellow. °SEEK MEDICAL CARE IF:  °· You have drainage, redness, swelling, or pain at your incision site. °· You have muscle aches, chills, or a general ill feeling. °· You notice a bad smell coming from the incision or dressing. °· Your incision edges separate after the sutures, staples, or skin adhesive strips have been removed. °· You have persistent nausea or vomiting. °· You have a fever. °· You are dizzy. °SEEK IMMEDIATE MEDICAL CARE IF:  °· You have a rash. °· You faint. °· You have difficulty breathing. °MAKE SURE YOU:  °· Understand these instructions. °· Will watch your condition. °· Will get help right away if you are not doing well or get worse. °  °This information is not intended to replace advice given to you by your health care provider. Make sure you discuss any questions you have with your health care provider. °  °Document Released: 06/27/2005 Document Revised: 12/29/2014 Document Reviewed: 02/01/2014 °Elsevier Interactive Patient Education ©2016 Elsevier Inc[employer] ° ° °Moderate  Conscious Sedation, Adult, Care After °Refer to this sheet in the next few weeks. These instructions provide you with information on caring for yourself after your procedure. Your health care provider may also give you more specific instructions. Your treatment has been planned according to current medical practices, but problems sometimes occur. Call your health care provider if you have any problems or questions after your procedure. °WHAT TO EXPECT AFTER THE PROCEDURE  °After your procedure: °· You may feel sleepy, clumsy, and have poor balance for several hours. °· Vomiting may occur if you eat too soon after the procedure. °HOME CARE INSTRUCTIONS °· Do not participate in any activities where you could become injured for at least 24 hours. Do not: °¨ Drive. °¨ Swim. °¨ Ride a bicycle. °¨ Operate heavy machinery. °¨ Cook. °¨ Use power tools. °¨ Climb ladders. °¨ Work from a high place. °· Do not make important decisions or sign legal documents until you are improved. °· If you vomit, drink water, juice, or soup when you can drink without vomiting. Make sure you have little or no nausea before eating solid foods. °· Only take over-the-counter or prescription medicines for pain, discomfort, or fever as directed by your health care provider. °· Make sure you and your family fully understand everything about the medicines given to you, including what side effects may occur. °· You should not drink alcohol, take sleeping pills, or take medicines that cause drowsiness for at least 24 hours. °· If you smoke, do not smoke without supervision. °· If you are feeling   better, you may resume normal activities 24 hours after you were sedated. °· Keep all appointments with your health care provider. °SEEK MEDICAL CARE IF: °· Your skin is pale or bluish in color. °· You continue to feel nauseous or vomit. °· Your pain is getting worse and is not helped by medicine. °· You have bleeding or swelling. °· You are still sleepy or  feeling clumsy after 24 hours. °SEEK IMMEDIATE MEDICAL CARE IF: °· You develop a rash. °· You have difficulty breathing. °· You develop any type of allergic problem. °· You have a fever. °MAKE SURE YOU: °· Understand these instructions. °· Will watch your condition. °· Will get help right away if you are not doing well or get worse. °  °This information is not intended to replace advice given to you by your health care provider. Make sure you discuss any questions you have with your health care provider. °  °Document Released: 09/28/2013 Document Revised: 12/29/2014 Document Reviewed: 09/28/2013 °Elsevier Interactive Patient Education ©2016 Elsevier Inc. ° °

## 2015-10-18 NOTE — H&P (Signed)
HPI: Patient with history of B cell lymphoma finished chemotherapy and radiation. She has been seen by Dr. Alvy Bimler with repeat PET done on 10/10/2015. IR received equest for right IJ port a catheter removal that is no longer needed.  The patient has had a H&P performed within the last 30 days, all history, medications, and exam have been reviewed. The patient denies any interval changes since the H&P.  Medications: Prior to Admission medications   Medication Sig Start Date End Date Taking? Authorizing Provider  emollient (BIAFINE) cream Apply 1 application topically 3 (three) times daily as needed (apply to right neck when has radiation).    Yes Historical Provider, MD  hydroxypropyl methylcellulose / hypromellose (ISOPTO TEARS / GONIOVISC) 2.5 % ophthalmic solution Place 2-3 drops into both eyes 3 (three) times daily as needed for dry eyes.   Yes Historical Provider, MD     Vital Signs: BP 122/67 mmHg  Pulse 50  Temp(Src) 98.2 F (36.8 C) (Oral)  Resp 16  Ht 5\' 5"  (1.651 m)  Wt 135 lb (61.236 kg)  BMI 22.47 kg/m2  SpO2 100%  Physical Exam  Constitutional: She is oriented to person, place, and time. No distress.  HENT:  Head: Normocephalic and atraumatic.  Cardiovascular: Normal rate and regular rhythm.  Exam reveals no gallop and no friction rub.   No murmur heard. Pulmonary/Chest: Effort normal and breath sounds normal. No respiratory distress. She has no wheezes. She has no rales.  Neurological: She is alert and oriented to person, place, and time.  Skin: Skin is warm and dry. She is not diaphoretic.  Right anterior chest wall port intact well healed    Mallampati Score:  MD Evaluation Airway: WNL Heart: WNL Abdomen: WNL Chest/ Lungs: WNL ASA  Classification: 2 Mallampati/Airway Score: Two  Labs:  CBC:  Recent Labs  04/19/15 0821 05/10/15 0813 06/22/15 0810 10/08/15 0853  WBC 1.7* 1.9* 1.5* 1.3*  HGB 10.9* 11.3* 11.0* 11.8  HCT 33.8* 33.5* 32.7* 35.5    PLT 235 215 167 168    COAGS:  Recent Labs  02/26/15 1330  INR 0.94  APTT 32    BMP:  Recent Labs  01/08/15 0946  02/19/15 0720 02/26/15 1330  04/19/15 0804 05/10/15 0813 06/22/15 0811 10/08/15 0852  NA 139  --  140 137  < > 143 143 141 142  K 4.9  --  4.7 3.7  < > 3.9 3.9 3.8 4.0  CL 101  --  106 102  --   --   --   --   --   CO2 32  --  26 29  < > 25 25 26 27   GLUCOSE 78  --  82 79  < > 87 89 91 87  BUN 9  --  15 9  < > 12.3 17.0 12.2 9.8  CALCIUM 9.6  --  9.1 9.0  < > 9.2 9.1 9.1 9.2  CREATININE 0.65  < > 0.62 0.72  < > 0.7 0.7 0.8 0.7  GFRNONAA  --   --  >90 87*  --   --   --   --   --   GFRAA  --   --  >90 >90  --   --   --   --   --   < > = values in this interval not displayed.  LIVER FUNCTION TESTS:  Recent Labs  04/19/15 0804 05/10/15 0813 06/22/15 0811 10/08/15 0852  BILITOT 0.31 0.40 0.49 0.61  AST 17 18 19 15   ALT 15 19 17 10   ALKPHOS 67 69 68 51  PROT 6.3* 6.4 6.2* 6.4  ALBUMIN 3.8 3.7 3.7 3.7    Assessment/Plan:  History of B cell lymphoma finished chemotherapy and radiation Seen by Dr. Alvy Bimler with repeat PET done 10/10/2015 Request for right IJ port a catheter removal that is no longer needed, placed 02/2015 by IR- no interval problems with port The patient has been NPO, no blood thinners taken, labs and vitals have been reviewed. Risks and Benefits discussed with the patient including, but not limited to bleeding, infection, or pneumothorax Consent signed and in chart.    SignedHedy Jacob 10/18/2015, 8:28 AM

## 2015-10-18 NOTE — Progress Notes (Signed)
CRITICAL VALUE ALERT  Critical value received:  WBC 1.4  Date of notification:  10/18/15  Time of notification:  0830  Critical value read back:Yes.    Nurse who received alert:  Mardelle Matte RN  MD notified (1st page):  Tsosie Billing, [place] (who called Dr. Jiles Harold)  Time of first page:  0840  MD notified (2nd page):  Time of second page:  Responding MD:  Tsosie Billing, PA  Time MD responded:  [phone]

## 2015-10-26 IMAGING — CT NM PET TUM IMG RESTAG (PS) SKULL BASE T - THIGH
1 of 7 series · 1 of 25 positions shown · non-contrast
Comparison: 02/21/2015

CLINICAL DATA: Subsequent treatment strategy for lymphoma.

EXAM:
NUCLEAR MEDICINE PET SKULL BASE TO THIGH
TECHNIQUE: 6.8 MCi F-18 FDG was injected intravenously. Full-ring PET imaging
was performed from the skull base to thigh after the radiotracer. CT
data was obtained and used for attenuation correction and anatomic
localization.
FASTING BLOOD GLUCOSE:  Value: 63 mg/dl

[Series 4: ct sk_thigh 5.0 b31f · axial · 5.0mm · 0.98mm/px · 1 of 199 slices shown]
[im 199/199  brain]
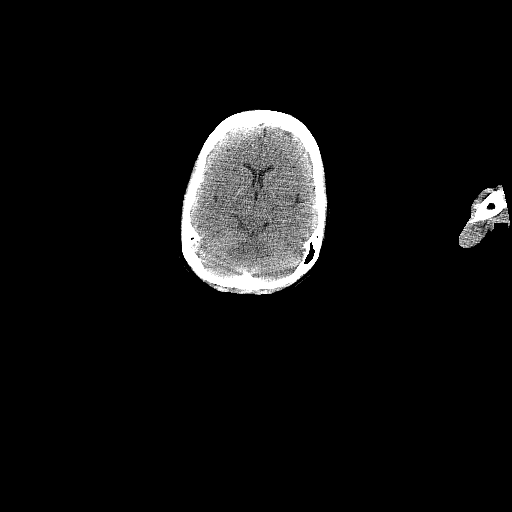

[1 of 25 positions shown; findings below may reference images not displayed]

FINDINGS: NECK

Significant interval reduction in bulky tumor within the right
parapharyngeal space. Residual hypermetabolic level 2 lymph node has
an SUV max equal to 3.5. On the previous exam the SUV max was equal
to 8.9.

CHEST

No hypermetabolic mediastinal or hilar nodes. Nonspecific FDG uptake
within the right hilar region is again noted. This has an SUV max
equal to 3.4. Previous this measured the same. No hypermetabolic
supraclavicular or axillary adenopathy.

No pleural effusions noted. Stable ground-glass 7 mm nodule in the
superior segment of right lower low without significant FDG uptake.
There are 2 ground-glass attenuating nodules right upper lobe which
measure up to 7 mm, image 28/series a. These are also unchanged from
previous exam. No suspicious pulmonary nodules on the CT scan.

ABDOMEN/PELVIS

No abnormal hypermetabolic activity within the liver, pancreas,
adrenal glands, or spleen. No hypermetabolic lymph nodes in the
abdomen or pelvis.

SKELETON

No focal hypermetabolic activity to suggest skeletal metastasis.
IMPRESSION: 1. Interval response to therapy. There has been significant
reduction and FDG uptake and bulky tumor within the right
parapharyngeal space. Residual level-II lymph node exhibits low
level nonspecific FDG uptake with an SUV max equal to 3.5.
2. Persistence of ground-glass attenuating nodules in the right
lung. These will need annual surveillance for minimum of 3 years to
ensure stability. Initial follow-up by chest CT without contrast is
recommended in 3 months to confirm persistence. This recommendation
follows the consensus statement: Recommendations for the Management
of Subsolid Pulmonary Nodules Detected at CT: A Statement from the

## 2015-11-19 ENCOUNTER — Telehealth: Payer: Self-pay | Admitting: Hematology and Oncology

## 2015-11-19 ENCOUNTER — Telehealth: Payer: Self-pay | Admitting: *Deleted

## 2015-11-19 ENCOUNTER — Other Ambulatory Visit: Payer: Self-pay | Admitting: Hematology and Oncology

## 2015-11-19 DIAGNOSIS — Z8572 Personal history of non-Hodgkin lymphomas: Secondary | ICD-10-CM

## 2015-11-19 NOTE — Telephone Encounter (Signed)
I will place POF to see her tomorrow afternoon Stop alka seltzer, just take tylenol for now [place] may contain aspirin products and if she takes that much may increase risk of bleeding I will order labs before I see her tomorrow

## 2015-11-19 NOTE — Telephone Encounter (Signed)
Pt notified to stop alka seltzer, take tylenol if needed

## 2015-11-19 NOTE — Telephone Encounter (Signed)
s.w. pt and advised on 11.29 appt.Marland KitchenMarland KitchenMarland KitchenMarland Kitchenpt ok and aware

## 2015-11-19 NOTE — Telephone Encounter (Signed)
Received call from pt stating that her port was removed in Oct. (she thinks) & it is still sore.  It was removed by radiology & is tender touch.  She reports that it is slightly red & slightly warm to touch.  She does recall that her WBC count was low last visit. She denies any fever.  She also has a cold & is taking 2 alka-seltzer plus q 4 hr.  She states she still has thick mucous from her throat cancer but nasal drainage is clear. She can be reached at home # [phone]. Message to Dr Alvy Bimler & RN

## 2015-11-20 ENCOUNTER — Encounter: Payer: Self-pay | Admitting: Hematology and Oncology

## 2015-11-20 ENCOUNTER — Ambulatory Visit (HOSPITAL_BASED_OUTPATIENT_CLINIC_OR_DEPARTMENT_OTHER): Payer: Medicare Other | Admitting: Hematology and Oncology

## 2015-11-20 ENCOUNTER — Other Ambulatory Visit (HOSPITAL_BASED_OUTPATIENT_CLINIC_OR_DEPARTMENT_OTHER): Payer: Medicare Other

## 2015-11-20 VITALS — BP 122/69 | HR 64 | Temp 98.1°F | Resp 18 | Ht 65.0 in | Wt 138.5 lb

## 2015-11-20 DIAGNOSIS — Z8572 Personal history of non-Hodgkin lymphomas: Secondary | ICD-10-CM

## 2015-11-20 DIAGNOSIS — L989 Disorder of the skin and subcutaneous tissue, unspecified: Secondary | ICD-10-CM | POA: Diagnosis not present

## 2015-11-20 DIAGNOSIS — C8331 Diffuse large B-cell lymphoma, lymph nodes of head, face, and neck: Secondary | ICD-10-CM

## 2015-11-20 DIAGNOSIS — D701 Agranulocytosis secondary to cancer chemotherapy: Secondary | ICD-10-CM | POA: Diagnosis not present

## 2015-11-20 DIAGNOSIS — D702 Other drug-induced agranulocytosis: Secondary | ICD-10-CM

## 2015-11-20 DIAGNOSIS — D630 Anemia in neoplastic disease: Secondary | ICD-10-CM | POA: Diagnosis not present

## 2015-11-20 LAB — CBC WITH DIFFERENTIAL/PLATELET
BASO%: 0.3 % (ref 0.0–2.0)
Basophils Absolute: 0 10*3/uL (ref 0.0–0.1)
EOS%: 3.1 % (ref 0.0–7.0)
Eosinophils Absolute: 0.1 10*3/uL (ref 0.0–0.5)
HCT: 34.7 % — ABNORMAL LOW (ref 34.8–46.6)
HGB: 11.5 g/dL — ABNORMAL LOW (ref 11.6–15.9)
LYMPH%: 23.7 % (ref 14.0–49.7)
MCH: 29.1 pg (ref 25.1–34.0)
MCHC: 33.1 g/dL (ref 31.5–36.0)
MCV: 87.8 fL (ref 79.5–101.0)
MONO#: 0.5 10*3/uL (ref 0.1–0.9)
MONO%: 16.6 % — ABNORMAL HIGH (ref 0.0–14.0)
NEUT#: 1.8 10*3/uL (ref 1.5–6.5)
NEUT%: 56.3 % (ref 38.4–76.8)
Platelets: 182 10*3/uL (ref 145–400)
RBC: 3.95 10*6/uL (ref 3.70–5.45)
RDW: 14.2 % (ref 11.2–14.5)
WBC: 3.3 10*3/uL — ABNORMAL LOW (ref 3.9–10.3)
lymph#: 0.8 10*3/uL — ABNORMAL LOW (ref 0.9–3.3)

## 2015-11-20 NOTE — Assessment & Plan Note (Signed)
White count is improving nicely. I recommend observation only.

## 2015-11-20 NOTE — Assessment & Plan Note (Signed)
She has a scab over previous port excision site. Clinically, there is no signs of infection. I reassured the patient

## 2015-11-20 NOTE — Assessment & Plan Note (Signed)
The patient remained in complete remission. I will see her next year as scheduled.

## 2015-11-20 NOTE — Assessment & Plan Note (Signed)
This is likely due to recent treatment. The patient denies recent history of bleeding such as epistaxis, hematuria or hematochezia. She is asymptomatic from the anemia. I will observe for now.   

## 2015-11-20 NOTE — Progress Notes (Signed)
[place] OFFICE PROGRESS NOTE  Patient Care Team: Jani Gravel, MD as PCP - General (Internal Medicine) [name], MD as Consulting Physician (Hematology and Oncology) [name], MD as Consulting Physician (Otolaryngology) Leota Sauers, RN as Oncology Nurse Navigator Thea Silversmith, MD as Consulting Physician (Radiation Oncology)  SUMMARY OF ONCOLOGIC HISTORY:   History of B-cell lymphoma   01/31/2015 Imaging CT scan of the neck showed large mass centered around the right parapharyngeal space, measuring approximately 3.6 cm with bulky lymphadenopathy on the right side. Incidentally 2 nodules were found.   02/05/2015 Pathology Results Accession: [id number] biopsy confirmed diffuse large B-cell lymphoma   02/05/2015 Surgery She had excisional lymph node biopsy of the right neck mass   02/16/2015 Imaging  echocardiogram showed preserved ejection fraction.   02/19/2015 Bone Marrow Biopsy Accession: [id number] BM biopsy was negative for cancer   02/21/2015 Imaging  she had PET CT scan which showed stage II disease.   02/26/2015 Procedure  she had placement of Infuse-a-Port.   03/01/2015 - 04/20/2015 Chemotherapy  she received 3 cycles of R-CHOP chemotherapy with 2 cycles of IT methotrexate   03/22/2015 Adverse Reaction Cycle 2 of RCHOP chemotherapy is delayed due to neutropenia    05/10/2015 Imaging PET scan showed positive response to treatment   06/11/2015 - 07/09/2015 Radiation Therapy Helical IMRT Tomotherapy to R neck:  40 Gy at 2 Gy per fraction x 20 fractions.   10/08/2015 Imaging PET scan showed no evidence of disease recurrence   10/18/2015 Procedure Port-a-cath removed.    INTERVAL HISTORY: Please see below for problem oriented charting. She feels well apart from recent cold-like illness. She noted a small abnormal change over recent port excision site and is concerned it might be infected. She denies fevers or chills.  REVIEW OF SYSTEMS:   Constitutional: Denies fevers,  chills or abnormal weight loss Eyes: Denies blurriness of vision Ears, nose, mouth, throat, and face: Denies mucositis or sore throat Respiratory: Denies cough, dyspnea or wheezes Cardiovascular: Denies palpitation, chest discomfort or lower extremity swelling Gastrointestinal:  Denies nausea, heartburn or change in bowel habits Lymphatics: Denies new lymphadenopathy or easy bruising Neurological:Denies numbness, tingling or new weaknesses Behavioral/Psych: Mood is stable, no new changes  All other systems were reviewed with the patient and are negative.  I have reviewed the past medical history, past surgical history, social history and family history with the patient and they are unchanged from previous note.  ALLERGIES:  is allergic to erythromycin; hydrocodone; peanut-containing drug products; and biaxin.  MEDICATIONS:  Current Outpatient Prescriptions  Medication Sig Dispense Refill  . emollient (BIAFINE) cream Apply 1 application topically 3 (three) times daily as needed (apply to right neck when has radiation).     . hydroxypropyl methylcellulose / hypromellose (ISOPTO TEARS / GONIOVISC) 2.5 % ophthalmic solution Place 2-3 drops into both eyes 3 (three) times daily as needed for dry eyes.     No current facility-administered medications for this visit.    PHYSICAL EXAMINATION: ECOG PERFORMANCE STATUS: 0 - Asymptomatic  Filed Vitals:   11/20/15 1436  BP: 122/69  Pulse: 64  Temp: 98.1 F (36.7 C)  Resp: 18   Filed Weights   11/20/15 1436  Weight: 138 lb 8 oz (62.823 kg)    GENERAL:alert, no distress and comfortable SKIN: there is a clean scab over previous port site. Previous old bandages is still on it. There is no signs of infection or cellulitis. EYES: normal, Conjunctiva are pink and non-injected, sclera clear  Musculoskeletal:no cyanosis of digits and no clubbing  NEURO: alert & oriented x 3 with fluent speech, no focal motor/sensory deficits  LABORATORY DATA:   I have reviewed the data as listed    Component Value Date/Time   NA 138 10/18/2015 0805   NA 142 10/08/2015 0852   K 3.8 10/18/2015 0805   K 4.0 10/08/2015 0852   CL 104 10/18/2015 0805   CO2 26 10/18/2015 0805   CO2 27 10/08/2015 0852   GLUCOSE 88 10/18/2015 0805   GLUCOSE 87 10/08/2015 0852   BUN 11 10/18/2015 0805   BUN 9.8 10/08/2015 0852   CREATININE 0.79 10/18/2015 0805   CREATININE 0.7 10/08/2015 0852   CREATININE 0.65 01/08/2015 0946   CALCIUM 9.0 10/18/2015 0805   CALCIUM 9.2 10/08/2015 0852   PROT 6.4 10/08/2015 0852   PROT 6.9 02/19/2015 0720   ALBUMIN 3.7 10/08/2015 0852   ALBUMIN 3.9 02/19/2015 0720   AST 15 10/08/2015 0852   AST 24 02/19/2015 0720   ALT 10 10/08/2015 0852   ALT 19 02/19/2015 0720   ALKPHOS 51 10/08/2015 0852   ALKPHOS 55 02/19/2015 0720   BILITOT 0.61 10/08/2015 0852   BILITOT 0.5 02/19/2015 0720   GFRNONAA >60 10/18/2015 0805   GFRAA >60 10/18/2015 0805    No results found for: SPEP, UPEP  Lab Results  Component Value Date   WBC 3.3* 11/20/2015   NEUTROABS 1.8 11/20/2015   HGB 11.5* 11/20/2015   HCT 34.7* 11/20/2015   MCV 87.8 11/20/2015   PLT 182 11/20/2015      Chemistry      Component Value Date/Time   NA 138 10/18/2015 0805   NA 142 10/08/2015 0852   K 3.8 10/18/2015 0805   K 4.0 10/08/2015 0852   CL 104 10/18/2015 0805   CO2 26 10/18/2015 0805   CO2 27 10/08/2015 0852   BUN 11 10/18/2015 0805   BUN 9.8 10/08/2015 0852   CREATININE 0.79 10/18/2015 0805   CREATININE 0.7 10/08/2015 0852   CREATININE 0.65 01/08/2015 0946      Component Value Date/Time   CALCIUM 9.0 10/18/2015 0805   CALCIUM 9.2 10/08/2015 0852   ALKPHOS 51 10/08/2015 0852   ALKPHOS 55 02/19/2015 0720   AST 15 10/08/2015 0852   AST 24 02/19/2015 0720   ALT 10 10/08/2015 0852   ALT 19 02/19/2015 0720   BILITOT 0.61 10/08/2015 0852   BILITOT 0.5 02/19/2015 0720      ASSESSMENT & PLAN:  History of B-cell lymphoma The patient remained in  complete remission. I will see her next year as scheduled.   Drug-induced neutropenia (HCC) White count is improving nicely. I recommend observation only.  Skin lesion on examination She has a scab over previous port excision site. Clinically, there is no signs of infection. I reassured the patient  Anemia in neoplastic disease This is likely due to recent treatment. The patient denies recent history of bleeding such as epistaxis, hematuria or hematochezia. She is asymptomatic from the anemia. I will observe for now.       No orders of the defined types were placed in this encounter.   All questions were answered. The patient knows to call the clinic with any problems, questions or concerns. No barriers to learning was detected. I spent 10 minutes counseling the patient face to face. The total time spent in the appointment was 15 minutes and more than 50% was on counseling and review of test results  [place], [place], MD 11/20/2015 3:10 PM

## 2015-11-21 DIAGNOSIS — H40033 Anatomical narrow angle, bilateral: Secondary | ICD-10-CM | POA: Diagnosis not present

## 2015-11-21 DIAGNOSIS — H2513 Age-related nuclear cataract, bilateral: Secondary | ICD-10-CM | POA: Diagnosis not present

## 2015-11-22 ENCOUNTER — Telehealth: Payer: Self-pay | Admitting: *Deleted

## 2015-11-22 NOTE — Telephone Encounter (Signed)
OK 

## 2015-11-22 NOTE — Telephone Encounter (Signed)
Voicemail: "I'm wondering is it okay for me to drink Bragg's apple cider vinegar with water and drink green tea.  A friend said I should not.  I'd rather be safe than sorry.  Please call [phone].

## 2015-11-22 NOTE — Telephone Encounter (Signed)
Informed pt ok to drink apple cider vinegar and green tea per Dr. Alvy Bimler.  She verbalized understanding.

## 2015-11-30 DIAGNOSIS — Z0000 Encounter for general adult medical examination without abnormal findings: Secondary | ICD-10-CM | POA: Diagnosis not present

## 2015-11-30 DIAGNOSIS — Z79899 Other long term (current) drug therapy: Secondary | ICD-10-CM | POA: Diagnosis not present

## 2015-11-30 DIAGNOSIS — R739 Hyperglycemia, unspecified: Secondary | ICD-10-CM | POA: Diagnosis not present

## 2015-12-05 DIAGNOSIS — Z0000 Encounter for general adult medical examination without abnormal findings: Secondary | ICD-10-CM | POA: Diagnosis not present

## 2015-12-05 DIAGNOSIS — R739 Hyperglycemia, unspecified: Secondary | ICD-10-CM | POA: Diagnosis not present

## 2015-12-05 DIAGNOSIS — C8590 Non-Hodgkin lymphoma, unspecified, unspecified site: Secondary | ICD-10-CM | POA: Diagnosis not present

## 2015-12-10 ENCOUNTER — Telehealth: Payer: Self-pay | Admitting: *Deleted

## 2015-12-10 NOTE — Telephone Encounter (Signed)
Pt called to report her granddaughter had Strep throat last week,  Has been treated and was cleared by her Pediatrician to return to school today.  Pt asks if ok to take care of her today after school?  Informed pt that since granddaughter's Pediatrician cleared her to return to school it should be fine for her to be around.  She verbalized understanding.

## 2015-12-10 NOTE — Telephone Encounter (Signed)
"  I saw my PCP 12-05-2015.  My WBC = 2.2.  I need to know if I can keep my 68 yr old granddaughter who had strep throat last Thursday and returned to school today?  I also have a friend under doctor's care who wants me to go out to lunch tomorrow with her.  She can return to work later this week.  I also have a dinner at [employer] with my church I'd like to attend this week.  I need advice.  What should I do.  No repeat labs will be drawn." This nurse advised she avoid people who are sick, perform good hand washing and use hand sanitizer.  Her blood counts were low last week and may be normal after 7 to 14 days so protetion is key.  I advise she avoid all three due to counts low.  Grandaughter is no longer contagious but kids at school may pass new and different germs to her at this age.  No response for friend as we do not know why she's under doctor's care and buffet's are contaminated with sneezers, food touched and food taken off bar and placed back on bar.

## 2015-12-14 DIAGNOSIS — Z124 Encounter for screening for malignant neoplasm of cervix: Secondary | ICD-10-CM | POA: Diagnosis not present

## 2015-12-14 DIAGNOSIS — Z01419 Encounter for gynecological examination (general) (routine) without abnormal findings: Secondary | ICD-10-CM | POA: Diagnosis not present

## 2015-12-14 DIAGNOSIS — Z6822 Body mass index (BMI) 22.0-22.9, adult: Secondary | ICD-10-CM | POA: Diagnosis not present

## 2015-12-14 DIAGNOSIS — Z1231 Encounter for screening mammogram for malignant neoplasm of breast: Secondary | ICD-10-CM | POA: Diagnosis not present

## 2016-01-17 ENCOUNTER — Telehealth: Payer: Self-pay | Admitting: *Deleted

## 2016-01-17 NOTE — Telephone Encounter (Signed)
  Oncology Nurse Navigator Documentation  Navigator Location: [facility] (01/17/16 1517) Navigator Encounter Type: 6 month;Telephone (01/17/16 1517)           Patient Visit Type: Follow-up (01/17/16 1517) Treatment Phase: Post-Tx Follow-up (01/17/16 1517)       Placed 61-month post-tmt follow-up call to check on Tabitha Jackson's patient's well-being.  She reported:  "I'm doing great."  Has returned to work part-time.  Sense of taste returning.  Eating/drinking whatever she wants.  Mouth is dry, drinking lots of water.  Going to gym every morning to exercise.  Saw PCP in December, "gave me an A+".  Recently saw dentist.  Denies pain. She confirmed understanding of mid-April appts. She understands I can be contacted with needs/concerns.  Gayleen Orem, RN, BSN, [place] [facility] [place] [phone]                          Time Spent with Patient: 30 (01/17/16 1517)

## 2016-03-04 DIAGNOSIS — H6983 Other specified disorders of Eustachian tube, bilateral: Secondary | ICD-10-CM | POA: Diagnosis not present

## 2016-03-04 DIAGNOSIS — H9041 Sensorineural hearing loss, unilateral, right ear, with unrestricted hearing on the contralateral side: Secondary | ICD-10-CM | POA: Diagnosis not present

## 2016-03-04 DIAGNOSIS — H7201 Central perforation of tympanic membrane, right ear: Secondary | ICD-10-CM | POA: Diagnosis not present

## 2016-03-13 ENCOUNTER — Telehealth: Payer: Self-pay | Admitting: *Deleted

## 2016-03-13 NOTE — Telephone Encounter (Signed)
Informed pt of Dr. Gorsuch's message below. She verbalized understanding.  

## 2016-03-13 NOTE — Telephone Encounter (Signed)
Tums are OK Try to avoid dairy products Can also try OTC prilosec or Zantac to see if that helps

## 2016-03-13 NOTE — Telephone Encounter (Signed)
Pt reports gas pains w/ eating.  She wants to know what Dr. Alvy Bimler recommends.  Denies Heartburn or any n/v/d.  Has some constipation managed w/ drinking prune juice. Takes occasional Tums but not sure if it is ok?  Or is there some other medication she should be taking?

## 2016-04-07 ENCOUNTER — Other Ambulatory Visit (HOSPITAL_BASED_OUTPATIENT_CLINIC_OR_DEPARTMENT_OTHER): Payer: Medicare Other

## 2016-04-07 ENCOUNTER — Ambulatory Visit (HOSPITAL_COMMUNITY)
Admission: RE | Admit: 2016-04-07 | Discharge: 2016-04-07 | Disposition: A | Discharge status: Home / Self Care | Payer: Medicare Other | Source: Ambulatory Visit | Attending: Hematology and Oncology | Admitting: Hematology and Oncology

## 2016-04-07 DIAGNOSIS — C8331 Diffuse large B-cell lymphoma, lymph nodes of head, face, and neck: Secondary | ICD-10-CM | POA: Insufficient documentation

## 2016-04-07 DIAGNOSIS — R918 Other nonspecific abnormal finding of lung field: Secondary | ICD-10-CM | POA: Diagnosis not present

## 2016-04-07 DIAGNOSIS — R5383 Other fatigue: Secondary | ICD-10-CM

## 2016-04-07 DIAGNOSIS — R938 Abnormal findings on diagnostic imaging of other specified body structures: Secondary | ICD-10-CM | POA: Diagnosis not present

## 2016-04-07 DIAGNOSIS — C8590 Non-Hodgkin lymphoma, unspecified, unspecified site: Secondary | ICD-10-CM | POA: Diagnosis not present

## 2016-04-07 LAB — CBC WITH DIFFERENTIAL/PLATELET
BASO%: 1.2 % (ref 0.0–2.0)
Basophils Absolute: 0 10*3/uL (ref 0.0–0.1)
EOS%: 4.1 % (ref 0.0–7.0)
Eosinophils Absolute: 0.1 10*3/uL (ref 0.0–0.5)
HCT: 38.1 % (ref 34.8–46.6)
HGB: 12.5 g/dL (ref 11.6–15.9)
LYMPH%: 34.7 % (ref 14.0–49.7)
MCH: 29.4 pg (ref 25.1–34.0)
MCHC: 32.9 g/dL (ref 31.5–36.0)
MCV: 89.6 fL (ref 79.5–101.0)
MONO#: 0.1 10*3/uL (ref 0.1–0.9)
MONO%: 9.1 % (ref 0.0–14.0)
NEUT#: 0.8 10*3/uL — ABNORMAL LOW (ref 1.5–6.5)
NEUT%: 50.9 % (ref 38.4–76.8)
Platelets: 167 10*3/uL (ref 145–400)
RBC: 4.25 10*6/uL (ref 3.70–5.45)
RDW: 14.3 % (ref 11.2–14.5)
WBC: 1.6 10*3/uL — ABNORMAL LOW (ref 3.9–10.3)
lymph#: 0.5 10*3/uL — ABNORMAL LOW (ref 0.9–3.3)

## 2016-04-07 LAB — COMPREHENSIVE METABOLIC PANEL
ALT: 15 U/L (ref 0–55)
AST: 22 U/L (ref 5–34)
Albumin: 3.9 g/dL (ref 3.5–5.0)
Alkaline Phosphatase: 53 U/L (ref 40–150)
Anion Gap: 9 mEq/L (ref 3–11)
BUN: 6.5 mg/dL — ABNORMAL LOW (ref 7.0–26.0)
CO2: 28 mEq/L (ref 22–29)
Calcium: 9.6 mg/dL (ref 8.4–10.4)
Chloride: 105 mEq/L (ref 98–109)
Creatinine: 0.8 mg/dL (ref 0.6–1.1)
EGFR: 89 mL/min/{1.73_m2} — ABNORMAL LOW (ref >90)
Glucose: 87 mg/dl (ref 70–140)
Potassium: 4.1 mEq/L (ref 3.5–5.1)
Sodium: 142 mEq/L (ref 136–145)
Total Bilirubin: 0.64 mg/dL (ref 0.20–1.20)
Total Protein: 7 g/dL (ref 6.4–8.3)

## 2016-04-07 LAB — LACTATE DEHYDROGENASE: LDH: 163 U/L (ref 125–245)

## 2016-04-07 LAB — TSH: TSH: 1.13 m(IU)/L (ref 0.308–3.960)

## 2016-04-07 LAB — GLUCOSE, CAPILLARY: Glucose-Capillary: 85 mg/dL (ref 65–99)

## 2016-04-07 MED ORDER — FLUDEOXYGLUCOSE F - 18 (FDG) INJECTION
6.7300 | Freq: Once | INTRAVENOUS | Status: AC | PRN
  Administered 2016-04-07: 6.73 via INTRAVENOUS

## 2016-04-08 ENCOUNTER — Encounter: Payer: Self-pay | Admitting: *Deleted

## 2016-04-08 ENCOUNTER — Ambulatory Visit (HOSPITAL_BASED_OUTPATIENT_CLINIC_OR_DEPARTMENT_OTHER): Payer: Medicare Other | Admitting: Hematology and Oncology

## 2016-04-08 ENCOUNTER — Encounter: Payer: Self-pay | Admitting: Hematology and Oncology

## 2016-04-08 VITALS — BP 106/76 | HR 58 | Temp 97.7°F | Resp 18 | Ht 65.0 in | Wt 137.5 lb

## 2016-04-08 DIAGNOSIS — T451X5A Adverse effect of antineoplastic and immunosuppressive drugs, initial encounter: Secondary | ICD-10-CM

## 2016-04-08 DIAGNOSIS — D702 Other drug-induced agranulocytosis: Secondary | ICD-10-CM

## 2016-04-08 DIAGNOSIS — K117 Disturbances of salivary secretion: Secondary | ICD-10-CM

## 2016-04-08 DIAGNOSIS — M7989 Other specified soft tissue disorders: Secondary | ICD-10-CM | POA: Diagnosis not present

## 2016-04-08 DIAGNOSIS — R682 Dry mouth, unspecified: Secondary | ICD-10-CM

## 2016-04-08 DIAGNOSIS — G620 Drug-induced polyneuropathy: Secondary | ICD-10-CM

## 2016-04-08 DIAGNOSIS — R918 Other nonspecific abnormal finding of lung field: Secondary | ICD-10-CM

## 2016-04-08 DIAGNOSIS — R911 Solitary pulmonary nodule: Secondary | ICD-10-CM

## 2016-04-08 DIAGNOSIS — Z515 Encounter for palliative care: Secondary | ICD-10-CM | POA: Insufficient documentation

## 2016-04-08 DIAGNOSIS — M799 Soft tissue disorder, unspecified: Secondary | ICD-10-CM

## 2016-04-08 DIAGNOSIS — Z8572 Personal history of non-Hodgkin lymphomas: Secondary | ICD-10-CM

## 2016-04-08 NOTE — Progress Notes (Signed)
[place] OFFICE PROGRESS NOTE  Patient Care Team: Jani Gravel, MD as PCP - General (Internal Medicine) [name], MD as Consulting Physician (Hematology and Oncology) [name], MD as Consulting Physician (Otolaryngology) Leota Sauers, RN as Oncology Nurse Navigator Thea Silversmith, MD as Consulting Physician (Radiation Oncology)  SUMMARY OF ONCOLOGIC HISTORY:   History of B-cell lymphoma   01/31/2015 Imaging CT scan of the neck showed large mass centered around the right parapharyngeal space, measuring approximately 3.6 cm with bulky lymphadenopathy on the right side. Incidentally 2 nodules were found.   02/05/2015 Pathology Results Accession: [id number] biopsy confirmed diffuse large B-cell lymphoma   02/05/2015 Surgery She had excisional lymph node biopsy of the right neck mass   02/16/2015 Imaging  echocardiogram showed preserved ejection fraction.   02/19/2015 Bone Marrow Biopsy Accession: [id number] BM biopsy was negative for cancer   02/21/2015 Imaging  she had PET CT scan which showed stage II disease.   02/26/2015 Procedure  she had placement of Infuse-a-Port.   03/01/2015 - 04/20/2015 Chemotherapy  she received 3 cycles of R-CHOP chemotherapy with 2 cycles of IT methotrexate   03/22/2015 Adverse Reaction Cycle 2 of RCHOP chemotherapy is delayed due to neutropenia    05/10/2015 Imaging PET scan showed positive response to treatment   06/11/2015 - 07/09/2015 Radiation Therapy Helical IMRT Tomotherapy to R neck:  40 Gy at 2 Gy per fraction x 20 fractions.   10/08/2015 Imaging PET scan showed no evidence of disease recurrence   10/18/2015 Procedure Port-a-cath removed.   04/07/2016 PET scan Mild increase in FDG uptake associated with the right hilum. The degree of uptake is nonspecific currently 4.08. This area warrants attention on follow-up imaging.    INTERVAL HISTORY: Please see below for problem oriented charting. She returns for further follow-up. Denies recent  infection. She complained of persistent dry mouth. She also has very mild neuropathy in the feet. She denies new lymphadenopathy  REVIEW OF SYSTEMS:   Constitutional: Denies fevers, chills or abnormal weight loss Eyes: Denies blurriness of vision Ears, nose, mouth, throat, and face: Denies mucositis or sore throat Respiratory: Denies cough, dyspnea or wheezes Cardiovascular: Denies palpitation, chest discomfort or lower extremity swelling Gastrointestinal:  Denies nausea, heartburn or change in bowel habits Skin: Denies abnormal skin rashes Lymphatics: Denies new lymphadenopathy or easy bruising Neurological:Denies numbness, tingling or new weaknesses Behavioral/Psych: Mood is stable, no new changes  All other systems were reviewed with the patient and are negative.  I have reviewed the past medical history, past surgical history, social history and family history with the patient and they are unchanged from previous note.  ALLERGIES:  is allergic to erythromycin; hydrocodone; peanut-containing drug products; and biaxin.  MEDICATIONS:  Current Outpatient Prescriptions  Medication Sig Dispense Refill  . emollient (BIAFINE) cream Apply 1 application topically 3 (three) times daily as needed (apply to right neck when has radiation).     . hydroxypropyl methylcellulose / hypromellose (ISOPTO TEARS / GONIOVISC) 2.5 % ophthalmic solution Place 2-3 drops into both eyes 3 (three) times daily as needed for dry eyes.     No current facility-administered medications for this visit.    PHYSICAL EXAMINATION: ECOG PERFORMANCE STATUS: 0 - Asymptomatic  Filed Vitals:   04/08/16 0924  BP: 106/76  Pulse: 58  Temp: 97.7 F (36.5 C)  Resp: 18   Filed Weights   04/08/16 0924  Weight: 137 lb 8 oz (62.37 kg)    GENERAL:alert, no distress and comfortable SKIN: skin color,  texture, turgor are normal, no rashes or significant lesions EYES: normal, Conjunctiva are pink and non-injected, sclera  clear OROPHARYNX:no exudate, no erythema and lips, buccal mucosa, and tongue normal  NECK: supple, thyroid normal size, non-tender, without nodularity LYMPH:  no palpable lymphadenopathy in the cervical, axillary or inguinal LUNGS: clear to auscultation and percussion with normal breathing effort HEART: regular rate & rhythm and no murmurs and no lower extremity edema ABDOMEN:abdomen soft, non-tender and normal bowel sounds Musculoskeletal:no cyanosis of digits and no clubbing  NEURO: alert & oriented x 3 with fluent speech, no focal motor/sensory deficits  LABORATORY DATA:  I have reviewed the data as listed    Component Value Date/Time   NA 142 04/07/2016 0900   NA 138 10/18/2015 0805   K 4.1 04/07/2016 0900   K 3.8 10/18/2015 0805   CL 104 10/18/2015 0805   CO2 28 04/07/2016 0900   CO2 26 10/18/2015 0805   GLUCOSE 87 04/07/2016 0900   GLUCOSE 88 10/18/2015 0805   BUN 6.5* 04/07/2016 0900   BUN 11 10/18/2015 0805   CREATININE 0.8 04/07/2016 0900   CREATININE 0.79 10/18/2015 0805   CREATININE 0.65 01/08/2015 0946   CALCIUM 9.6 04/07/2016 0900   CALCIUM 9.0 10/18/2015 0805   PROT 7.0 04/07/2016 0900   PROT 6.9 02/19/2015 0720   ALBUMIN 3.9 04/07/2016 0900   ALBUMIN 3.9 02/19/2015 0720   AST 22 04/07/2016 0900   AST 24 02/19/2015 0720   ALT 15 04/07/2016 0900   ALT 19 02/19/2015 0720   ALKPHOS 53 04/07/2016 0900   ALKPHOS 55 02/19/2015 0720   BILITOT 0.64 04/07/2016 0900   BILITOT 0.5 02/19/2015 0720   GFRNONAA >60 10/18/2015 0805   GFRAA >60 10/18/2015 0805    No results found for: SPEP, UPEP  Lab Results  Component Value Date   WBC 1.6* 04/07/2016   NEUTROABS 0.8* 04/07/2016   HGB 12.5 04/07/2016   HCT 38.1 04/07/2016   MCV 89.6 04/07/2016   PLT 167 04/07/2016      Chemistry      Component Value Date/Time   NA 142 04/07/2016 0900   NA 138 10/18/2015 0805   K 4.1 04/07/2016 0900   K 3.8 10/18/2015 0805   CL 104 10/18/2015 0805   CO2 28 04/07/2016 0900    CO2 26 10/18/2015 0805   BUN 6.5* 04/07/2016 0900   BUN 11 10/18/2015 0805   CREATININE 0.8 04/07/2016 0900   CREATININE 0.79 10/18/2015 0805   CREATININE 0.65 01/08/2015 0946      Component Value Date/Time   CALCIUM 9.6 04/07/2016 0900   CALCIUM 9.0 10/18/2015 0805   ALKPHOS 53 04/07/2016 0900   ALKPHOS 55 02/19/2015 0720   AST 22 04/07/2016 0900   AST 24 02/19/2015 0720   ALT 15 04/07/2016 0900   ALT 19 02/19/2015 0720   BILITOT 0.64 04/07/2016 0900   BILITOT 0.5 02/19/2015 0720       RADIOGRAPHIC STUDIES: I have personally reviewed the radiological images as listed and agreed with the findings in the report. Nm Pet Image Restag (ps) Skull Base To Thigh  04/07/2016  CLINICAL DATA:  Subsequent treatment strategy for lymphoma. EXAM: NUCLEAR MEDICINE PET SKULL BASE TO THIGH TECHNIQUE: 6.7 mCi F-18 FDG was injected intravenously. Full-ring PET imaging was performed from the skull base to thigh after the radiotracer. CT data was obtained and used for attenuation correction and anatomic localization. FASTING BLOOD GLUCOSE:  Value: 85 mg/dl COMPARISON:  10/08/2015 FINDINGS: NECK No hypermetabolic  lymph nodes in the neck. CHEST Mild asymmetric increased uptake within the right hilar region has an SUV max equal to 4.08. Previously 3.29. Again noted are small sub solid nodules in the right lung. Index nodule in the superior segment of right lower lobe measures 6 mm, image 23 of series 6. Previously 7 mm. There are 2 ground-glass attenuating nodules within the right upper lobe these are unchanged in size from previous exam measuring up to 6 mm, image 28 of series 6. No new or enlarging nodules identified. ABDOMEN/PELVIS No abnormal hypermetabolic activity within the liver, pancreas, adrenal glands, or spleen. No hypermetabolic lymph nodes in the abdomen or pelvis. SKELETON No focal hypermetabolic activity to suggest skeletal metastasis. Fluid attenuating structure associated with the left gluteus  musculature a measures 3.1 cm and has an SUV max equal to 1.7. Previously this measured 3.4 cm and had an SUV max equal to 2.6. IMPRESSION: 1. Mild increase in FDG uptake associated with the right hilum. The degree of uptake is nonspecific currently 4.08. This area warrants attention on follow-up imaging. 2. Stable sub solid nodules within the right lung. Too small to characterize by PET-CT. 3. The fluid attenuating structure overlying the left gluteus musculature is slightly decreased in size and degree of FDG uptake compared with previous exam. Electronically Signed   By: Kerby Moors M.D.   On: 04/07/2016 12:04     ASSESSMENT & PLAN:  History of B-cell lymphoma The patient remained in complete remission. Review of CT scan shows some persistent lung nodules and abnormalities I plan to see her back in 4 months with repeat history, physical examination and CT imaging  Multiple lung nodules on CT This was seen on recent imaging study. The patient is currently asymptomatic. I will monitor this with next imaging study in 4 months   Soft tissue lesion of pelvic region She has a soft tissue lesion in the left buttock region. According to the patient, she fell a long time ago. She is not symptomatic and improved on PET I reassured her  Drug-induced neutropenia (HCC) White count is stable She is not symptomatic I recommend observation only.    Neuropathy due to chemotherapeutic drug [facility]) This is related to chemotherapy, mild grade 1 only I recommend high-dose vitamin B-12 supplement to see if this would help.  Xerostomia She is taking regular sips of water and is aggressive with oral hygiene. This is likely related to prior radiation. Observe only. I would not recommend medications for her  Palliative care by specialist We discussed increase physical activity and possible enrollment with the [employer] program with the [facility]. I gave her additional resources from the [place]. We discussed importance of vitamin D supplementation.    Orders Placed This Encounter  Procedures  . CT Chest W Contrast    Standing Status: Future     Number of Occurrences:      Standing Expiration Date: 06/08/2017    Order Specific Question:  Reason for Exam (SYMPTOM  OR DIAGNOSIS REQUIRED)    Answer:  hx lymphoma, lung nodules    Order Specific Question:  Preferred imaging location?    Answer:  [facility]  . CBC with Differential/Platelet    Standing Status: Future     Number of Occurrences:      Standing Expiration Date: 06/08/2017  . Comprehensive metabolic panel    Standing Status: Future     Number of Occurrences:      Standing Expiration Date: 06/08/2017  All questions were answered. The patient knows to call the clinic with any problems, questions or concerns. No barriers to learning was detected. I spent 25 minutes counseling the patient face to face. The total time spent in the appointment was 30 minutes and more than 50% was on counseling and review of test results     [facility], [place], MD 04/08/2016 10:31 AM

## 2016-04-08 NOTE — Assessment & Plan Note (Signed)
This is related to chemotherapy, mild grade 1 only I recommend high-dose vitamin B-12 supplement to see if this would help.

## 2016-04-08 NOTE — Assessment & Plan Note (Signed)
White count is stable She is not symptomatic I recommend observation only.

## 2016-04-08 NOTE — Assessment & Plan Note (Signed)
We discussed increase physical activity and possible enrollment with the [employer] program with the [employer]. I gave her additional resources from the [employer]. We discussed importance of vitamin D supplementation.  

## 2016-04-08 NOTE — Assessment & Plan Note (Signed)
She is taking regular sips of water and is aggressive with oral hygiene. This is likely related to prior radiation. Observe only. I would not recommend medications for her

## 2016-04-08 NOTE — Assessment & Plan Note (Signed)
She has a soft tissue lesion in the left buttock region. According to the patient, she fell a long time ago. She is not symptomatic and improved on PET I reassured her

## 2016-04-08 NOTE — Assessment & Plan Note (Signed)
The patient remained in complete remission. Review of CT scan shows some persistent lung nodules and abnormalities I plan to see her back in 4 months with repeat history, physical examination and CT imaging

## 2016-04-08 NOTE — Assessment & Plan Note (Signed)
This was seen on recent imaging study. The patient is currently asymptomatic. I will monitor this with next imaging study in 4 months

## 2016-04-08 NOTE — Progress Notes (Signed)
  Oncology Nurse Navigator Documentation  Navigator Location: [facility]-Med Onc (04/08/16 5830) Navigator Encounter Type: Follow-up Appt (04/08/16 9407)           Patient Visit Type: MedOnc (04/08/16 0925)   Barriers/Navigation Needs: No barriers at this time (04/08/16 6808)     Met with Tabitha Jackson during Established Patient appt with Dr. Alvy Bimler.  She was accompanied by her husband.  She voiced understanding of Dr. Calton Dach discussion of yesterday's re-staging PET, that she will have follow-up CT and labs in 4 months.    Her reported:  Ongoing dry mouth for which she constantly drinks water, uses Biotene toothpaste and mouthwash, chews sugarless gum.  Occasional R foot-drop.  She voiced understanding of Dr. Calton Dach explanation r/t chemotherapy, her encouragement to take vitamin B12 to help resolve.  Mobile and active.  [facility] [employer]   She denied any needs or concerns; I encouraged her to contact me if that changes before I see her next, she verbalized understanding.  Gayleen Orem, RN, BSN, [place] [facility] [place] [phone]                         Time Spent with Patient: 45 (04/08/16 0925)

## 2016-05-12 ENCOUNTER — Telehealth: Payer: Self-pay | Admitting: *Deleted

## 2016-05-12 NOTE — Telephone Encounter (Signed)
FYI "Can I be in an environment with Sanimaster6?  My son has found area in his home with mold/mildew.  a company will come, take walls down to use Sanimaster6 to  Treat the Mold/Mildew.  He will be out of town on vacation and asked me to come to the house."   This nurse advised son ask someone else.  This fungicidal sanitizer/disinfectant may not be a problem but the house with mold spores in the air could be.  History of cancer places her in category of a possible compromised immune system.  Better safe and not chance inhaling mold spores.  No further questions.  Return number [phone].

## 2016-08-06 ENCOUNTER — Telehealth: Payer: Self-pay | Admitting: *Deleted

## 2016-08-06 NOTE — Telephone Encounter (Signed)
"  I missed a call and do not have the message.  could you tel me what this was about." Advised to come in tomorrow at 9:15 for lab to arrive at [facility] for scans.  No further questions

## 2016-08-07 ENCOUNTER — Ambulatory Visit (HOSPITAL_COMMUNITY)
Admission: RE | Admit: 2016-08-07 | Discharge: 2016-08-07 | Disposition: A | Discharge status: Home / Self Care | Payer: Medicare Other | Source: Ambulatory Visit | Attending: Hematology and Oncology | Admitting: Hematology and Oncology

## 2016-08-07 ENCOUNTER — Other Ambulatory Visit (HOSPITAL_BASED_OUTPATIENT_CLINIC_OR_DEPARTMENT_OTHER): Payer: Medicare Other

## 2016-08-07 ENCOUNTER — Encounter (HOSPITAL_COMMUNITY): Payer: Self-pay

## 2016-08-07 DIAGNOSIS — R918 Other nonspecific abnormal finding of lung field: Secondary | ICD-10-CM

## 2016-08-07 DIAGNOSIS — I2510 Atherosclerotic heart disease of native coronary artery without angina pectoris: Secondary | ICD-10-CM | POA: Diagnosis not present

## 2016-08-07 DIAGNOSIS — Z8572 Personal history of non-Hodgkin lymphomas: Secondary | ICD-10-CM | POA: Diagnosis not present

## 2016-08-07 DIAGNOSIS — C8590 Non-Hodgkin lymphoma, unspecified, unspecified site: Secondary | ICD-10-CM | POA: Diagnosis not present

## 2016-08-07 DIAGNOSIS — R911 Solitary pulmonary nodule: Secondary | ICD-10-CM

## 2016-08-07 LAB — CBC WITH DIFFERENTIAL/PLATELET
BASO%: 1.1 % (ref 0.0–2.0)
Basophils Absolute: 0 10*3/uL (ref 0.0–0.1)
EOS%: 3.5 % (ref 0.0–7.0)
Eosinophils Absolute: 0.1 10*3/uL (ref 0.0–0.5)
HCT: 37.2 % (ref 34.8–46.6)
HGB: 12.1 g/dL (ref 11.6–15.9)
LYMPH%: 30.9 % (ref 14.0–49.7)
MCH: 29.1 pg (ref 25.1–34.0)
MCHC: 32.6 g/dL (ref 31.5–36.0)
MCV: 89.1 fL (ref 79.5–101.0)
MONO#: 0.2 10*3/uL (ref 0.1–0.9)
MONO%: 11.5 % (ref 0.0–14.0)
NEUT#: 1 10*3/uL — ABNORMAL LOW (ref 1.5–6.5)
NEUT%: 53 % (ref 38.4–76.8)
Platelets: 175 10*3/uL (ref 145–400)
RBC: 4.17 10*6/uL (ref 3.70–5.45)
RDW: 13.9 % (ref 11.2–14.5)
WBC: 1.9 10*3/uL — ABNORMAL LOW (ref 3.9–10.3)
lymph#: 0.6 10*3/uL — ABNORMAL LOW (ref 0.9–3.3)

## 2016-08-07 LAB — COMPREHENSIVE METABOLIC PANEL
ALT: 19 U/L (ref 0–55)
AST: 22 U/L (ref 5–34)
Albumin: 3.9 g/dL (ref 3.5–5.0)
Alkaline Phosphatase: 71 U/L (ref 40–150)
Anion Gap: 9 mEq/L (ref 3–11)
BUN: 9.3 mg/dL (ref 7.0–26.0)
CO2: 27 mEq/L (ref 22–29)
Calcium: 9.6 mg/dL (ref 8.4–10.4)
Chloride: 101 mEq/L (ref 98–109)
Creatinine: 0.8 mg/dL (ref 0.6–1.1)
EGFR: 86 mL/min/{1.73_m2} — ABNORMAL LOW (ref >90)
Glucose: 87 mg/dl (ref 70–140)
Potassium: 3.9 mEq/L (ref 3.5–5.1)
Sodium: 137 mEq/L (ref 136–145)
Total Bilirubin: 0.6 mg/dL (ref 0.20–1.20)
Total Protein: 7 g/dL (ref 6.4–8.3)

## 2016-08-07 MED ORDER — IOPAMIDOL (ISOVUE-300) INJECTION 61%
75.0000 mL | Freq: Once | INTRAVENOUS | Status: AC | PRN
  Administered 2016-08-07: 75 mL via INTRAVENOUS

## 2016-08-08 ENCOUNTER — Encounter: Payer: Self-pay | Admitting: Hematology and Oncology

## 2016-08-08 ENCOUNTER — Telehealth: Payer: Self-pay | Admitting: Hematology and Oncology

## 2016-08-08 ENCOUNTER — Ambulatory Visit (HOSPITAL_BASED_OUTPATIENT_CLINIC_OR_DEPARTMENT_OTHER): Payer: Medicare Other | Admitting: Hematology and Oncology

## 2016-08-08 VITALS — BP 130/69 | HR 56 | Temp 97.7°F | Resp 18 | Ht 65.0 in | Wt 136.5 lb

## 2016-08-08 DIAGNOSIS — Z8572 Personal history of non-Hodgkin lymphomas: Secondary | ICD-10-CM | POA: Diagnosis not present

## 2016-08-08 DIAGNOSIS — R918 Other nonspecific abnormal finding of lung field: Secondary | ICD-10-CM | POA: Diagnosis not present

## 2016-08-08 DIAGNOSIS — K117 Disturbances of salivary secretion: Secondary | ICD-10-CM | POA: Diagnosis not present

## 2016-08-08 DIAGNOSIS — D72819 Decreased white blood cell count, unspecified: Secondary | ICD-10-CM

## 2016-08-08 DIAGNOSIS — R682 Dry mouth, unspecified: Secondary | ICD-10-CM

## 2016-08-08 NOTE — Assessment & Plan Note (Signed)
This was seen on recent imaging study. The patient is currently asymptomatic. I will monitor this with next imaging study in 1 yr

## 2016-08-08 NOTE — Assessment & Plan Note (Signed)
She is taking regular sips of water and is aggressive with oral hygiene. This is likely related to prior radiation. Observe only. I would not recommend medications for her

## 2016-08-08 NOTE — Telephone Encounter (Signed)
GAVE PATIENT AVS REPORT AND APPOINTMENTS FOR April 2018

## 2016-08-08 NOTE — Progress Notes (Signed)
[place] OFFICE PROGRESS NOTE  Patient Care Team: Jani Gravel, MD as PCP - General (Internal Medicine) [name], MD as Consulting Physician (Hematology and Oncology) [name], MD as Consulting Physician (Otolaryngology) Leota Sauers, RN as Oncology Nurse Navigator Thea Silversmith, MD as Consulting Physician (Radiation Oncology)  SUMMARY OF ONCOLOGIC HISTORY:   History of B-cell lymphoma   01/31/2015 Imaging    CT scan of the neck showed large mass centered around the right parapharyngeal space, measuring approximately 3.6 cm with bulky lymphadenopathy on the right side. Incidentally 2 nodules were found.      02/05/2015 Pathology Results    Accession: [id number] biopsy confirmed diffuse large B-cell lymphoma      02/05/2015 Surgery    She had excisional lymph node biopsy of the right neck mass      02/16/2015 Imaging     echocardiogram showed preserved ejection fraction.      02/19/2015 Bone Marrow Biopsy    Accession: [id number] BM biopsy was negative for cancer      02/21/2015 Imaging     she had PET CT scan which showed stage II disease.      02/26/2015 Procedure     she had placement of Infuse-a-Port.      03/01/2015 - 04/20/2015 Chemotherapy     she received 3 cycles of R-CHOP chemotherapy with 2 cycles of IT methotrexate      03/22/2015 Adverse Reaction    Cycle 2 of RCHOP chemotherapy is delayed due to neutropenia       05/10/2015 Imaging    PET scan showed positive response to treatment      06/11/2015 - 07/09/2015 Radiation Therapy    Helical IMRT Tomotherapy to R neck:  40 Gy at 2 Gy per fraction x 20 fractions.      10/08/2015 Imaging    PET scan showed no evidence of disease recurrence      10/18/2015 Procedure    Port-a-cath removed.      04/07/2016 PET scan    Mild increase in FDG uptake associated with the right hilum. The degree of uptake is nonspecific currently 4.08. This area warrants attention on follow-up imaging.       08/07/2016 Imaging    CT scan similar right-sided ground-glass and ill-defined pulmonary nodules       INTERVAL HISTORY: Please see below for problem oriented charting. She returns for follow-up. Denies recent infection. No new lymphadenopathy. She has residual dry mouth from prior treatment that it does not bother her. She denies cough or shortness of breath on exertion.  REVIEW OF SYSTEMS:   Constitutional: Denies fevers, chills or abnormal weight loss Eyes: Denies blurriness of vision Ears, nose, mouth, throat, and face: Denies mucositis or sore throat Respiratory: Denies cough, dyspnea or wheezes Cardiovascular: Denies palpitation, chest discomfort or lower extremity swelling Gastrointestinal:  Denies nausea, heartburn or change in bowel habits Skin: Denies abnormal skin rashes Lymphatics: Denies new lymphadenopathy or easy bruising Neurological:Denies numbness, tingling or new weaknesses Behavioral/Psych: Mood is stable, no new changes  All other systems were reviewed with the patient and are negative.  I have reviewed the past medical history, past surgical history, social history and family history with the patient and they are unchanged from previous note.  ALLERGIES:  is allergic to erythromycin; hydrocodone; peanut-containing drug products; and biaxin [clarithromycin].  MEDICATIONS:  Current Outpatient Prescriptions  Medication Sig Dispense Refill  . cholecalciferol (VITAMIN D) 1000 units tablet Take 1,000 Units by mouth daily.    Marland Kitchen  vitamin B-12 (CYANOCOBALAMIN) 1000 MCG tablet Take 1,000 mcg by mouth daily.    Marland Kitchen emollient (BIAFINE) cream Apply 1 application topically 3 (three) times daily as needed (apply to right neck when has radiation).     . hydroxypropyl methylcellulose / hypromellose (ISOPTO TEARS / GONIOVISC) 2.5 % ophthalmic solution Place 2-3 drops into both eyes 3 (three) times daily as needed for dry eyes.     No current facility-administered medications  for this visit.     PHYSICAL EXAMINATION: ECOG PERFORMANCE STATUS: 0 - Asymptomatic  Vitals:   08/08/16 0901  BP: 130/69  Pulse: (!) 56  Resp: 18  Temp: 97.7 F (36.5 C)   Filed Weights   08/08/16 0901  Weight: 136 lb 8 oz (61.9 kg)    GENERAL:alert, no distress and comfortable SKIN: skin color, texture, turgor are normal, no rashes or significant lesions EYES: normal, Conjunctiva are pink and non-injected, sclera clear OROPHARYNX:no exudate, no erythema and lips, buccal mucosa, and tongue normal  NECK: supple, thyroid normal size, non-tender, without nodularity LYMPH:  no palpable lymphadenopathy in the cervical, axillary or inguinal LUNGS: clear to auscultation and percussion with normal breathing effort HEART: regular rate & rhythm and no murmurs and no lower extremity edema ABDOMEN:abdomen soft, non-tender and normal bowel sounds Musculoskeletal:no cyanosis of digits and no clubbing  NEURO: alert & oriented x 3 with fluent speech, no focal motor/sensory deficits  LABORATORY DATA:  I have reviewed the data as listed    Component Value Date/Time   NA 137 08/07/2016 0906   K 3.9 08/07/2016 0906   CL 104 10/18/2015 0805   CO2 27 08/07/2016 0906   GLUCOSE 87 08/07/2016 0906   BUN 9.3 08/07/2016 0906   CREATININE 0.8 08/07/2016 0906   CALCIUM 9.6 08/07/2016 0906   PROT 7.0 08/07/2016 0906   ALBUMIN 3.9 08/07/2016 0906   AST 22 08/07/2016 0906   ALT 19 08/07/2016 0906   ALKPHOS 71 08/07/2016 0906   BILITOT 0.60 08/07/2016 0906   GFRNONAA >60 10/18/2015 0805   GFRAA >60 10/18/2015 0805    No results found for: SPEP, UPEP  Lab Results  Component Value Date   WBC 1.9 (L) 08/07/2016   NEUTROABS 1.0 (L) 08/07/2016   HGB 12.1 08/07/2016   HCT 37.2 08/07/2016   MCV 89.1 08/07/2016   PLT 175 08/07/2016      Chemistry      Component Value Date/Time   NA 137 08/07/2016 0906   K 3.9 08/07/2016 0906   CL 104 10/18/2015 0805   CO2 27 08/07/2016 0906   BUN 9.3  08/07/2016 0906   CREATININE 0.8 08/07/2016 0906      Component Value Date/Time   CALCIUM 9.6 08/07/2016 0906   ALKPHOS 71 08/07/2016 0906   AST 22 08/07/2016 0906   ALT 19 08/07/2016 0906   BILITOT 0.60 08/07/2016 0906       RADIOGRAPHIC STUDIES: I have personally reviewed the radiological images as listed and agreed with the findings in the report. Ct Chest W Contrast  Result Date: 08/07/2016 CLINICAL DATA:  Right-sided cervical B-cell lymphoma diagnosed in 2016. Chemotherapy and radiation therapy complete. Follow-up of pulmonary nodules. EXAM: CT CHEST WITH CONTRAST TECHNIQUE: Multidetector CT imaging of the chest was performed during intravenous contrast administration. CONTRAST:  26m ISOVUE-300 IOPAMIDOL (ISOVUE-300) INJECTION 61% COMPARISON:  PET of 04/07/2016.  No recent chest CT for comparison. FINDINGS: Cardiovascular: Mild cardiomegaly. Left main coronary artery atherosclerosis on image 71/series 2. Mediastinum/Nodes: No supraclavicular adenopathy. No axillary  adenopathy. No mediastinal or hilar adenopathy. Lungs/Pleura: No pleural fluid. Minimal motion degradation throughout. Right upper lobe ground-glass nodules x2 measure 5 and 7 mm on image 55/series 5. These are similar to on the prior (when remeasured). Vague tiny ground-glass densities on the right minor fissure on image 64/series 5 are unchanged, maximally 5 mm. vague right lower lobe 5 mm ground-glass nodule on image 79/series 5 may have been present on image 38/series 6 of the prior PET. Superior segment right lower lobe vague 7 mm nodule on image 54/series 5 is similar. Clear left lung. Upper Abdomen: Normal imaged portions of the liver, spleen, stomach, pancreas, adrenal glands, kidneys. No upper abdominal adenopathy. Musculoskeletal: No acute osseous abnormality. IMPRESSION: 1. Similar right-sided ground-glass and ill-defined pulmonary nodules. 2. No thoracic adenopathy. 3. Coronary artery disease within the left main.  Correlate with risk factors and consider cardiology consultation. Electronically Signed   By: Abigail Miyamoto M.D.   On: 08/07/2016 13:22     ASSESSMENT & PLAN:  History of B-cell lymphoma The patient remained in complete remission. Review of CT scan shows some persistent lung nodules and abnormalities, stable, likely benign I plan to see her back in 6 months with repeat history, physical examination and lab only I recommend vitamin D supplement. I recommend pneumococcal vaccination along with influenza vaccination in the fall  Chronic leukopenia She has chronic leukopenia, likely induced by prior chemotherapy. She is not symptomatic. Recommend observation only  Multiple lung nodules on CT This was seen on recent imaging study. The patient is currently asymptomatic. I will monitor this with next imaging study in 1 yr  Xerostomia She is taking regular sips of water and is aggressive with oral hygiene. This is likely related to prior radiation. Observe only. I would not recommend medications for her   Orders Placed This Encounter  Procedures  . Comprehensive metabolic panel    Standing Status:   Future    Standing Expiration Date:   09/12/2017  . CBC with Differential/Platelet    Standing Status:   Future    Standing Expiration Date:   09/12/2017  . Lactate dehydrogenase (LDH)    Standing Status:   Future    Standing Expiration Date:   08/08/2017   All questions were answered. The patient knows to call the clinic with any problems, questions or concerns. No barriers to learning was detected. I spent 20 minutes counseling the patient face to face. The total time spent in the appointment was 25 minutes and more than 50% was on counseling and review of test results     [facility], [place], MD 08/08/2016 9:28 AM

## 2016-08-08 NOTE — Assessment & Plan Note (Addendum)
The patient remained in complete remission. Review of CT scan shows some persistent lung nodules and abnormalities, stable, likely benign I plan to see her back in 6 months with repeat history, physical examination and lab only I recommend vitamin D supplement. I recommend pneumococcal vaccination along with influenza vaccination in the fall

## 2016-08-08 NOTE — Assessment & Plan Note (Signed)
She has chronic leukopenia, likely induced by prior chemotherapy. She is not symptomatic. Recommend observation only 

## 2016-09-02 DIAGNOSIS — H7201 Central perforation of tympanic membrane, right ear: Secondary | ICD-10-CM | POA: Diagnosis not present

## 2016-09-02 DIAGNOSIS — H9041 Sensorineural hearing loss, unilateral, right ear, with unrestricted hearing on the contralateral side: Secondary | ICD-10-CM | POA: Diagnosis not present

## 2016-09-16 DIAGNOSIS — Z0000 Encounter for general adult medical examination without abnormal findings: Secondary | ICD-10-CM | POA: Diagnosis not present

## 2016-09-16 DIAGNOSIS — Z008 Encounter for other general examination: Secondary | ICD-10-CM | POA: Diagnosis not present

## 2016-09-16 DIAGNOSIS — R7309 Other abnormal glucose: Secondary | ICD-10-CM | POA: Diagnosis not present

## 2016-09-16 DIAGNOSIS — Z13220 Encounter for screening for lipoid disorders: Secondary | ICD-10-CM | POA: Diagnosis not present

## 2016-09-16 DIAGNOSIS — E559 Vitamin D deficiency, unspecified: Secondary | ICD-10-CM | POA: Diagnosis not present

## 2016-09-16 DIAGNOSIS — Z131 Encounter for screening for diabetes mellitus: Secondary | ICD-10-CM | POA: Diagnosis not present

## 2016-09-16 DIAGNOSIS — Z23 Encounter for immunization: Secondary | ICD-10-CM | POA: Diagnosis not present

## 2016-09-16 DIAGNOSIS — Z0001 Encounter for general adult medical examination with abnormal findings: Secondary | ICD-10-CM | POA: Diagnosis not present

## 2016-09-23 DIAGNOSIS — Z0000 Encounter for general adult medical examination without abnormal findings: Secondary | ICD-10-CM | POA: Diagnosis not present

## 2016-09-23 DIAGNOSIS — Z780 Asymptomatic menopausal state: Secondary | ICD-10-CM | POA: Diagnosis not present

## 2016-09-30 DIAGNOSIS — M810 Age-related osteoporosis without current pathological fracture: Secondary | ICD-10-CM | POA: Diagnosis not present

## 2016-11-18 DIAGNOSIS — H40033 Anatomical narrow angle, bilateral: Secondary | ICD-10-CM | POA: Diagnosis not present

## 2016-11-18 DIAGNOSIS — H04123 Dry eye syndrome of bilateral lacrimal glands: Secondary | ICD-10-CM | POA: Diagnosis not present

## 2016-12-24 DIAGNOSIS — Z01419 Encounter for gynecological examination (general) (routine) without abnormal findings: Secondary | ICD-10-CM | POA: Diagnosis not present

## 2016-12-24 DIAGNOSIS — Z6822 Body mass index (BMI) 22.0-22.9, adult: Secondary | ICD-10-CM | POA: Diagnosis not present

## 2016-12-24 DIAGNOSIS — Z1231 Encounter for screening mammogram for malignant neoplasm of breast: Secondary | ICD-10-CM | POA: Diagnosis not present

## 2017-03-24 ENCOUNTER — Telehealth: Payer: Self-pay | Admitting: Hematology and Oncology

## 2017-03-24 NOTE — Telephone Encounter (Signed)
Called patient to see if she would be willing to schedule her appointment with NG on 4/25 instead of 4/26. Spoke with husband he stated that the change would be okay.

## 2017-04-15 ENCOUNTER — Other Ambulatory Visit (HOSPITAL_BASED_OUTPATIENT_CLINIC_OR_DEPARTMENT_OTHER): Payer: Medicare Other

## 2017-04-15 ENCOUNTER — Telehealth: Payer: Self-pay | Admitting: Hematology and Oncology

## 2017-04-15 ENCOUNTER — Ambulatory Visit (HOSPITAL_BASED_OUTPATIENT_CLINIC_OR_DEPARTMENT_OTHER): Payer: Medicare Other | Admitting: Hematology and Oncology

## 2017-04-15 VITALS — BP 116/66 | HR 57 | Temp 97.6°F | Resp 18 | Ht 65.0 in | Wt 138.0 lb

## 2017-04-15 DIAGNOSIS — D709 Neutropenia, unspecified: Secondary | ICD-10-CM | POA: Diagnosis not present

## 2017-04-15 DIAGNOSIS — D72819 Decreased white blood cell count, unspecified: Secondary | ICD-10-CM

## 2017-04-15 DIAGNOSIS — Z8572 Personal history of non-Hodgkin lymphomas: Secondary | ICD-10-CM

## 2017-04-15 DIAGNOSIS — R918 Other nonspecific abnormal finding of lung field: Secondary | ICD-10-CM | POA: Diagnosis not present

## 2017-04-15 LAB — CBC WITH DIFFERENTIAL/PLATELET
BASO%: 0.5 % (ref 0.0–2.0)
Basophils Absolute: 0 10*3/uL (ref 0.0–0.1)
EOS%: 3.6 % (ref 0.0–7.0)
Eosinophils Absolute: 0.1 10*3/uL (ref 0.0–0.5)
HCT: 38.2 % (ref 34.8–46.6)
HGB: 12.6 g/dL (ref 11.6–15.9)
LYMPH%: 36 % (ref 14.0–49.7)
MCH: 29 pg (ref 25.1–34.0)
MCHC: 33 g/dL (ref 31.5–36.0)
MCV: 87.8 fL (ref 79.5–101.0)
MONO#: 0.2 10*3/uL (ref 0.1–0.9)
MONO%: 10.4 % (ref 0.0–14.0)
NEUT#: 1.1 10*3/uL — ABNORMAL LOW (ref 1.5–6.5)
NEUT%: 49.5 % (ref 38.4–76.8)
Platelets: 168 10*3/uL (ref 145–400)
RBC: 4.35 10*6/uL (ref 3.70–5.45)
RDW: 13.7 % (ref 11.2–14.5)
WBC: 2.2 10*3/uL — ABNORMAL LOW (ref 3.9–10.3)
lymph#: 0.8 10*3/uL — ABNORMAL LOW (ref 0.9–3.3)

## 2017-04-15 LAB — COMPREHENSIVE METABOLIC PANEL
ALT: 15 U/L (ref 0–55)
AST: 20 U/L (ref 5–34)
Albumin: 4.1 g/dL (ref 3.5–5.0)
Alkaline Phosphatase: 60 U/L (ref 40–150)
Anion Gap: 10 mEq/L (ref 3–11)
BUN: 6.8 mg/dL — ABNORMAL LOW (ref 7.0–26.0)
CO2: 29 mEq/L (ref 22–29)
Calcium: 9.9 mg/dL (ref 8.4–10.4)
Chloride: 102 mEq/L (ref 98–109)
Creatinine: 0.8 mg/dL (ref 0.6–1.1)
EGFR: 84 mL/min/{1.73_m2} — ABNORMAL LOW (ref >90)
Glucose: 96 mg/dl (ref 70–140)
Potassium: 4.8 mEq/L (ref 3.5–5.1)
Sodium: 141 mEq/L (ref 136–145)
Total Bilirubin: 0.65 mg/dL (ref 0.20–1.20)
Total Protein: 7 g/dL (ref 6.4–8.3)

## 2017-04-15 LAB — LACTATE DEHYDROGENASE: LDH: 169 U/L (ref 125–245)

## 2017-04-15 NOTE — Telephone Encounter (Signed)
Gave patient Avs and calender per 4/25 los.

## 2017-04-16 ENCOUNTER — Encounter: Payer: Self-pay | Admitting: Hematology and Oncology

## 2017-04-16 ENCOUNTER — Ambulatory Visit: Payer: Medicare Other | Admitting: Hematology and Oncology

## 2017-04-16 NOTE — Assessment & Plan Note (Signed)
She has chronic leukopenia, likely induced by prior chemotherapy. She is not symptomatic. Recommend observation only 

## 2017-04-16 NOTE — Assessment & Plan Note (Signed)
Examination is satisfactory without signs of disease recurrence I recommend repeat imaging study 1 more time and if CT imaging is negative, we would discontinue surveillance imaging study

## 2017-04-16 NOTE — Assessment & Plan Note (Signed)
The patient had multiple lung nodules. I recommend repeat imaging study to exclude cancer recurrence and she agreed

## 2017-04-16 NOTE — Progress Notes (Signed)
[place] OFFICE PROGRESS NOTE  Patient Care Team: Jani Gravel, MD as PCP - General (Internal Medicine) [name], MD as Consulting Physician (Hematology and Oncology) [name], MD as Consulting Physician (Otolaryngology) Leota Sauers, RN as Oncology Nurse Navigator Thea Silversmith, MD (Inactive) as Consulting Physician (Radiation Oncology)  SUMMARY OF ONCOLOGIC HISTORY:   History of B-cell lymphoma   01/31/2015 Imaging    CT scan of the neck showed large mass centered around the right parapharyngeal space, measuring approximately 3.6 cm with bulky lymphadenopathy on the right side. Incidentally 2 nodules were found.      02/05/2015 Pathology Results    Accession: [id number] biopsy confirmed diffuse large B-cell lymphoma      02/05/2015 Surgery    She had excisional lymph node biopsy of the right neck mass      02/16/2015 Imaging     echocardiogram showed preserved ejection fraction.      02/19/2015 Bone Marrow Biopsy    Accession: [id number] BM biopsy was negative for cancer      02/21/2015 Imaging     she had PET CT scan which showed stage II disease.      02/26/2015 Procedure     she had placement of Infuse-a-Port.      03/01/2015 - 04/20/2015 Chemotherapy     she received 3 cycles of R-CHOP chemotherapy with 2 cycles of IT methotrexate      03/22/2015 Adverse Reaction    Cycle 2 of RCHOP chemotherapy is delayed due to neutropenia       05/10/2015 Imaging    PET scan showed positive response to treatment      06/11/2015 - 07/09/2015 Radiation Therapy    Helical IMRT Tomotherapy to R neck:  40 Gy at 2 Gy per fraction x 20 fractions.      10/08/2015 Imaging    PET scan showed no evidence of disease recurrence      10/18/2015 Procedure    Port-a-cath removed.      04/07/2016 PET scan    Mild increase in FDG uptake associated with the right hilum. The degree of uptake is nonspecific currently 4.08. This area warrants attention on follow-up imaging.       08/07/2016 Imaging    CT scan similar right-sided ground-glass and ill-defined pulmonary nodules       INTERVAL HISTORY: Please see below for problem oriented charting. She returns with her husband for further follow-up She denies recent infection No new lymphadenopathy She has mild dry mouth but denies swallowing difficulties or dental issues.  REVIEW OF SYSTEMS:   Constitutional: Denies fevers, chills or abnormal weight loss Eyes: Denies blurriness of vision Ears, nose, mouth, throat, and face: Denies mucositis or sore throat Respiratory: Denies cough, dyspnea or wheezes Cardiovascular: Denies palpitation, chest discomfort or lower extremity swelling Gastrointestinal:  Denies nausea, heartburn or change in bowel habits Skin: Denies abnormal skin rashes Lymphatics: Denies new lymphadenopathy or easy bruising Neurological:Denies numbness, tingling or new weaknesses Behavioral/Psych: Mood is stable, no new changes  All other systems were reviewed with the patient and are negative.  I have reviewed the past medical history, past surgical history, social history and family history with the patient and they are unchanged from previous note.  ALLERGIES:  is allergic to erythromycin; hydrocodone; peanut-containing drug products; and biaxin [clarithromycin].  MEDICATIONS:  Current Outpatient Prescriptions  Medication Sig Dispense Refill  . aspirin EC 81 MG tablet Take 81 mg by mouth daily.    . cholecalciferol (VITAMIN D)  1000 units tablet Take 1,000 Units by mouth daily.    Marland Kitchen emollient (BIAFINE) cream Apply 1 application topically 3 (three) times daily as needed (apply to right neck when has radiation).     . hydroxypropyl methylcellulose / hypromellose (ISOPTO TEARS / GONIOVISC) 2.5 % ophthalmic solution Place 2-3 drops into both eyes 3 (three) times daily as needed for dry eyes.    . Iron Combinations (IRON COMPLEX PO) Take 1 tablet by mouth daily. Over the counter    . vitamin  B-12 (CYANOCOBALAMIN) 1000 MCG tablet Take 1,000 mcg by mouth daily.     No current facility-administered medications for this visit.     PHYSICAL EXAMINATION: ECOG PERFORMANCE STATUS: 0 - Asymptomatic  Vitals:   04/15/17 0840  BP: 116/66  Pulse: (!) 57  Resp: 18  Temp: 97.6 F (36.4 C)   Filed Weights   04/15/17 0840  Weight: 138 lb (62.6 kg)    GENERAL:alert, no distress and comfortable SKIN: skin color, texture, turgor are normal, no rashes or significant lesions EYES: normal, Conjunctiva are pink and non-injected, sclera clear OROPHARYNX:no exudate, no erythema and lips, buccal mucosa, and tongue normal  NECK: supple, thyroid normal size, non-tender, without nodularity LYMPH:  no palpable lymphadenopathy in the cervical, axillary or inguinal LUNGS: clear to auscultation and percussion with normal breathing effort HEART: regular rate & rhythm and no murmurs and no lower extremity edema ABDOMEN:abdomen soft, non-tender and normal bowel sounds Musculoskeletal:no cyanosis of digits and no clubbing  NEURO: alert & oriented x 3 with fluent speech, no focal motor/sensory deficits  LABORATORY DATA:  I have reviewed the data as listed    Component Value Date/Time   NA 141 04/15/2017 0818   K 4.8 04/15/2017 0818   CL 104 10/18/2015 0805   CO2 29 04/15/2017 0818   GLUCOSE 96 04/15/2017 0818   BUN 6.8 (L) 04/15/2017 0818   CREATININE 0.8 04/15/2017 0818   CALCIUM 9.9 04/15/2017 0818   PROT 7.0 04/15/2017 0818   ALBUMIN 4.1 04/15/2017 0818   AST 20 04/15/2017 0818   ALT 15 04/15/2017 0818   ALKPHOS 60 04/15/2017 0818   BILITOT 0.65 04/15/2017 0818   GFRNONAA >60 10/18/2015 0805   GFRAA >60 10/18/2015 0805    No results found for: SPEP, UPEP  Lab Results  Component Value Date   WBC 2.2 (L) 04/15/2017   NEUTROABS 1.1 (L) 04/15/2017   HGB 12.6 04/15/2017   HCT 38.2 04/15/2017   MCV 87.8 04/15/2017   PLT 168 04/15/2017      Chemistry      Component Value  Date/Time   NA 141 04/15/2017 0818   K 4.8 04/15/2017 0818   CL 104 10/18/2015 0805   CO2 29 04/15/2017 0818   BUN 6.8 (L) 04/15/2017 0818   CREATININE 0.8 04/15/2017 0818      Component Value Date/Time   CALCIUM 9.9 04/15/2017 0818   ALKPHOS 60 04/15/2017 0818   AST 20 04/15/2017 0818   ALT 15 04/15/2017 0818   BILITOT 0.65 04/15/2017 0818      ASSESSMENT & PLAN:  History of B-cell lymphoma Examination is satisfactory without signs of disease recurrence I recommend repeat imaging study 1 more time and if CT imaging is negative, we would discontinue surveillance imaging study   Chronic leukopenia She has chronic leukopenia, likely induced by prior chemotherapy. She is not symptomatic. Recommend observation only  Multiple lung nodules on CT The patient had multiple lung nodules. I recommend repeat imaging study  to exclude cancer recurrence and she agreed   Orders Placed This Encounter  Procedures  . CT SOFT TISSUE NECK W CONTRAST    Standing Status:   Future    Standing Expiration Date:   07/16/2018    Order Specific Question:   Reason for Exam (SYMPTOM  OR DIAGNOSIS REQUIRED)    Answer:   tonsil lymphoma, exclude recurrence, lung nodules    Order Specific Question:   Preferred imaging location?    Answer:   [facility]  . CT CHEST W CONTRAST    Standing Status:   Future    Standing Expiration Date:   07/16/2018    Order Specific Question:   Reason for Exam (SYMPTOM  OR DIAGNOSIS REQUIRED)    Answer:   tonsil lymphoma, exclude recurrence, lung nodules    Order Specific Question:   Preferred imaging location?    Answer:   [facility]   All questions were answered. The patient knows to call the clinic with any problems, questions or concerns. No barriers to learning was detected. I spent 15 minutes counseling the patient face to face. The total time spent in the appointment was 20 minutes and more than 50% was on counseling and review of test  results     [name], MD 04/16/2017 7:47 AM

## 2017-04-28 ENCOUNTER — Encounter (HOSPITAL_COMMUNITY): Payer: Self-pay

## 2017-04-28 ENCOUNTER — Ambulatory Visit (HOSPITAL_COMMUNITY)
Admission: RE | Admit: 2017-04-28 | Discharge: 2017-04-28 | Disposition: A | Discharge status: Home / Self Care | Payer: Medicare Other | Source: Ambulatory Visit | Attending: Hematology and Oncology | Admitting: Hematology and Oncology

## 2017-04-28 DIAGNOSIS — Z8572 Personal history of non-Hodgkin lymphomas: Secondary | ICD-10-CM | POA: Diagnosis not present

## 2017-04-28 DIAGNOSIS — R918 Other nonspecific abnormal finding of lung field: Secondary | ICD-10-CM | POA: Insufficient documentation

## 2017-04-28 DIAGNOSIS — C8580 Other specified types of non-Hodgkin lymphoma, unspecified site: Secondary | ICD-10-CM | POA: Diagnosis not present

## 2017-04-28 DIAGNOSIS — Z9889 Other specified postprocedural states: Secondary | ICD-10-CM | POA: Insufficient documentation

## 2017-04-28 DIAGNOSIS — M5030 Other cervical disc degeneration, unspecified cervical region: Secondary | ICD-10-CM | POA: Diagnosis not present

## 2017-04-28 DIAGNOSIS — R911 Solitary pulmonary nodule: Secondary | ICD-10-CM | POA: Diagnosis not present

## 2017-04-28 MED ORDER — IOPAMIDOL (ISOVUE-300) INJECTION 61%
INTRAVENOUS | Status: AC
  Filled 2017-04-28: qty 75

## 2017-04-28 MED ORDER — IOPAMIDOL (ISOVUE-300) INJECTION 61%
75.0000 mL | Freq: Once | INTRAVENOUS | Status: AC | PRN
  Administered 2017-04-28: 75 mL via INTRAVENOUS

## 2017-04-29 ENCOUNTER — Ambulatory Visit (HOSPITAL_BASED_OUTPATIENT_CLINIC_OR_DEPARTMENT_OTHER): Payer: Medicare Other | Admitting: Hematology and Oncology

## 2017-04-29 ENCOUNTER — Encounter: Payer: Self-pay | Admitting: Hematology and Oncology

## 2017-04-29 ENCOUNTER — Telehealth: Payer: Self-pay | Admitting: Hematology and Oncology

## 2017-04-29 VITALS — BP 117/59 | HR 56 | Temp 97.5°F | Resp 18 | Ht 65.0 in | Wt 137.8 lb

## 2017-04-29 DIAGNOSIS — Z8572 Personal history of non-Hodgkin lymphomas: Secondary | ICD-10-CM

## 2017-04-29 DIAGNOSIS — R918 Other nonspecific abnormal finding of lung field: Secondary | ICD-10-CM

## 2017-04-29 NOTE — Progress Notes (Signed)
[place] OFFICE PROGRESS NOTE  Patient Care Team: Jani Gravel, MD as PCP - General (Internal Medicine) [name], MD as Consulting Physician (Hematology and Oncology) [name], MD as Consulting Physician (Otolaryngology) Leota Sauers, RN as Oncology Nurse Navigator Thea Silversmith, MD (Inactive) as Consulting Physician (Radiation Oncology)  SUMMARY OF ONCOLOGIC HISTORY:   History of B-cell lymphoma   01/31/2015 Imaging    CT scan of the neck showed large mass centered around the right parapharyngeal space, measuring approximately 3.6 cm with bulky lymphadenopathy on the right side. Incidentally 2 nodules were found.      02/05/2015 Pathology Results    Accession: [id number] biopsy confirmed diffuse large B-cell lymphoma      02/05/2015 Surgery    She had excisional lymph node biopsy of the right neck mass      02/16/2015 Imaging     echocardiogram showed preserved ejection fraction.      02/19/2015 Bone Marrow Biopsy    Accession: [id number] BM biopsy was negative for cancer      02/21/2015 Imaging     she had PET CT scan which showed stage II disease.      02/26/2015 Procedure     she had placement of Infuse-a-Port.      03/01/2015 - 04/20/2015 Chemotherapy     she received 3 cycles of R-CHOP chemotherapy with 2 cycles of IT methotrexate      03/22/2015 Adverse Reaction    Cycle 2 of RCHOP chemotherapy is delayed due to neutropenia       05/10/2015 Imaging    PET scan showed positive response to treatment      06/11/2015 - 07/09/2015 Radiation Therapy    Helical IMRT Tomotherapy to R neck:  40 Gy at 2 Gy per fraction x 20 fractions.      10/08/2015 Imaging    PET scan showed no evidence of disease recurrence      10/18/2015 Procedure    Port-a-cath removed.      04/07/2016 PET scan    Mild increase in FDG uptake associated with the right hilum. The degree of uptake is nonspecific currently 4.08. This area warrants attention on follow-up  imaging.      08/07/2016 Imaging    CT scan similar right-sided ground-glass and ill-defined pulmonary nodules      04/28/2017 Imaging    CT chest: 1. Stable appearance ground-glass nodules right upper lobe measuring up to 7 mm. 2. 7 mm sub solid nodule superior segment right lower lobe is stable. 3. Additional follow-up with CT at 12 months is recommended. If persistent, repeat CT is recommended every 2 years until 5 years of stability has been established.       04/28/2017 Imaging    CT neck: 1. Subcentimeter lymph nodes in the upper neck without pathologic enlargement to suggest residual or recurrent disease. 2. Stranding of the right parapharyngeal fat likely reflects posttreatment change without residual or recurrent disease. 3. Mild degenerative changes in the upper cervical spine.       INTERVAL HISTORY: Please see below for problem oriented charting. She returns with her husband for further follow-up She is not symptomatic  REVIEW OF SYSTEMS:   Constitutional: Denies fevers, chills or abnormal weight loss Eyes: Denies blurriness of vision Ears, nose, mouth, throat, and face: Denies mucositis or sore throat Respiratory: Denies cough, dyspnea or wheezes Cardiovascular: Denies palpitation, chest discomfort or lower extremity swelling Gastrointestinal:  Denies nausea, heartburn or change in bowel habits Skin: Denies abnormal  skin rashes Lymphatics: Denies new lymphadenopathy or easy bruising Neurological:Denies numbness, tingling or new weaknesses Behavioral/Psych: Mood is stable, no new changes  All other systems were reviewed with the patient and are negative.  I have reviewed the past medical history, past surgical history, social history and family history with the patient and they are unchanged from previous note.  ALLERGIES:  is allergic to erythromycin; hydrocodone; peanut-containing drug products; and biaxin [clarithromycin].  MEDICATIONS:  Current Outpatient  Prescriptions  Medication Sig Dispense Refill  . aspirin EC 81 MG tablet Take 81 mg by mouth daily.    . cholecalciferol (VITAMIN D) 1000 units tablet Take 1,000 Units by mouth daily.    . hydroxypropyl methylcellulose / hypromellose (ISOPTO TEARS / GONIOVISC) 2.5 % ophthalmic solution Place 2-3 drops into both eyes 3 (three) times daily as needed for dry eyes.    . Iron Combinations (IRON COMPLEX PO) Take 1 tablet by mouth daily. Over the counter    . vitamin B-12 (CYANOCOBALAMIN) 1000 MCG tablet Take 1,000 mcg by mouth daily.     No current facility-administered medications for this visit.     PHYSICAL EXAMINATION: ECOG PERFORMANCE STATUS: 0 - Asymptomatic  Vitals:   04/29/17 0911  BP: (!) 117/59  Pulse: (!) 56  Resp: 18  Temp: 97.5 F (36.4 C)   Filed Weights   04/29/17 0911  Weight: 137 lb 12.8 oz (62.5 kg)    GENERAL:alert, no distress and comfortable SKIN: skin color, texture, turgor are normal, no rashes or significant lesions EYES: normal, Conjunctiva are pink and non-injected, sclera clear Musculoskeletal:no cyanosis of digits and no clubbing  NEURO: alert & oriented x 3 with fluent speech, no focal motor/sensory deficits  LABORATORY DATA:  I have reviewed the data as listed    Component Value Date/Time   NA 141 04/15/2017 0818   K 4.8 04/15/2017 0818   CL 104 10/18/2015 0805   CO2 29 04/15/2017 0818   GLUCOSE 96 04/15/2017 0818   BUN 6.8 (L) 04/15/2017 0818   CREATININE 0.8 04/15/2017 0818   CALCIUM 9.9 04/15/2017 0818   PROT 7.0 04/15/2017 0818   ALBUMIN 4.1 04/15/2017 0818   AST 20 04/15/2017 0818   ALT 15 04/15/2017 0818   ALKPHOS 60 04/15/2017 0818   BILITOT 0.65 04/15/2017 0818   GFRNONAA >60 10/18/2015 0805   GFRAA >60 10/18/2015 0805    No results found for: SPEP, UPEP  Lab Results  Component Value Date   WBC 2.2 (L) 04/15/2017   NEUTROABS 1.1 (L) 04/15/2017   HGB 12.6 04/15/2017   HCT 38.2 04/15/2017   MCV 87.8 04/15/2017   PLT 168  04/15/2017      Chemistry      Component Value Date/Time   NA 141 04/15/2017 0818   K 4.8 04/15/2017 0818   CL 104 10/18/2015 0805   CO2 29 04/15/2017 0818   BUN 6.8 (L) 04/15/2017 0818   CREATININE 0.8 04/15/2017 0818      Component Value Date/Time   CALCIUM 9.9 04/15/2017 0818   ALKPHOS 60 04/15/2017 0818   AST 20 04/15/2017 0818   ALT 15 04/15/2017 0818   BILITOT 0.65 04/15/2017 0818       RADIOGRAPHIC STUDIES: I have personally reviewed the radiological images as listed and agreed with the findings in the report. Ct Soft Tissue Neck W Contrast  Result Date: 04/28/2017 CLINICAL DATA:  B-cell Hodgkin's lymphoma. EXAM: CT NECK WITH CONTRAST TECHNIQUE: Multidetector CT imaging of the neck was performed using  the standard protocol following the bolus administration of intravenous contrast. CONTRAST:  63m ISOVUE-300 IOPAMIDOL (ISOVUE-300) INJECTION 61% COMPARISON:  PET scan 04/07/2016. FINDINGS: Pharynx and larynx: Asymmetric attenuation of the parapharyngeal fat on the right likely represent post treatment change without significant residual or recurrent mass. No focal mucosal or submucosal lesions are present. Vocal cords are midline and symmetric. Salivary glands: The submandibular and parotid glands are normal bilaterally. Thyroid: The thyroid is unremarkable. Lymph nodes: Subcentimeter level 1 and level 2 lymph nodes are present bilaterally. There are no enlarged lymph nodes in the upper neck. Scattered small lymph nodes are within normal limits. Vascular: Negative Limited intracranial: Within normal limits. Visualized orbits: The left lens extraction is present. Globes and orbits are within normal limits. Mastoids and visualized paranasal sinuses: Mild mucosal thickening is present in the right maxillary sinus. The remaining paranasal sinuses and mastoid air cells are clear. Skeleton: Mild endplate degenerative changes are present at C3-4, C4-5, and C5-6. Interbody heights are  maintained. Alignment is anatomic. There is straightening of the normal cervical lordosis. No focal lytic or blastic lesions are present. Upper chest: The lung apices are clear. IMPRESSION: 1. Subcentimeter lymph nodes in the upper neck without pathologic enlargement to suggest residual or recurrent disease. 2. Stranding of the right parapharyngeal fat likely reflects posttreatment change without residual or recurrent disease. 3. Mild degenerative changes in the upper cervical spine. Electronically Signed   By: CSan MorelleM.D.   On: 04/28/2017 11:39   Ct Chest W Contrast  Result Date: 04/28/2017 CLINICAL DATA:  Lung nodules in a patient with B-cell Hodgkin's lymphoma. EXAM: CT CHEST WITH CONTRAST TECHNIQUE: Multidetector CT imaging of the chest was performed during intravenous contrast administration. CONTRAST:  755mISOVUE-300 IOPAMIDOL (ISOVUE-300) INJECTION 61% COMPARISON:  08/07/2016 FINDINGS: Cardiovascular: The heart size is normal. No pericardial effusion. No thoracic aortic aneurysm. Coronary artery calcification is noted. Mediastinum/Nodes: No mediastinal lymphadenopathy. There is no hilar lymphadenopathy. There is no axillary lymphadenopathy. The esophagus has normal imaging features. Lungs/Pleura: Sub mm ground-glass nodule right upper lobe (image 60) is stable. Adjacent 5 mm ground-glass nodule unchanged. 7 mm nodule superior segment right lower lobe (image 59) is stable. Subsegmental atelectasis noted left lower lobe. No focal airspace consolidation. No pulmonary edema or pleural effusion. Upper Abdomen: Unremarkable. Musculoskeletal: Bone windows reveal no worrisome lytic or sclerotic osseous lesions. IMPRESSION: 1. Stable appearance ground-glass nodules right upper lobe measuring up to 7 mm. 2. 7 mm sub solid nodule superior segment right lower lobe is stable. 3. Additional follow-up with CT at 12 months is recommended. If persistent, repeat CT is recommended every 2 years until 5 years  of stability has been established. This recommendation follows the consensus statement: Guidelines for Management of Incidental Pulmonary Nodules Detected on CT Images: From the Fleischner Society 2017; Radiology 2017; 284:228-243. Electronically Signed   By: ErMisty Stanley.D.   On: 04/28/2017 11:20    ASSESSMENT & PLAN:  History of B-cell lymphoma Examination is satisfactory without signs of disease recurrence CT is negative. I will see her back in 1 year with history, physical examination and blood work. I recommend ENT follow-up for nasopharyngeal evaluation with endoscopy.  Multiple lung nodules on CT Per radiologist guideline, will repeat CT in 1 year   Orders Placed This Encounter  Procedures  . CT CHEST WO CONTRAST    Standing Status:   Future    Standing Expiration Date:   06/29/2018    Order Specific Question:   Reason for  Exam (SYMPTOM  OR DIAGNOSIS REQUIRED)    Answer:   lung nodules follow-up    Order Specific Question:   Preferred imaging location?    Answer:   [facility]  . Comprehensive metabolic panel    Standing Status:   Future    Standing Expiration Date:   06/29/2018  . CBC with Differential/Platelet    Standing Status:   Future    Standing Expiration Date:   06/29/2018  . TSH    Standing Status:   Future    Standing Expiration Date:   06/29/2018   All questions were answered. The patient knows to call the clinic with any problems, questions or concerns. No barriers to learning was detected. I spent 10 minutes counseling the patient face to face. The total time spent in the appointment was 15 minutes and more than 50% was on counseling and review of test results     [name], MD 04/29/2017 9:33 AM

## 2017-04-29 NOTE — Assessment & Plan Note (Signed)
Per radiologist guideline, will repeat CT in 1 year

## 2017-04-29 NOTE — Assessment & Plan Note (Signed)
Examination is satisfactory without signs of disease recurrence CT is negative. I will see her back in 1 year with history, physical examination and blood work. I recommend ENT follow-up for nasopharyngeal evaluation with endoscopy.

## 2017-04-29 NOTE — Telephone Encounter (Signed)
Appointments scheduled per 5.9.18 LOS. Patient given AVS report and calendars with future scheduled appointments. °

## 2017-05-26 DIAGNOSIS — H6981 Other specified disorders of Eustachian tube, right ear: Secondary | ICD-10-CM | POA: Diagnosis not present

## 2017-05-26 DIAGNOSIS — J392 Other diseases of pharynx: Secondary | ICD-10-CM | POA: Diagnosis not present

## 2017-05-26 DIAGNOSIS — H6121 Impacted cerumen, right ear: Secondary | ICD-10-CM | POA: Diagnosis not present

## 2017-05-26 DIAGNOSIS — J310 Chronic rhinitis: Secondary | ICD-10-CM | POA: Diagnosis not present

## 2017-06-04 DIAGNOSIS — M79642 Pain in left hand: Secondary | ICD-10-CM | POA: Diagnosis not present

## 2017-06-04 DIAGNOSIS — M25541 Pain in joints of right hand: Secondary | ICD-10-CM | POA: Diagnosis not present

## 2017-06-04 DIAGNOSIS — M79641 Pain in right hand: Secondary | ICD-10-CM | POA: Diagnosis not present

## 2017-08-03 DIAGNOSIS — R1030 Lower abdominal pain, unspecified: Secondary | ICD-10-CM | POA: Diagnosis not present

## 2017-08-04 DIAGNOSIS — R102 Pelvic and perineal pain: Secondary | ICD-10-CM | POA: Diagnosis not present

## 2017-09-17 DIAGNOSIS — E559 Vitamin D deficiency, unspecified: Secondary | ICD-10-CM | POA: Diagnosis not present

## 2017-09-17 DIAGNOSIS — R739 Hyperglycemia, unspecified: Secondary | ICD-10-CM | POA: Diagnosis not present

## 2017-09-17 DIAGNOSIS — Z136 Encounter for screening for cardiovascular disorders: Secondary | ICD-10-CM | POA: Diagnosis not present

## 2017-09-17 DIAGNOSIS — Z0000 Encounter for general adult medical examination without abnormal findings: Secondary | ICD-10-CM | POA: Diagnosis not present

## 2017-09-24 DIAGNOSIS — Z23 Encounter for immunization: Secondary | ICD-10-CM | POA: Diagnosis not present

## 2017-09-24 DIAGNOSIS — Z0000 Encounter for general adult medical examination without abnormal findings: Secondary | ICD-10-CM | POA: Diagnosis not present

## 2017-09-24 DIAGNOSIS — E559 Vitamin D deficiency, unspecified: Secondary | ICD-10-CM | POA: Diagnosis not present

## 2017-09-30 ENCOUNTER — Encounter: Payer: Self-pay | Admitting: Hematology and Oncology

## 2017-10-08 DIAGNOSIS — H40033 Anatomical narrow angle, bilateral: Secondary | ICD-10-CM | POA: Diagnosis not present

## 2017-10-08 DIAGNOSIS — H04123 Dry eye syndrome of bilateral lacrimal glands: Secondary | ICD-10-CM | POA: Diagnosis not present

## 2017-11-24 DIAGNOSIS — M25571 Pain in right ankle and joints of right foot: Secondary | ICD-10-CM | POA: Diagnosis not present

## 2017-12-16 DIAGNOSIS — J343 Hypertrophy of nasal turbinates: Secondary | ICD-10-CM | POA: Diagnosis not present

## 2017-12-16 DIAGNOSIS — J069 Acute upper respiratory infection, unspecified: Secondary | ICD-10-CM | POA: Diagnosis not present

## 2017-12-16 DIAGNOSIS — J310 Chronic rhinitis: Secondary | ICD-10-CM | POA: Diagnosis not present

## 2017-12-16 DIAGNOSIS — J392 Other diseases of pharynx: Secondary | ICD-10-CM | POA: Diagnosis not present

## 2017-12-16 DIAGNOSIS — H6983 Other specified disorders of Eustachian tube, bilateral: Secondary | ICD-10-CM | POA: Diagnosis not present

## 2017-12-31 DIAGNOSIS — Z1231 Encounter for screening mammogram for malignant neoplasm of breast: Secondary | ICD-10-CM | POA: Diagnosis not present

## 2017-12-31 DIAGNOSIS — Z6823 Body mass index (BMI) 23.0-23.9, adult: Secondary | ICD-10-CM | POA: Diagnosis not present

## 2017-12-31 DIAGNOSIS — Z01419 Encounter for gynecological examination (general) (routine) without abnormal findings: Secondary | ICD-10-CM | POA: Diagnosis not present

## 2018-01-19 DIAGNOSIS — M25572 Pain in left ankle and joints of left foot: Secondary | ICD-10-CM | POA: Diagnosis not present

## 2018-01-19 DIAGNOSIS — M25472 Effusion, left ankle: Secondary | ICD-10-CM | POA: Diagnosis not present

## 2018-01-26 DIAGNOSIS — M25472 Effusion, left ankle: Secondary | ICD-10-CM | POA: Diagnosis not present

## 2018-01-26 DIAGNOSIS — D8989 Other specified disorders involving the immune mechanism, not elsewhere classified: Secondary | ICD-10-CM | POA: Diagnosis not present

## 2018-01-26 DIAGNOSIS — M109 Gout, unspecified: Secondary | ICD-10-CM | POA: Diagnosis not present

## 2018-01-26 DIAGNOSIS — M25572 Pain in left ankle and joints of left foot: Secondary | ICD-10-CM | POA: Diagnosis not present

## 2018-01-26 DIAGNOSIS — I7300 Raynaud's syndrome without gangrene: Secondary | ICD-10-CM | POA: Diagnosis not present

## 2018-02-09 DIAGNOSIS — M25572 Pain in left ankle and joints of left foot: Secondary | ICD-10-CM | POA: Diagnosis not present

## 2018-02-09 DIAGNOSIS — M25472 Effusion, left ankle: Secondary | ICD-10-CM | POA: Diagnosis not present

## 2018-02-09 DIAGNOSIS — R768 Other specified abnormal immunological findings in serum: Secondary | ICD-10-CM | POA: Diagnosis not present

## 2018-02-09 DIAGNOSIS — D72819 Decreased white blood cell count, unspecified: Secondary | ICD-10-CM | POA: Diagnosis not present

## 2018-02-09 DIAGNOSIS — I7300 Raynaud's syndrome without gangrene: Secondary | ICD-10-CM | POA: Diagnosis not present

## 2018-02-24 ENCOUNTER — Telehealth: Payer: Self-pay

## 2018-02-24 DIAGNOSIS — M76822 Posterior tibial tendinitis, left leg: Secondary | ICD-10-CM | POA: Diagnosis not present

## 2018-02-24 NOTE — Telephone Encounter (Signed)
She called and left message. Her orthopedic MD wanted her to call and ask if it would be okay to take ibuprofen for her left foot tendonitis.

## 2018-02-25 NOTE — Telephone Encounter (Signed)
Yes, OK 

## 2018-02-25 NOTE — Telephone Encounter (Signed)
Called and given below message. Verbalized understanding. 

## 2018-03-11 DIAGNOSIS — M76822 Posterior tibial tendinitis, left leg: Secondary | ICD-10-CM | POA: Diagnosis not present

## 2018-03-31 DIAGNOSIS — M76822 Posterior tibial tendinitis, left leg: Secondary | ICD-10-CM | POA: Diagnosis not present

## 2018-04-13 DIAGNOSIS — Z8371 Family history of colonic polyps: Secondary | ICD-10-CM | POA: Diagnosis not present

## 2018-04-13 DIAGNOSIS — Z800 Family history of malignant neoplasm of digestive organs: Secondary | ICD-10-CM | POA: Diagnosis not present

## 2018-04-13 DIAGNOSIS — Z1211 Encounter for screening for malignant neoplasm of colon: Secondary | ICD-10-CM | POA: Diagnosis not present

## 2018-04-28 ENCOUNTER — Ambulatory Visit (HOSPITAL_COMMUNITY)
Admission: RE | Admit: 2018-04-28 | Discharge: 2018-04-28 | Disposition: A | Discharge status: Home / Self Care | Payer: Medicare Other | Source: Ambulatory Visit | Attending: Hematology and Oncology | Admitting: Hematology and Oncology

## 2018-04-28 ENCOUNTER — Inpatient Hospital Stay: Payer: Medicare Other | Attending: Hematology and Oncology

## 2018-04-28 DIAGNOSIS — Z8572 Personal history of non-Hodgkin lymphomas: Secondary | ICD-10-CM | POA: Diagnosis not present

## 2018-04-28 DIAGNOSIS — M5134 Other intervertebral disc degeneration, thoracic region: Secondary | ICD-10-CM | POA: Diagnosis not present

## 2018-04-28 DIAGNOSIS — R918 Other nonspecific abnormal finding of lung field: Secondary | ICD-10-CM | POA: Insufficient documentation

## 2018-04-28 DIAGNOSIS — I700 Atherosclerosis of aorta: Secondary | ICD-10-CM | POA: Diagnosis not present

## 2018-04-28 DIAGNOSIS — Z923 Personal history of irradiation: Secondary | ICD-10-CM | POA: Insufficient documentation

## 2018-04-28 DIAGNOSIS — Z7982 Long term (current) use of aspirin: Secondary | ICD-10-CM | POA: Diagnosis not present

## 2018-04-28 DIAGNOSIS — Z9221 Personal history of antineoplastic chemotherapy: Secondary | ICD-10-CM | POA: Diagnosis not present

## 2018-04-28 DIAGNOSIS — Z79899 Other long term (current) drug therapy: Secondary | ICD-10-CM | POA: Insufficient documentation

## 2018-04-28 DIAGNOSIS — D72829 Elevated white blood cell count, unspecified: Secondary | ICD-10-CM | POA: Diagnosis not present

## 2018-04-28 DIAGNOSIS — I2510 Atherosclerotic heart disease of native coronary artery without angina pectoris: Secondary | ICD-10-CM | POA: Insufficient documentation

## 2018-04-28 LAB — CBC WITH DIFFERENTIAL/PLATELET
Basophils Absolute: 0 10*3/uL (ref 0.0–0.1)
Basophils Relative: 1 %
Eosinophils Absolute: 0.2 10*3/uL (ref 0.0–0.5)
Eosinophils Relative: 9 %
HCT: 36.1 % (ref 34.8–46.6)
Hemoglobin: 12 g/dL (ref 11.6–15.9)
Lymphocytes Relative: 41 %
Lymphs Abs: 0.9 10*3/uL (ref 0.9–3.3)
MCH: 29.4 pg (ref 25.1–34.0)
MCHC: 33.3 g/dL (ref 31.5–36.0)
MCV: 88.3 fL (ref 79.5–101.0)
Monocytes Absolute: 0.2 10*3/uL (ref 0.1–0.9)
Monocytes Relative: 10 %
Neutro Abs: 0.9 10*3/uL — ABNORMAL LOW (ref 1.5–6.5)
Neutrophils Relative %: 39 %
Platelets: 200 10*3/uL (ref 145–400)
RBC: 4.09 MIL/uL (ref 3.70–5.45)
RDW: 13.8 % (ref 11.2–14.5)
WBC: 2.2 10*3/uL — ABNORMAL LOW (ref 3.9–10.3)

## 2018-04-28 LAB — COMPREHENSIVE METABOLIC PANEL
ALT: 15 U/L (ref 0–55)
AST: 22 U/L (ref 5–34)
Albumin: 4.2 g/dL (ref 3.5–5.0)
Alkaline Phosphatase: 64 U/L (ref 40–150)
Anion gap: 7 (ref 3–11)
BUN: 4 mg/dL — ABNORMAL LOW (ref 7–26)
CO2: 31 mmol/L — ABNORMAL HIGH (ref 22–29)
Calcium: 9.9 mg/dL (ref 8.4–10.4)
Chloride: 99 mmol/L (ref 98–109)
Creatinine, Ser: 0.81 mg/dL (ref 0.60–1.10)
GFR calc Af Amer: >60 mL/min (ref >60)
GFR calc non Af Amer: >60 mL/min (ref >60)
Glucose, Bld: 76 mg/dL (ref 70–140)
Potassium: 4.7 mmol/L (ref 3.5–5.1)
Sodium: 137 mmol/L (ref 136–145)
Total Bilirubin: 0.5 mg/dL (ref 0.2–1.2)
Total Protein: 7 g/dL (ref 6.4–8.3)

## 2018-04-28 LAB — TSH: TSH: 2.122 u[IU]/mL (ref 0.308–3.960)

## 2018-04-29 ENCOUNTER — Telehealth: Payer: Self-pay | Admitting: Hematology and Oncology

## 2018-04-29 ENCOUNTER — Inpatient Hospital Stay (HOSPITAL_BASED_OUTPATIENT_CLINIC_OR_DEPARTMENT_OTHER): Payer: Medicare Other | Admitting: Hematology and Oncology

## 2018-04-29 ENCOUNTER — Encounter: Payer: Self-pay | Admitting: Hematology and Oncology

## 2018-04-29 DIAGNOSIS — T451X5A Adverse effect of antineoplastic and immunosuppressive drugs, initial encounter: Secondary | ICD-10-CM

## 2018-04-29 DIAGNOSIS — Z923 Personal history of irradiation: Secondary | ICD-10-CM

## 2018-04-29 DIAGNOSIS — Z79899 Other long term (current) drug therapy: Secondary | ICD-10-CM | POA: Diagnosis not present

## 2018-04-29 DIAGNOSIS — I700 Atherosclerosis of aorta: Secondary | ICD-10-CM

## 2018-04-29 DIAGNOSIS — D72829 Elevated white blood cell count, unspecified: Secondary | ICD-10-CM

## 2018-04-29 DIAGNOSIS — R5383 Other fatigue: Secondary | ICD-10-CM | POA: Insufficient documentation

## 2018-04-29 DIAGNOSIS — Z9221 Personal history of antineoplastic chemotherapy: Secondary | ICD-10-CM

## 2018-04-29 DIAGNOSIS — Z7982 Long term (current) use of aspirin: Secondary | ICD-10-CM

## 2018-04-29 DIAGNOSIS — R918 Other nonspecific abnormal finding of lung field: Secondary | ICD-10-CM | POA: Diagnosis not present

## 2018-04-29 DIAGNOSIS — M5134 Other intervertebral disc degeneration, thoracic region: Secondary | ICD-10-CM | POA: Diagnosis not present

## 2018-04-29 DIAGNOSIS — Z8572 Personal history of non-Hodgkin lymphomas: Secondary | ICD-10-CM | POA: Diagnosis not present

## 2018-04-29 DIAGNOSIS — D701 Agranulocytosis secondary to cancer chemotherapy: Secondary | ICD-10-CM

## 2018-04-29 NOTE — Progress Notes (Signed)
[name]  Patient Care Team: Tabitha Gravel, MD as PCP - General (Internal Medicine) [name], MD as Consulting Physician (Hematology and Oncology) [name], MD as Consulting Physician (Otolaryngology)  ASSESSMENT & PLAN:  History of B-cell lymphoma Examination is satisfactory without signs of disease recurrence CT is negative. I will see her back in 1 year with history, physical examination and blood work.  Multiple lung nodules on CT Per radiologist guideline, will repeat CT in 2 years, due in 2021  Leukopenia due to antineoplastic chemotherapy She has chronic leukopenia, likely induced by prior chemotherapy. She is not symptomatic. Recommend observation only   No orders of the defined types were placed in this encounter.   INTERVAL HISTORY: Please see below for problem oriented charting. She returns with her husband for further follow-up She is doing well Denies cough, chest pain or shortness of breath No recent infection No new lymphadenopathy She is active and exercise 6 days a week  SUMMARY OF ONCOLOGIC HISTORY:   History of B-cell lymphoma   01/31/2015 Imaging    CT scan of the neck showed large mass centered around the right parapharyngeal space, measuring approximately 3.6 cm with bulky lymphadenopathy on the right side. Incidentally 2 nodules were found.      02/05/2015 Pathology Results    Accession: [id number] biopsy confirmed diffuse large B-cell lymphoma      02/05/2015 Surgery    She had excisional lymph node biopsy of the right neck mass      02/16/2015 Imaging     echocardiogram showed preserved ejection fraction.      02/19/2015 Bone Marrow Biopsy    Accession: [id number] BM biopsy was negative for cancer      02/21/2015 Imaging     she had PET CT scan which showed stage II disease.      02/26/2015 Procedure     she had placement of Infuse-a-Port.      03/01/2015 - 04/20/2015 Chemotherapy     she received 3  cycles of R-CHOP chemotherapy with 2 cycles of IT methotrexate      03/22/2015 Adverse Reaction    Cycle 2 of RCHOP chemotherapy is delayed due to neutropenia       05/10/2015 Imaging    PET scan showed positive response to treatment      06/11/2015 - 07/09/2015 Radiation Therapy    Helical IMRT Tomotherapy to R neck:  40 Gy at 2 Gy per fraction x 20 fractions.      10/08/2015 Imaging    PET scan showed no evidence of disease recurrence      10/18/2015 Procedure    Port-a-cath removed.      04/07/2016 PET scan    Mild increase in FDG uptake associated with the right hilum. The degree of uptake is nonspecific currently 4.08. This area warrants attention on follow-up imaging.      08/07/2016 Imaging    CT scan similar right-sided ground-glass and ill-defined pulmonary nodules      04/28/2017 Imaging    CT chest: 1. Stable appearance ground-glass nodules right upper lobe measuring up to 7 mm. 2. 7 mm sub solid nodule superior segment right lower lobe is stable. 3. Additional follow-up with CT at 12 months is recommended. If persistent, repeat CT is recommended every 2 years until 5 years of stability has been established.       04/28/2017 Imaging    CT neck: 1. Subcentimeter lymph nodes in the upper neck without pathologic  enlargement to suggest residual or recurrent disease. 2. Stranding of the right parapharyngeal fat likely reflects posttreatment change without residual or recurrent disease. 3. Mild degenerative changes in the upper cervical spine.      04/28/2018 Imaging    1. Stable appearance of sub solid nodules measuring up to 7 mm. Repeat CT is recommended every 2 years until 5 years of stability has been established. This recommendation follows theconsensus statement: Guidelines for Management of Incidental pulmonary Nodules Detected on CT Images: From the Fleischner Society 2017; Radiology 2017; 284:228-243. 2. Unchanged appearance of solid subpleural nodule in the posterior  right upper lobe and tiny left upper lobe lung nodules, likely representing a benign abnormality or no further follow-up. 3. Aortic Atherosclerosis (ICD10-I70.0). LAD and left main coronary artery atherosclerotic calcifications.       REVIEW OF SYSTEMS:   Constitutional: Denies fevers, chills or abnormal weight loss Eyes: Denies blurriness of vision Ears, nose, mouth, throat, and face: Denies mucositis or sore throat Respiratory: Denies cough, dyspnea or wheezes Cardiovascular: Denies palpitation, chest discomfort or lower extremity swelling Gastrointestinal:  Denies nausea, heartburn or change in bowel habits Skin: Denies abnormal skin rashes Lymphatics: Denies new lymphadenopathy or easy bruising Neurological:Denies numbness, tingling or new weaknesses Behavioral/Psych: Mood is stable, no new changes  All other systems were reviewed with the patient and are negative.  I have reviewed the past medical history, past surgical history, social history and family history with the patient and they are unchanged from previous note.  ALLERGIES:  is allergic to erythromycin; hydrocodone; peanut-containing drug products; and biaxin [clarithromycin].  MEDICATIONS:  Current Outpatient Medications  Medication Sig Dispense Refill  . aspirin EC 81 MG tablet Take 81 mg by mouth daily.    . cholecalciferol (VITAMIN D) 1000 units tablet Take 1,000 Units by mouth daily.    . hydroxypropyl methylcellulose / hypromellose (ISOPTO TEARS / GONIOVISC) 2.5 % ophthalmic solution Place 2-3 drops into both eyes 3 (three) times daily as needed for dry eyes.    . Iron Combinations (IRON COMPLEX PO) Take 1 tablet by mouth daily. Over the counter    . vitamin B-12 (CYANOCOBALAMIN) 1000 MCG tablet Take 1,000 mcg by mouth daily.     No current facility-administered medications for this visit.     PHYSICAL EXAMINATION: ECOG PERFORMANCE STATUS: 1 - Symptomatic but completely ambulatory  Vitals:   04/29/18 0935   BP: 107/63  Pulse: (!) 53  Resp: 18  Temp: (!) 97.5 F (36.4 C)  SpO2: 95%   Filed Weights   04/29/18 0935  Weight: 138 lb 6.4 oz (62.8 kg)    GENERAL:alert, no distress and comfortable SKIN: skin color, texture, turgor are normal, no rashes or significant lesions EYES: normal, Conjunctiva are pink and non-injected, sclera clear OROPHARYNX:no exudate, no erythema and lips, buccal mucosa, and tongue normal  NECK: supple, thyroid normal size, non-tender, without nodularity LYMPH:  no palpable lymphadenopathy in the cervical, axillary or inguinal LUNGS: clear to auscultation and percussion with normal breathing effort HEART: regular rate & rhythm and no murmurs and no lower extremity edema ABDOMEN:abdomen soft, non-tender and normal bowel sounds Musculoskeletal:no cyanosis of digits and no clubbing  NEURO: alert & oriented x 3 with fluent speech, no focal motor/sensory deficits  LABORATORY DATA:  I have reviewed the data as listed    Component Value Date/Time   NA 137 04/28/2018 0915   NA 141 04/15/2017 0818   K 4.7 04/28/2018 0915   K 4.8 04/15/2017 0818  CL 99 04/28/2018 0915   CO2 31 (H) 04/28/2018 0915   CO2 29 04/15/2017 0818   GLUCOSE 76 04/28/2018 0915   GLUCOSE 96 04/15/2017 0818   BUN 4 (L) 04/28/2018 0915   BUN 6.8 (L) 04/15/2017 0818   CREATININE 0.81 04/28/2018 0915   CREATININE 0.8 04/15/2017 0818   CALCIUM 9.9 04/28/2018 0915   CALCIUM 9.9 04/15/2017 0818   PROT 7.0 04/28/2018 0915   PROT 7.0 04/15/2017 0818   ALBUMIN 4.2 04/28/2018 0915   ALBUMIN 4.1 04/15/2017 0818   AST 22 04/28/2018 0915   AST 20 04/15/2017 0818   ALT 15 04/28/2018 0915   ALT 15 04/15/2017 0818   ALKPHOS 64 04/28/2018 0915   ALKPHOS 60 04/15/2017 0818   BILITOT 0.5 04/28/2018 0915   BILITOT 0.65 04/15/2017 0818   GFRNONAA >60 04/28/2018 0915   GFRAA >60 04/28/2018 0915    No results found for: SPEP, UPEP  Lab Results  Component Value Date   WBC 2.2 (L) 04/28/2018    NEUTROABS 0.9 (L) 04/28/2018   HGB 12.0 04/28/2018   HCT 36.1 04/28/2018   MCV 88.3 04/28/2018   PLT 200 04/28/2018      Chemistry      Component Value Date/Time   NA 137 04/28/2018 0915   NA 141 04/15/2017 0818   K 4.7 04/28/2018 0915   K 4.8 04/15/2017 0818   CL 99 04/28/2018 0915   CO2 31 (H) 04/28/2018 0915   CO2 29 04/15/2017 0818   BUN 4 (L) 04/28/2018 0915   BUN 6.8 (L) 04/15/2017 0818   CREATININE 0.81 04/28/2018 0915   CREATININE 0.8 04/15/2017 0818      Component Value Date/Time   CALCIUM 9.9 04/28/2018 0915   CALCIUM 9.9 04/15/2017 0818   ALKPHOS 64 04/28/2018 0915   ALKPHOS 60 04/15/2017 0818   AST 22 04/28/2018 0915   AST 20 04/15/2017 0818   ALT 15 04/28/2018 0915   ALT 15 04/15/2017 0818   BILITOT 0.5 04/28/2018 0915   BILITOT 0.65 04/15/2017 0818       RADIOGRAPHIC STUDIES: I have personally reviewed the radiological images as listed and agreed with the findings in the report. Ct Chest Wo Contrast  Result Date: 04/28/2018 CLINICAL DATA:  Followup pulmonary nodules EXAM: CT CHEST WITHOUT CONTRAST TECHNIQUE: Multidetector CT imaging of the chest was performed following the standard protocol without IV contrast. COMPARISON:  04/28/2017 FINDINGS: Cardiovascular: No pericardial effusion. Mild aortic atherosclerosis. Calcifications in the left main, lad coronary arteries noted. Mediastinum/Nodes: Normal appearance of the thyroid gland. The trachea appears patent and is midline. Normal appearance of the esophagus. No enlarged mediastinal or hilar lymph nodes. Lungs/Pleura: Small broad-based subpleural nodule overlying the posterior right upper lobe measures 7 mm and is unchanged from previous exam, image 25/5. There are 2 ground-glass nodules identified within the anterior right upper lobe measuring 4 and 7 mm, image 54/5. Stable from previous exam. Sub solid nodule within the superior segment of right lower lobe measures 7 mm, image 50/5. Stable. Ground-glass  attenuating nodule in the right lower lobe is unchanged measuring 6 mm, image 73/5. Stable tiny left upper lobe lung nodule measuring 2-3 mm, image 45/5. Upper Abdomen: No acute abnormality. Musculoskeletal: Mild thoracic degenerative disc disease. No suspicious bone lesions. IMPRESSION: 1. Stable appearance of sub solid nodules measuring up to 7 mm. Repeat CT is recommended every 2 years until 5 years of stability has been established. This recommendation follows the consensus statement: Guidelines for Management of  Incidental pulmonary Nodules Detected on CT Images: From the Fleischner Society 2017; Radiology 2017; 284:228-243. 2. Unchanged appearance of solid subpleural nodule in the posterior right upper lobe and tiny left upper lobe lung nodules, likely representing a benign abnormality or no further follow-up. 3. Aortic Atherosclerosis (ICD10-I70.0). LAD and left main coronary artery atherosclerotic calcifications. Electronically Signed   By: Kerby Moors M.D.   On: 04/28/2018 15:58    All questions were answered. The patient knows to call the clinic with any problems, questions or concerns. No barriers to learning was detected.  I spent 15 minutes counseling the patient face to face. The total time spent in the appointment was 20 minutes and more than 50% was on counseling and review of test results  [name], MD 04/29/2018 11:21 AM

## 2018-04-29 NOTE — Assessment & Plan Note (Signed)
Per radiologist guideline, will repeat CT in 2 years, due in 2021

## 2018-04-29 NOTE — Assessment & Plan Note (Signed)
She has chronic leukopenia, likely induced by prior chemotherapy. She is not symptomatic. Recommend observation only

## 2018-04-29 NOTE — Assessment & Plan Note (Signed)
Examination is satisfactory without signs of disease recurrence CT is negative. I will see her back in 1 year with history, physical examination and blood work.

## 2018-04-29 NOTE — Telephone Encounter (Signed)
Gave avs and calendar ° °

## 2018-05-10 DIAGNOSIS — C8590 Non-Hodgkin lymphoma, unspecified, unspecified site: Secondary | ICD-10-CM | POA: Diagnosis not present

## 2018-05-10 DIAGNOSIS — R21 Rash and other nonspecific skin eruption: Secondary | ICD-10-CM | POA: Diagnosis not present

## 2018-05-12 ENCOUNTER — Telehealth: Payer: Self-pay

## 2018-05-12 NOTE — Telephone Encounter (Signed)
Received voicemail from patient requesting call back stating "it's an emergency.". Upon call back, patient disclosed that she woke up with a rash at the top of her anus Monday morning. Patient was able to be seen at her PCP's office, on Monday, where blood test were drawn and patient was prescribed "a cream". Patient received a call back from PCP's office today and was told that test revealed presence of "Herpes type 2". Patient currently taking valacyclovir twice a day. Patient questions " Will this effect what I've been through? Am I still ok?" Patient says she's not sure what her question is, she just wanted to be sure that Dr. Alvy Bimler is aware incase she needs to do something different. Patient expressed being in shock about diagnosis and unsure of how the diagnosis could have came about. Please follow-up with patient if indicated.

## 2018-05-13 ENCOUNTER — Telehealth: Payer: Self-pay

## 2018-05-13 NOTE — Telephone Encounter (Signed)
Spoke with pt by phone regarding her msg of recent dx of herpes type 2.  Per Dr Alvy Bimler, informed pt that this is not r/t her previous cancer or treatment and that her PCP prescribing Valtrex is appropriate treatment.  Offered ID consult.  Pt declined.  Unfortunately call was disconnected.  Will attempt to return call.

## 2018-05-13 NOTE — Telephone Encounter (Signed)
She is off chemo for a long time. This is unrelated to her disease/prior treatment If her primary doctor unable to address her concerns I suggest ID referral

## 2018-05-19 DIAGNOSIS — C8590 Non-Hodgkin lymphoma, unspecified, unspecified site: Secondary | ICD-10-CM | POA: Diagnosis not present

## 2018-05-27 DIAGNOSIS — R21 Rash and other nonspecific skin eruption: Secondary | ICD-10-CM | POA: Diagnosis not present

## 2018-05-31 DIAGNOSIS — B86 Scabies: Secondary | ICD-10-CM | POA: Diagnosis not present

## 2018-06-23 DIAGNOSIS — M25572 Pain in left ankle and joints of left foot: Secondary | ICD-10-CM | POA: Diagnosis not present

## 2018-07-27 DIAGNOSIS — J310 Chronic rhinitis: Secondary | ICD-10-CM | POA: Diagnosis not present

## 2018-07-27 DIAGNOSIS — H6983 Other specified disorders of Eustachian tube, bilateral: Secondary | ICD-10-CM | POA: Diagnosis not present

## 2018-09-03 DIAGNOSIS — Z23 Encounter for immunization: Secondary | ICD-10-CM | POA: Diagnosis not present

## 2018-09-15 DIAGNOSIS — E559 Vitamin D deficiency, unspecified: Secondary | ICD-10-CM | POA: Diagnosis not present

## 2018-09-15 DIAGNOSIS — B86 Scabies: Secondary | ICD-10-CM | POA: Diagnosis not present

## 2018-09-15 DIAGNOSIS — C8590 Non-Hodgkin lymphoma, unspecified, unspecified site: Secondary | ICD-10-CM | POA: Diagnosis not present

## 2018-09-15 DIAGNOSIS — Z0000 Encounter for general adult medical examination without abnormal findings: Secondary | ICD-10-CM | POA: Diagnosis not present

## 2018-09-25 DIAGNOSIS — H04123 Dry eye syndrome of bilateral lacrimal glands: Secondary | ICD-10-CM | POA: Diagnosis not present

## 2018-09-25 DIAGNOSIS — H40033 Anatomical narrow angle, bilateral: Secondary | ICD-10-CM | POA: Diagnosis not present

## 2018-09-29 DIAGNOSIS — M25571 Pain in right ankle and joints of right foot: Secondary | ICD-10-CM | POA: Diagnosis not present

## 2018-09-29 DIAGNOSIS — R1030 Lower abdominal pain, unspecified: Secondary | ICD-10-CM | POA: Diagnosis not present

## 2018-09-29 DIAGNOSIS — C8590 Non-Hodgkin lymphoma, unspecified, unspecified site: Secondary | ICD-10-CM | POA: Diagnosis not present

## 2018-09-29 DIAGNOSIS — Z23 Encounter for immunization: Secondary | ICD-10-CM | POA: Diagnosis not present

## 2018-09-29 DIAGNOSIS — L739 Follicular disorder, unspecified: Secondary | ICD-10-CM | POA: Diagnosis not present

## 2018-11-03 DIAGNOSIS — M25572 Pain in left ankle and joints of left foot: Secondary | ICD-10-CM | POA: Diagnosis not present

## 2018-11-05 DIAGNOSIS — H43393 Other vitreous opacities, bilateral: Secondary | ICD-10-CM | POA: Diagnosis not present

## 2018-11-23 ENCOUNTER — Telehealth: Payer: Self-pay

## 2018-11-23 NOTE — Telephone Encounter (Signed)
She called and left a message to call her.  Called back. She is requesting appt  with Dr. Alvy Bimler to follow up in the next 2 weeks.

## 2018-11-24 ENCOUNTER — Telehealth: Payer: Self-pay | Admitting: Hematology and Oncology

## 2018-11-24 NOTE — Telephone Encounter (Signed)
OK to send scheduling msg for labs and appt in 2 weeks, 30 mins

## 2018-11-24 NOTE — Telephone Encounter (Signed)
Called her back and given below message. She verbalized understanding. Scheduling message sent.

## 2018-11-24 NOTE — Telephone Encounter (Signed)
Scheduled appt per 12/4 sch message- left message for patient and sent reminder letter in the mail

## 2018-12-01 DIAGNOSIS — M25572 Pain in left ankle and joints of left foot: Secondary | ICD-10-CM | POA: Diagnosis not present

## 2018-12-13 ENCOUNTER — Inpatient Hospital Stay: Payer: Medicare Other | Attending: Hematology and Oncology | Admitting: Hematology and Oncology

## 2018-12-13 ENCOUNTER — Encounter: Payer: Self-pay | Admitting: Hematology and Oncology

## 2018-12-13 ENCOUNTER — Inpatient Hospital Stay: Payer: Medicare Other

## 2018-12-13 ENCOUNTER — Telehealth: Payer: Self-pay | Admitting: Hematology and Oncology

## 2018-12-13 DIAGNOSIS — R5383 Other fatigue: Secondary | ICD-10-CM

## 2018-12-13 DIAGNOSIS — Z91410 Personal history of adult physical and sexual abuse: Secondary | ICD-10-CM

## 2018-12-13 DIAGNOSIS — Z8572 Personal history of non-Hodgkin lymphomas: Secondary | ICD-10-CM | POA: Insufficient documentation

## 2018-12-13 DIAGNOSIS — Z635 Disruption of family by separation and divorce: Secondary | ICD-10-CM | POA: Insufficient documentation

## 2018-12-13 DIAGNOSIS — Z79899 Other long term (current) drug therapy: Secondary | ICD-10-CM | POA: Diagnosis not present

## 2018-12-13 DIAGNOSIS — B009 Herpesviral infection, unspecified: Secondary | ICD-10-CM

## 2018-12-13 DIAGNOSIS — D701 Agranulocytosis secondary to cancer chemotherapy: Secondary | ICD-10-CM

## 2018-12-13 DIAGNOSIS — Z7982 Long term (current) use of aspirin: Secondary | ICD-10-CM | POA: Diagnosis not present

## 2018-12-13 DIAGNOSIS — R918 Other nonspecific abnormal finding of lung field: Secondary | ICD-10-CM

## 2018-12-13 DIAGNOSIS — T451X5A Adverse effect of antineoplastic and immunosuppressive drugs, initial encounter: Secondary | ICD-10-CM

## 2018-12-13 DIAGNOSIS — Z923 Personal history of irradiation: Secondary | ICD-10-CM

## 2018-12-13 DIAGNOSIS — Z9221 Personal history of antineoplastic chemotherapy: Secondary | ICD-10-CM | POA: Diagnosis not present

## 2018-12-13 DIAGNOSIS — D72819 Decreased white blood cell count, unspecified: Secondary | ICD-10-CM | POA: Insufficient documentation

## 2018-12-13 LAB — COMPREHENSIVE METABOLIC PANEL
ALT: 19 U/L (ref 0–44)
AST: 23 U/L (ref 15–41)
Albumin: 3.9 g/dL (ref 3.5–5.0)
Alkaline Phosphatase: 64 U/L (ref 38–126)
Anion gap: 9 (ref 5–15)
BUN: 7 mg/dL — ABNORMAL LOW (ref 8–23)
CO2: 28 mmol/L (ref 22–32)
Calcium: 9.2 mg/dL (ref 8.9–10.3)
Chloride: 103 mmol/L (ref 98–111)
Creatinine, Ser: 0.77 mg/dL (ref 0.44–1.00)
GFR calc Af Amer: >60 mL/min (ref >60)
GFR calc non Af Amer: >60 mL/min (ref >60)
Glucose, Bld: 75 mg/dL (ref 70–99)
Potassium: 3.5 mmol/L (ref 3.5–5.1)
Sodium: 140 mmol/L (ref 135–145)
Total Bilirubin: 0.4 mg/dL (ref 0.3–1.2)
Total Protein: 6.8 g/dL (ref 6.5–8.1)

## 2018-12-13 LAB — CBC WITH DIFFERENTIAL/PLATELET
Abs Immature Granulocytes: 0 10*3/uL (ref 0.00–0.07)
Basophils Absolute: 0 10*3/uL (ref 0.0–0.1)
Basophils Relative: 1 %
Eosinophils Absolute: 0.1 10*3/uL (ref 0.0–0.5)
Eosinophils Relative: 5 %
HCT: 37.7 % (ref 36.0–46.0)
Hemoglobin: 12.1 g/dL (ref 12.0–15.0)
Immature Granulocytes: 0 %
Lymphocytes Relative: 36 %
Lymphs Abs: 1 10*3/uL (ref 0.7–4.0)
MCH: 28.7 pg (ref 26.0–34.0)
MCHC: 32.1 g/dL (ref 30.0–36.0)
MCV: 89.5 fL (ref 80.0–100.0)
Monocytes Absolute: 0.4 10*3/uL (ref 0.1–1.0)
Monocytes Relative: 13 %
Neutro Abs: 1.3 10*3/uL — ABNORMAL LOW (ref 1.7–7.7)
Neutrophils Relative %: 45 %
Platelets: 182 10*3/uL (ref 150–400)
RBC: 4.21 MIL/uL (ref 3.87–5.11)
RDW: 13.5 % (ref 11.5–15.5)
WBC: 2.9 10*3/uL — ABNORMAL LOW (ref 4.0–10.5)
nRBC: 0 % (ref 0.0–0.2)

## 2018-12-13 LAB — TSH: TSH: 2.048 u[IU]/mL (ref 0.308–3.960)
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
## 2018-12-13 NOTE — Telephone Encounter (Signed)
Per 12/23 no new orders

## 2018-12-13 NOTE — Assessment & Plan Note (Signed)
Examination is satisfactory without signs of disease recurrence Her last CT is negative. I will see her back in 6 months with history, physical examination and blood work. 

## 2018-12-13 NOTE — Assessment & Plan Note (Signed)
She has chronic leukopenia, likely induced by prior chemotherapy. She is not symptomatic. Recommend observation only The patient is going to be prone to get repeated infection and she was infected by her husband with overt P simplex virus She does not need long-term chronic prophylactic therapy.

## 2018-12-29 DIAGNOSIS — Z8572 Personal history of non-Hodgkin lymphomas: Secondary | ICD-10-CM | POA: Diagnosis not present

## 2018-12-29 DIAGNOSIS — R041 Hemorrhage from throat: Secondary | ICD-10-CM | POA: Diagnosis not present

## 2019-01-28 ENCOUNTER — Other Ambulatory Visit: Payer: Self-pay | Admitting: Hematology and Oncology

## 2019-01-28 ENCOUNTER — Telehealth: Payer: Self-pay | Admitting: *Deleted

## 2019-01-28 ENCOUNTER — Inpatient Hospital Stay: Payer: Medicare Other

## 2019-01-28 ENCOUNTER — Encounter: Payer: Self-pay | Admitting: Hematology and Oncology

## 2019-01-28 ENCOUNTER — Inpatient Hospital Stay: Payer: Medicare Other | Attending: Hematology and Oncology | Admitting: Hematology and Oncology

## 2019-01-28 DIAGNOSIS — Z8572 Personal history of non-Hodgkin lymphomas: Secondary | ICD-10-CM

## 2019-01-28 DIAGNOSIS — Z923 Personal history of irradiation: Secondary | ICD-10-CM | POA: Insufficient documentation

## 2019-01-28 DIAGNOSIS — D72819 Decreased white blood cell count, unspecified: Secondary | ICD-10-CM | POA: Diagnosis not present

## 2019-01-28 DIAGNOSIS — L02225 Furuncle of perineum: Secondary | ICD-10-CM | POA: Insufficient documentation

## 2019-01-28 DIAGNOSIS — Z9221 Personal history of antineoplastic chemotherapy: Secondary | ICD-10-CM | POA: Diagnosis not present

## 2019-01-28 DIAGNOSIS — Z7982 Long term (current) use of aspirin: Secondary | ICD-10-CM | POA: Insufficient documentation

## 2019-01-28 DIAGNOSIS — Z79899 Other long term (current) drug therapy: Secondary | ICD-10-CM

## 2019-01-28 DIAGNOSIS — L739 Follicular disorder, unspecified: Secondary | ICD-10-CM | POA: Insufficient documentation

## 2019-01-28 LAB — CBC WITH DIFFERENTIAL/PLATELET
Abs Immature Granulocytes: 0 10*3/uL (ref 0.00–0.07)
Basophils Absolute: 0 10*3/uL (ref 0.0–0.1)
Basophils Relative: 1 %
Eosinophils Absolute: 0.1 10*3/uL (ref 0.0–0.5)
Eosinophils Relative: 4 %
HCT: 35.7 % — ABNORMAL LOW (ref 36.0–46.0)
Hemoglobin: 11.6 g/dL — ABNORMAL LOW (ref 12.0–15.0)
Immature Granulocytes: 0 %
Lymphocytes Relative: 37 %
Lymphs Abs: 0.9 10*3/uL (ref 0.7–4.0)
MCH: 29.3 pg (ref 26.0–34.0)
MCHC: 32.5 g/dL (ref 30.0–36.0)
MCV: 90.2 fL (ref 80.0–100.0)
Monocytes Absolute: 0.3 10*3/uL (ref 0.1–1.0)
Monocytes Relative: 14 %
Neutro Abs: 1 10*3/uL — ABNORMAL LOW (ref 1.7–7.7)
Neutrophils Relative %: 44 %
Platelets: 195 10*3/uL (ref 150–400)
RBC: 3.96 MIL/uL (ref 3.87–5.11)
RDW: 13.9 % (ref 11.5–15.5)
WBC: 2.4 10*3/uL — ABNORMAL LOW (ref 4.0–10.5)
nRBC: 0 % (ref 0.0–0.2)

## 2019-01-28 LAB — COMPREHENSIVE METABOLIC PANEL
ALT: 12 U/L (ref 0–44)
AST: 21 U/L (ref 15–41)
Albumin: 3.8 g/dL (ref 3.5–5.0)
Alkaline Phosphatase: 63 U/L (ref 38–126)
Anion gap: 10 (ref 5–15)
BUN: 6 mg/dL — ABNORMAL LOW (ref 8–23)
CO2: 29 mmol/L (ref 22–32)
Calcium: 9.3 mg/dL (ref 8.9–10.3)
Chloride: 101 mmol/L (ref 98–111)
Creatinine, Ser: 0.78 mg/dL (ref 0.44–1.00)
GFR calc Af Amer: >60 mL/min (ref >60)
GFR calc non Af Amer: >60 mL/min (ref >60)
Glucose, Bld: 89 mg/dL (ref 70–99)
Potassium: 3.6 mmol/L (ref 3.5–5.1)
Sodium: 140 mmol/L (ref 135–145)
Total Bilirubin: 0.7 mg/dL (ref 0.3–1.2)
Total Protein: 6.7 g/dL (ref 6.5–8.1)

## 2019-01-28 MED ORDER — CEPHALEXIN 500 MG PO CAPS: 500.0000 mg | ORAL_CAPSULE | Freq: Three times a day (TID) | ORAL | 0 refills | Status: DC

## 2019-01-28 NOTE — Progress Notes (Signed)
[facility] OFFICE PROGRESS NOTE  Patient Care Team: Jani Gravel, MD as PCP - General (Internal Medicine) [name], MD as Consulting Physician (Hematology and Oncology) [name], MD as Consulting Physician (Otolaryngology)  ASSESSMENT & PLAN:  Chronic leukopenia She has chronic leukopenia The skin infection is minor but she is at risk We will continue close monitoring  Folliculitis of perineum She has an area of folliculitis on exam It appears to be healing with no surrounding signs of cellulitis I wrote her a prescription of Keflex to take if the folliculitis get worse over the weekend Otherwise, I recommend conservative approach with watchful observation only   No orders of the defined types were placed in this encounter.   INTERVAL HISTORY: Please see below for problem oriented charting. She is in seen urgently per patient request to evaluate a small area in the perineum that appears to be infected over the last 2 days The patient had a breakout of herpes recently She was treated adequately by her primary care doctor 2 days ago, she noted a small area of pimple arising near the pubis mons.  She denies excessive pain.  She is concerned it could be another sign of herpes simplex and requested evaluation She denies fever or chills  SUMMARY OF ONCOLOGIC HISTORY:   History of B-cell lymphoma   01/31/2015 Imaging    CT scan of the neck showed large mass centered around the right parapharyngeal space, measuring approximately 3.6 cm with bulky lymphadenopathy on the right side. Incidentally 2 nodules were found.    02/05/2015 Pathology Results    Accession: [id number] biopsy confirmed diffuse large B-cell lymphoma    02/05/2015 Surgery    She had excisional lymph node biopsy of the right neck mass    02/16/2015 Imaging     echocardiogram showed preserved ejection fraction.    02/19/2015 Bone Marrow Biopsy    Accession: [id number] BM biopsy was negative for cancer    02/21/2015 Imaging     she had PET CT scan which showed stage II disease.    02/26/2015 Procedure     she had placement of Infuse-a-Port.    03/01/2015 - 04/20/2015 Chemotherapy     she received 3 cycles of R-CHOP chemotherapy with 2 cycles of IT methotrexate    03/22/2015 Adverse Reaction    Cycle 2 of RCHOP chemotherapy is delayed due to neutropenia     05/10/2015 Imaging    PET scan showed positive response to treatment    06/11/2015 - 07/09/2015 Radiation Therapy    Helical IMRT Tomotherapy to R neck:  40 Gy at 2 Gy per fraction x 20 fractions.    10/08/2015 Imaging    PET scan showed no evidence of disease recurrence    10/18/2015 Procedure    Port-a-cath removed.    04/07/2016 PET scan    Mild increase in FDG uptake associated with the right hilum. The degree of uptake is nonspecific currently 4.08. This area warrants attention on follow-up imaging.    08/07/2016 Imaging    CT scan similar right-sided ground-glass and ill-defined pulmonary nodules    04/28/2017 Imaging    CT chest: 1. Stable appearance ground-glass nodules right upper lobe measuring up to 7 mm. 2. 7 mm sub solid nodule superior segment right lower lobe is stable. 3. Additional follow-up with CT at 12 months is recommended. If persistent, repeat CT is recommended every 2 years until 5 years of stability has been established.     04/28/2017 Imaging  CT neck: 1. Subcentimeter lymph nodes in the upper neck without pathologic enlargement to suggest residual or recurrent disease. 2. Stranding of the right parapharyngeal fat likely reflects posttreatment change without residual or recurrent disease. 3. Mild degenerative changes in the upper cervical spine.    04/28/2018 Imaging    1. Stable appearance of sub solid nodules measuring up to 7 mm. Repeat CT is recommended every 2 years until 5 years of stability has been established. This recommendation follows theconsensus statement: Guidelines for Management of Incidental  pulmonary Nodules Detected on CT Images: From the Fleischner Society 2017; Radiology 2017; 284:228-243. 2. Unchanged appearance of solid subpleural nodule in the posterior right upper lobe and tiny left upper lobe lung nodules, likely representing a benign abnormality or no further follow-up. 3. Aortic Atherosclerosis (ICD10-I70.0). LAD and left main coronary artery atherosclerotic calcifications.     REVIEW OF SYSTEMS:   Constitutional: Denies fevers, chills or abnormal weight loss Eyes: Denies blurriness of vision Ears, nose, mouth, throat, and face: Denies mucositis or sore throat Respiratory: Denies cough, dyspnea or wheezes Cardiovascular: Denies palpitation, chest discomfort or lower extremity swelling Gastrointestinal:  Denies nausea, heartburn or change in bowel habits Lymphatics: Denies new lymphadenopathy or easy bruising Neurological:Denies numbness, tingling or new weaknesses Behavioral/Psych: Mood is stable, no new changes  All other systems were reviewed with the patient and are negative.  I have reviewed the past medical history, past surgical history, social history and family history with the patient and they are unchanged from previous note.  ALLERGIES:  is allergic to erythromycin; hydrocodone; peanut-containing drug products; and biaxin [clarithromycin].  MEDICATIONS:  Current Outpatient Medications  Medication Sig Dispense Refill  . aspirin EC 81 MG tablet Take 81 mg by mouth daily.    . cephALEXin (KEFLEX) 500 MG capsule Take 1 capsule (500 mg total) by mouth 3 (three) times daily. 21 capsule 0  . cholecalciferol (VITAMIN D) 1000 units tablet Take 1,000 Units by mouth daily.    . hydroxypropyl methylcellulose / hypromellose (ISOPTO TEARS / GONIOVISC) 2.5 % ophthalmic solution Place 2-3 drops into both eyes 3 (three) times daily as needed for dry eyes.    . Iron Combinations (IRON COMPLEX PO) Take 1 tablet by mouth daily. Over the counter    . vitamin B-12  (CYANOCOBALAMIN) 1000 MCG tablet Take 1,000 mcg by mouth daily.     No current facility-administered medications for this visit.     PHYSICAL EXAMINATION: ECOG PERFORMANCE STATUS: 0 - Asymptomatic  Vitals:   01/28/19 1339  BP: 125/82  Pulse: (!) 52  Resp: 18  Temp: 97.7 F (36.5 C)  SpO2: 100%   Filed Weights   01/28/19 1339  Weight: 141 lb 9.6 oz (64.2 kg)    GENERAL:alert, no distress and comfortable SKIN: Examination of her perineum area revealed a small area of folliculitis in the process of healing with no surrounding cellulitis NEURO: alert & oriented x 3 with fluent speech, no focal motor/sensory deficits  LABORATORY DATA:  I have reviewed the data as listed    Component Value Date/Time   NA 140 01/28/2019 1313   NA 141 04/15/2017 0818   K 3.6 01/28/2019 1313   K 4.8 04/15/2017 0818   CL 101 01/28/2019 1313   CO2 29 01/28/2019 1313   CO2 29 04/15/2017 0818   GLUCOSE 89 01/28/2019 1313   GLUCOSE 96 04/15/2017 0818   BUN 6 (L) 01/28/2019 1313   BUN 6.8 (L) 04/15/2017 0818   CREATININE 0.78 01/28/2019 1313  CREATININE 0.8 04/15/2017 0818   CALCIUM 9.3 01/28/2019 1313   CALCIUM 9.9 04/15/2017 0818   PROT 6.7 01/28/2019 1313   PROT 7.0 04/15/2017 0818   ALBUMIN 3.8 01/28/2019 1313   ALBUMIN 4.1 04/15/2017 0818   AST 21 01/28/2019 1313   AST 20 04/15/2017 0818   ALT 12 01/28/2019 1313   ALT 15 04/15/2017 0818   ALKPHOS 63 01/28/2019 1313   ALKPHOS 60 04/15/2017 0818   BILITOT 0.7 01/28/2019 1313   BILITOT 0.65 04/15/2017 0818   GFRNONAA >60 01/28/2019 1313   GFRAA >60 01/28/2019 1313    No results found for: SPEP, UPEP  Lab Results  Component Value Date   WBC 2.4 (L) 01/28/2019   NEUTROABS 1.0 (L) 01/28/2019   HGB 11.6 (L) 01/28/2019   HCT 35.7 (L) 01/28/2019   MCV 90.2 01/28/2019   PLT 195 01/28/2019      Chemistry      Component Value Date/Time   NA 140 01/28/2019 1313   NA 141 04/15/2017 0818   K 3.6 01/28/2019 1313   K 4.8  04/15/2017 0818   CL 101 01/28/2019 1313   CO2 29 01/28/2019 1313   CO2 29 04/15/2017 0818   BUN 6 (L) 01/28/2019 1313   BUN 6.8 (L) 04/15/2017 0818   CREATININE 0.78 01/28/2019 1313   CREATININE 0.8 04/15/2017 0818      Component Value Date/Time   CALCIUM 9.3 01/28/2019 1313   CALCIUM 9.9 04/15/2017 0818   ALKPHOS 63 01/28/2019 1313   ALKPHOS 60 04/15/2017 0818   AST 21 01/28/2019 1313   AST 20 04/15/2017 0818   ALT 12 01/28/2019 1313   ALT 15 04/15/2017 0818   BILITOT 0.7 01/28/2019 1313   BILITOT 0.65 04/15/2017 0818     All questions were answered. The patient knows to call the clinic with any problems, questions or concerns. No barriers to learning was detected.  I spent 10 minutes counseling the patient face to face. The total time spent in the appointment was 15 minutes and more than 50% was on counseling and review of test results  [name], MD 01/28/2019 4:17 PM

## 2019-01-28 NOTE — Telephone Encounter (Signed)
"  Tabitha Jackson [phone]).  Sometime this week a white topping appeared around my vaginal area.  I'm trying to keep hair away and using peroxide.  I don't know if she wants to see me to take a look at it but Dr. Alvy Bimler told me to call to notify her if I have an outbreak."   Denies vaginal itching or pain.

## 2019-01-28 NOTE — Assessment & Plan Note (Signed)
She has an area of folliculitis on exam It appears to be healing with no surrounding signs of cellulitis I wrote her a prescription of Keflex to take if the folliculitis get worse over the weekend Otherwise, I recommend conservative approach with watchful observation only

## 2019-01-28 NOTE — Assessment & Plan Note (Signed)
She has chronic leukopenia The skin infection is minor but she is at risk We will continue close monitoring

## 2019-01-28 NOTE — Telephone Encounter (Addendum)
Can she come in today to be evaluated? I can see her today Get labs and see me  Spoke to pt & she will come @ 1:15 for lab & then see Dr Alvy Bimler.

## 2019-02-08 ENCOUNTER — Encounter (HOSPITAL_COMMUNITY): Payer: Self-pay | Admitting: Emergency Medicine

## 2019-02-08 ENCOUNTER — Ambulatory Visit (HOSPITAL_COMMUNITY)
Admission: EM | Admit: 2019-02-08 | Discharge: 2019-02-08 | Disposition: A | Discharge status: Home / Self Care | Payer: Medicare Other | Source: Home / Self Care

## 2019-02-08 ENCOUNTER — Emergency Department (HOSPITAL_COMMUNITY): Payer: Medicare Other

## 2019-02-08 ENCOUNTER — Emergency Department (HOSPITAL_COMMUNITY)
Admission: EM | Admit: 2019-02-08 | Discharge: 2019-02-08 | Disposition: A | Discharge status: Home / Self Care | Payer: Medicare Other | Attending: Emergency Medicine | Admitting: Emergency Medicine

## 2019-02-08 DIAGNOSIS — S0990XA Unspecified injury of head, initial encounter: Secondary | ICD-10-CM | POA: Insufficient documentation

## 2019-02-08 DIAGNOSIS — Y939 Activity, unspecified: Secondary | ICD-10-CM | POA: Diagnosis not present

## 2019-02-08 DIAGNOSIS — W0110XA Fall on same level from slipping, tripping and stumbling with subsequent striking against unspecified object, initial encounter: Secondary | ICD-10-CM | POA: Insufficient documentation

## 2019-02-08 DIAGNOSIS — R05 Cough: Secondary | ICD-10-CM | POA: Diagnosis not present

## 2019-02-08 DIAGNOSIS — Z7982 Long term (current) use of aspirin: Secondary | ICD-10-CM | POA: Diagnosis not present

## 2019-02-08 DIAGNOSIS — R55 Syncope and collapse: Secondary | ICD-10-CM | POA: Diagnosis not present

## 2019-02-08 DIAGNOSIS — Z87891 Personal history of nicotine dependence: Secondary | ICD-10-CM | POA: Diagnosis not present

## 2019-02-08 DIAGNOSIS — Z79899 Other long term (current) drug therapy: Secondary | ICD-10-CM | POA: Diagnosis not present

## 2019-02-08 DIAGNOSIS — Y990 Civilian activity done for income or pay: Secondary | ICD-10-CM | POA: Insufficient documentation

## 2019-02-08 DIAGNOSIS — W19XXXA Unspecified fall, initial encounter: Secondary | ICD-10-CM

## 2019-02-08 DIAGNOSIS — Z8572 Personal history of non-Hodgkin lymphomas: Secondary | ICD-10-CM | POA: Insufficient documentation

## 2019-02-08 DIAGNOSIS — Y9259 Other trade areas as the place of occurrence of the external cause: Secondary | ICD-10-CM | POA: Diagnosis not present

## 2019-02-08 LAB — CBC WITH DIFFERENTIAL/PLATELET
Abs Immature Granulocytes: 0 10*3/uL (ref 0.00–0.07)
Basophils Absolute: 0 10*3/uL (ref 0.0–0.1)
Basophils Relative: 1 %
Eosinophils Absolute: 0.2 10*3/uL (ref 0.0–0.5)
Eosinophils Relative: 7 %
HCT: 37.1 % (ref 36.0–46.0)
Hemoglobin: 11.8 g/dL — ABNORMAL LOW (ref 12.0–15.0)
Immature Granulocytes: 0 %
Lymphocytes Relative: 39 %
Lymphs Abs: 0.9 10*3/uL (ref 0.7–4.0)
MCH: 28.9 pg (ref 26.0–34.0)
MCHC: 31.8 g/dL (ref 30.0–36.0)
MCV: 90.7 fL (ref 80.0–100.0)
Monocytes Absolute: 0.3 10*3/uL (ref 0.1–1.0)
Monocytes Relative: 14 %
Neutro Abs: 0.9 10*3/uL — ABNORMAL LOW (ref 1.7–7.7)
Neutrophils Relative %: 39 %
Platelets: 171 10*3/uL (ref 150–400)
RBC: 4.09 MIL/uL (ref 3.87–5.11)
RDW: 14 % (ref 11.5–15.5)
WBC: 2.3 10*3/uL — ABNORMAL LOW (ref 4.0–10.5)
nRBC: 0 % (ref 0.0–0.2)

## 2019-02-08 LAB — BASIC METABOLIC PANEL
Anion gap: 11 (ref 5–15)
BUN: <5 mg/dL — ABNORMAL LOW (ref 8–23)
CO2: 25 mmol/L (ref 22–32)
Calcium: 9.2 mg/dL (ref 8.9–10.3)
Chloride: 101 mmol/L (ref 98–111)
Creatinine, Ser: 0.72 mg/dL (ref 0.44–1.00)
GFR calc Af Amer: >60 mL/min (ref >60)
GFR calc non Af Amer: >60 mL/min (ref >60)
Glucose, Bld: 112 mg/dL — ABNORMAL HIGH (ref 70–99)
Potassium: 3.3 mmol/L — ABNORMAL LOW (ref 3.5–5.1)
Sodium: 137 mmol/L (ref 135–145)

## 2019-02-08 MED ORDER — POTASSIUM CHLORIDE CRYS ER 20 MEQ PO TBCR
40.0000 meq | EXTENDED_RELEASE_TABLET | Freq: Once | ORAL | Status: AC
  Administered 2019-02-08: 40 meq via ORAL
  Filled 2019-02-08: qty 2

## 2019-02-08 NOTE — ED Provider Notes (Signed)
[facility]PART[facility] Provider Note   CSN: [id number] Arrival date & time: 02/08/19  1303    History   Chief Complaint Chief Complaint  Patient presents with  . Fall    HPI Tabitha Jackson is a 73 y.o. female who presents with a fall and head injury. PMH significant for lymphoma currently in remission, chronic leukopenia. She states that she was at work today and she stood up from her chair and fell backwards and hit her head.This occurred about 8 hours ago. She doesn't know why she fell. She denies lightheadedness/syncope, dizziness, recent illness, tripping, losing her balance. She has some "tingling" and a mild headache from the fall but otherwise no pain. She denies vision difficulty, neck pain, N/V, difficulty walking, chest pain, SOB, abdominal pain. She's been coughing "a little". She is not on blood thinners.   HPI  Past Medical History:  Diagnosis Date  . Arthritis   . Decreased hearing of right ear   . Diffuse large B cell lymphoma ([place]) 02/15/2015  . Radiation 06/11/15-07/09/15   right neck 40 Gy  . Wears glasses   . Wears partial dentures     Patient Active Problem List   Diagnosis Date Noted  . Folliculitis of perineum 01/28/2019  . Marriage separation 12/13/2018  . Other fatigue 04/29/2018  . Chronic leukopenia 08/08/2016  . Neuropathy due to chemotherapeutic drug ([place]) 04/08/2016  . Xerostomia 04/08/2016  . Palliative care by specialist 04/08/2016  . Skin lesion on examination 11/20/2015  . Drug-induced neutropenia ([place]) 10/10/2015  . Soft tissue lesion of pelvic region 10/10/2015  . Anemia in neoplastic disease 03/22/2015  . Leukopenia due to antineoplastic chemotherapy ([place]) 02/23/2015  . History of B-cell lymphoma 02/15/2015  . Multiple lung nodules on CT 02/15/2015  . Neck mass 02/01/2015  . Lymphadenopathy 12/25/2014  . CAP (community acquired pneumonia) 12/20/2014    Past Surgical History:  Procedure Laterality Date  .  ABDOMINAL HYSTERECTOMY    . APPENDECTOMY    . COLONOSCOPY    . EYE SURGERY  2015   cataract left  . MASS EXCISION Right 02/05/2015   Procedure: EXCISIONAL BIOPSY OF RIGHT NECK MASS;  Surgeon: Ascencion Dike, MD;  Location: [facility];  Service: ENT;  Laterality: Right;  . MYRINGOTOMY WITH TUBE PLACEMENT Right 02/05/2015   Procedure: MYRINGOTOMY WITH TUBE PLACEMENT;  Surgeon: Ascencion Dike, MD;  Location: [facility];  Service: ENT;  Laterality: Right;  . TONSILLECTOMY       OB History   No obstetric history on file.      Home Medications    Prior to Admission medications   Medication Sig Start Date End Date Taking? Authorizing Provider  aspirin EC 81 MG tablet Take 81 mg by mouth daily.    [provider]  cephALEXin (KEFLEX) 500 MG capsule Take 1 capsule (500 mg total) by mouth 3 (three) times daily. 01/28/19   [name], MD  cholecalciferol (VITAMIN D) 1000 units tablet Take 1,000 Units by mouth daily.    [provider]  hydroxypropyl methylcellulose / hypromellose (ISOPTO TEARS / GONIOVISC) 2.5 % ophthalmic solution Place 2-3 drops into both eyes 3 (three) times daily as needed for dry eyes.    [provider]  Iron Combinations (IRON COMPLEX PO) Take 1 tablet by mouth daily. Over the counter    [provider]  vitamin B-12 (CYANOCOBALAMIN) 1000 MCG tablet Take 1,000 mcg by mouth daily.  [provider]    Family History Family History  Problem Relation Age of Onset  . Heart disease Mother   . Hypertension Mother   . Cancer Brother        leukemia  . Cancer Brother        colon ca    Social History Social History   Tobacco Use  . Smoking status: Former Smoker    Packs/day: 1.00    Types: Cigarettes    Last attempt to quit: 02/02/1974    Years since quitting: 45.0  . Smokeless tobacco: Never Used  Substance Use Topics  . Alcohol use: No    Alcohol/week: 0.0 standard drinks  . Drug use: No      Allergies   Erythromycin; Hydrocodone; Peanut-containing drug products; and Biaxin [clarithromycin]   Review of Systems Review of Systems  Constitutional: Negative for activity change, appetite change, chills and fever.  HENT: Negative for congestion.   Eyes: Negative for visual disturbance.  Respiratory: Positive for cough. Negative for shortness of breath.   Cardiovascular: Negative for chest pain.  Gastrointestinal: Negative for abdominal pain, nausea and vomiting.  Genitourinary: Negative for dysuria.  Musculoskeletal: Negative for arthralgias, back pain, gait problem, joint swelling, myalgias, neck pain and neck stiffness.  Neurological: Positive for headaches. Negative for dizziness, syncope, weakness and numbness.  All other systems reviewed and are negative.    Physical Exam Updated Vital Signs BP 136/74 (BP Location: Left Arm)   Pulse (!) 54   Temp (!) 97.4 F (36.3 C) (Oral)   Resp 16   SpO2 99%   Physical Exam Vitals signs and nursing note reviewed.  Constitutional:      General: She is not in acute distress.    Appearance: Normal appearance. She is well-developed.     Comments: Calm, cooperative, thin  HENT:     Head: Normocephalic and atraumatic.     Comments: No palpable skull fracture or open wounds    Right Ear: No hemotympanum.     Left Ear: No hemotympanum.     Nose:     Right Nostril: No epistaxis.     Left Nostril: No epistaxis.     Mouth/Throat:     Dentition: Normal dentition.  Eyes:     General: No scleral icterus.       Right eye: No discharge.        Left eye: No discharge.     Conjunctiva/sclera: Conjunctivae normal.     Pupils: Pupils are equal, round, and reactive to light.  Neck:     Musculoskeletal: Normal range of motion and neck supple.     Comments: No midline tenderness Cardiovascular:     Rate and Rhythm: Normal rate and regular rhythm.  Pulmonary:     Effort: Pulmonary effort is normal. No respiratory distress.      Breath sounds: Normal breath sounds.  Abdominal:     General: There is no distension.     Palpations: Abdomen is soft.     Tenderness: There is no abdominal tenderness.  Musculoskeletal:     Comments: 5/5 strength in upper and lower extremities  Skin:    General: Skin is warm and dry.  Neurological:     Mental Status: She is alert and oriented to person, place, and time.  Psychiatric:        Behavior: Behavior normal.      ED Treatments / Results  Labs (all labs ordered are listed, but only abnormal results are displayed) Labs  Reviewed  BASIC METABOLIC PANEL  CBC WITH DIFFERENTIAL/PLATELET    EKG None  Radiology No results found.  Procedures Procedures (including critical care time)  Medications Ordered in ED Medications - No data to display   Initial Impression / Assessment and Plan / ED Course  I have reviewed the triage vital signs and the nursing notes.  Pertinent labs & imaging results that were available during my care of the patient were reviewed by me and considered in my medical decision making (see chart for details).  73 year old female presents with a fall and head injury. It is unclear why she had the fall. She denies any near syncope but also isn't sure fall was mechanical either. Her exam is overall unremarkable. There are no signs of trauma and she has a normal neurologic exam. Will order labs, CXR (she reports recent cough), CT head  CBC is remarkable for leukopenia and anemia which is stable. EKG is sinus bradycardia. CT head is negative. She was given a dose of potassium and advised to f/u with her doctor.  Final Clinical Impressions(s) / ED Diagnoses   Final diagnoses:  Fall, initial encounter  Injury of head, initial encounter    ED Discharge Orders    None       Recardo Evangelist, PA-C 02/08/19 2249    Charlesetta Shanks, MD 02/11/19 [id number]

## 2019-02-08 NOTE — ED Triage Notes (Addendum)
Pt reports falling an hour ago and hit the top of her head on concrete, sent here from UC, no obvious deformity noted. States she thinks she tripped but is unsure. Pt not taking blood thinners. Pt reports tingling in her head. A&O x 4.

## 2019-02-08 NOTE — ED Notes (Signed)
Pt was sent to ED per provider Dr Meda Coffee because pt fell and hit her head on concrete & was symptomatic.  Pt sent to ED for further evaluation.

## 2019-02-08 NOTE — Discharge Instructions (Signed)
Please follow up with your doctor °Return if worsening °

## 2019-02-08 NOTE — ED Notes (Signed)
Patient verbalizes understanding of discharge instructions. Opportunity for questioning and answers were provided. Armband removed by staff, pt discharged from ED, ambulatory to lobby with family.

## 2019-02-12 ENCOUNTER — Other Ambulatory Visit: Payer: Self-pay

## 2019-02-12 ENCOUNTER — Encounter (HOSPITAL_COMMUNITY): Payer: Self-pay | Admitting: *Deleted

## 2019-02-12 ENCOUNTER — Ambulatory Visit (HOSPITAL_COMMUNITY)
Admission: EM | Admit: 2019-02-12 | Discharge: 2019-02-12 | Disposition: A | Discharge status: Home / Self Care | Payer: Medicare Other | Attending: Family Medicine | Admitting: Family Medicine

## 2019-02-12 DIAGNOSIS — A6009 Herpesviral infection of other urogenital tract: Secondary | ICD-10-CM | POA: Diagnosis not present

## 2019-02-12 MED ORDER — VALACYCLOVIR HCL 1 G PO TABS: 1000.0000 mg | ORAL_TABLET | Freq: Two times a day (BID) | ORAL | 0 refills | Status: AC

## 2019-02-12 NOTE — ED Provider Notes (Signed)
[facility]    CSN: [id number] Arrival date & time: 02/12/19  1107     History   Chief Complaint No chief complaint on file.   HPI Tabitha Jackson is a 73 y.o. female history of non-Hodgkin's B-cell lymphoma in remission, herpes presenting today for evaluation of a bump near her rectum.  Patient states that this morning she noticed a bump near her rectum and she is concerned about it being herpes.  States that she has a history of herpes and she typically gets it around her rectum.  She denies any other problems.  She has had some mild pain and itching with it.  Denies any change in bowel movements, blood in stool.  Denies rectal pain with bowel movements.  Denies abdominal pain, nausea or vomiting.  Denies any bumps in the vulvar area.   HPI  Past Medical History:  Diagnosis Date  . Arthritis   . Decreased hearing of right ear   . Diffuse large B cell lymphoma ([place]) 02/15/2015  . Radiation 06/11/15-07/09/15   right neck 40 Gy  . Wears glasses   . Wears partial dentures     Patient Active Problem List   Diagnosis Date Noted  . Folliculitis of perineum 01/28/2019  . Marriage separation 12/13/2018  . Other fatigue 04/29/2018  . Chronic leukopenia 08/08/2016  . Neuropathy due to chemotherapeutic drug ([place]) 04/08/2016  . Xerostomia 04/08/2016  . Palliative care by specialist 04/08/2016  . Skin lesion on examination 11/20/2015  . Drug-induced neutropenia ([place]) 10/10/2015  . Soft tissue lesion of pelvic region 10/10/2015  . Anemia in neoplastic disease 03/22/2015  . Leukopenia due to antineoplastic chemotherapy ([place]) 02/23/2015  . History of B-cell lymphoma 02/15/2015  . Multiple lung nodules on CT 02/15/2015  . Neck mass 02/01/2015  . Lymphadenopathy 12/25/2014  . CAP (community acquired pneumonia) 12/20/2014    Past Surgical History:  Procedure Laterality Date  . ABDOMINAL HYSTERECTOMY    . APPENDECTOMY    . COLONOSCOPY    . EYE SURGERY  2015   cataract left    . MASS EXCISION Right 02/05/2015   Procedure: EXCISIONAL BIOPSY OF RIGHT NECK MASS;  Surgeon: Ascencion Dike, MD;  Location: [facility];  Service: ENT;  Laterality: Right;  . MYRINGOTOMY WITH TUBE PLACEMENT Right 02/05/2015   Procedure: MYRINGOTOMY WITH TUBE PLACEMENT;  Surgeon: Ascencion Dike, MD;  Location: [facility];  Service: ENT;  Laterality: Right;  . TONSILLECTOMY      OB History   No obstetric history on file.      Home Medications    Prior to Admission medications   Medication Sig Start Date End Date Taking? Authorizing Provider  cholecalciferol (VITAMIN D) 1000 units tablet Take 1,000 Units by mouth every evening.     [provider]  Iron Combinations (IRON COMPLEX PO) Take 1 tablet by mouth every evening.     [provider]  valACYclovir (VALTREX) 1000 MG tablet Take 1 tablet (1,000 mg total) by mouth 2 (two) times daily for 14 days. 02/12/19 02/26/19  Ralyn Stlaurent, Elesa Hacker, PA-C    Family History Family History  Problem Relation Age of Onset  . Heart disease Mother   . Hypertension Mother   . Cancer Brother        leukemia  . Cancer Brother        colon ca    Social History Social History   Tobacco Use  . Smoking status: Former Smoker  Packs/day: 1.00    Types: Cigarettes    Last attempt to quit: 02/02/1974    Years since quitting: 45.0  . Smokeless tobacco: Never Used  Substance Use Topics  . Alcohol use: No    Alcohol/week: 0.0 standard drinks  . Drug use: No     Allergies   Erythromycin; Hydrocodone; Peanut-containing drug products; and Biaxin [clarithromycin]   Review of Systems Review of Systems  Constitutional: Negative for fatigue and fever.  HENT: Negative for mouth sores.   Eyes: Negative for visual disturbance.  Respiratory: Negative for shortness of breath.   Cardiovascular: Negative for chest pain.  Gastrointestinal: Negative for abdominal pain, nausea and vomiting.  Genitourinary: Positive for  genital sores.  Musculoskeletal: Negative for arthralgias and joint swelling.  Skin: Negative for color change, rash and wound.  Neurological: Negative for dizziness, weakness, light-headedness and headaches.     Physical Exam Triage Vital Signs ED Triage Vitals  Enc Vitals Group     BP 02/12/19 1154 125/75     Pulse Rate 02/12/19 1154 (!) 55     Resp 02/12/19 1154 18     Temp 02/12/19 1154 98.4 F (36.9 C)     Temp Source 02/12/19 1154 Temporal     SpO2 02/12/19 1154 100 %     Weight --      Height --      Head Circumference --      Peak Flow --      Pain Score 02/12/19 1155 5     Pain Loc --      Pain Edu? --      Excl. in [place]? --    No data found.  Updated Vital Signs BP 125/75 (BP Location: Right Arm)   Pulse (!) 55   Temp 98.4 F (36.9 C) (Temporal)   Resp 18   SpO2 100%   Visual Acuity Right Eye Distance:   Left Eye Distance:   Bilateral Distance:    Right Eye Near:   Left Eye Near:    Bilateral Near:     Physical Exam Vitals signs and nursing note reviewed.  Constitutional:      Appearance: She is well-developed.     Comments: No acute distress  HENT:     Head: Normocephalic and atraumatic.     Nose: Nose normal.  Eyes:     Conjunctiva/sclera: Conjunctivae normal.  Neck:     Musculoskeletal: Neck supple.  Cardiovascular:     Rate and Rhythm: Normal rate.  Pulmonary:     Effort: Pulmonary effort is normal. No respiratory distress.  Abdominal:     General: There is no distension.  Genitourinary:    Comments: No external hemorrhoids, nontender around perirectal area Musculoskeletal: Normal range of motion.  Skin:    General: Skin is warm and dry.     Comments: Right upper gluteal area with cluster of vesicles on erythematous base  Neurological:     Mental Status: She is alert and oriented to person, place, and time.      UC Treatments / Results  Labs (all labs ordered are listed, but only abnormal results are displayed) Labs Reviewed -  No data to display  EKG None  Radiology No results found.  Procedures Procedures (including critical care time)  Medications Ordered in UC Medications - No data to display  Initial Impression / Assessment and Plan / UC Course  I have reviewed the triage vital signs and the nursing notes.  Pertinent labs & imaging results  that were available during my care of the patient were reviewed by me and considered in my medical decision making (see chart for details).     Patient appears to have a herpes-like rash to right gluteal area, will initiate on Valtrex twice daily x2 weeks.  Continue to monitor,Discussed strict return precautions. Patient verbalized understanding and is agreeable with plan.  Patient requested note for her to give to her lawyer stating that she had a herpes outbreak.  Discussed with patient that is typically not written reasons as to why she visited the doctor on notes, but per her request and verbal consent I provided her with this note.  Discussed strict return precautions. Patient verbalized understanding and is agreeable with plan.  Final Clinical Impressions(s) / UC Diagnoses   Final diagnoses:  Herpes genitalis in women     Discharge Instructions     Begin Valtrex twice daily for the next 2 weeks  Please follow-up with your oncologist and primary care as planned  Follow-up if symptoms not resolving or worsening    ED Prescriptions    Medication Sig Dispense Auth. Provider   valACYclovir (VALTREX) 1000 MG tablet Take 1 tablet (1,000 mg total) by mouth 2 (two) times daily for 14 days. 28 tablet Terie Lear, Taylor C, PA-C     Controlled Substance Prescriptions [place] Controlled Substance Registry consulted? Not Applicable   Janith Lima, [place] 02/12/19 1225
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
## 2019-02-12 NOTE — ED Notes (Signed)
Pt discharged by provider.

## 2019-02-12 NOTE — Discharge Instructions (Signed)
Begin Valtrex twice daily for the next 2 weeks  Please follow-up with your oncologist and primary care as planned  Follow-up if symptoms not resolving or worsening

## 2019-02-14 ENCOUNTER — Telehealth: Payer: Self-pay

## 2019-02-14 NOTE — Telephone Encounter (Signed)
Spoke with pt by phone regarding recent visit to ED. Pt verbalizes no new or worsening symptoms to report. Pt is currently scheduled to see Dr Alvy Bimler in May.

## 2019-02-24 DIAGNOSIS — Z1231 Encounter for screening mammogram for malignant neoplasm of breast: Secondary | ICD-10-CM | POA: Diagnosis not present

## 2019-02-24 DIAGNOSIS — Z6823 Body mass index (BMI) 23.0-23.9, adult: Secondary | ICD-10-CM | POA: Diagnosis not present

## 2019-03-24 DIAGNOSIS — C8590 Non-Hodgkin lymphoma, unspecified, unspecified site: Secondary | ICD-10-CM | POA: Diagnosis not present

## 2019-03-24 DIAGNOSIS — E559 Vitamin D deficiency, unspecified: Secondary | ICD-10-CM | POA: Diagnosis not present

## 2019-03-24 DIAGNOSIS — Z0000 Encounter for general adult medical examination without abnormal findings: Secondary | ICD-10-CM | POA: Diagnosis not present

## 2019-03-24 DIAGNOSIS — R739 Hyperglycemia, unspecified: Secondary | ICD-10-CM | POA: Diagnosis not present

## 2019-04-18 DIAGNOSIS — S0181XA Laceration without foreign body of other part of head, initial encounter: Secondary | ICD-10-CM | POA: Diagnosis not present

## 2019-04-18 DIAGNOSIS — L0390 Cellulitis, unspecified: Secondary | ICD-10-CM | POA: Diagnosis not present

## 2019-04-25 ENCOUNTER — Other Ambulatory Visit: Payer: Self-pay | Admitting: Hematology and Oncology

## 2019-04-25 DIAGNOSIS — Z8572 Personal history of non-Hodgkin lymphomas: Secondary | ICD-10-CM

## 2019-04-25 DIAGNOSIS — S0181XA Laceration without foreign body of other part of head, initial encounter: Secondary | ICD-10-CM | POA: Diagnosis not present

## 2019-04-25 DIAGNOSIS — L0390 Cellulitis, unspecified: Secondary | ICD-10-CM | POA: Diagnosis not present

## 2019-04-28 ENCOUNTER — Telehealth: Payer: Self-pay | Admitting: *Deleted

## 2019-04-28 MED ORDER — VALACYCLOVIR HCL 1 G PO TABS: 1000.0000 mg | ORAL_TABLET | Freq: Three times a day (TID) | ORAL | 0 refills | Status: DC

## 2019-04-28 NOTE — Telephone Encounter (Signed)
pls go ahead and refill Typical dose is 1 g TID PO x 7 days

## 2019-04-28 NOTE — Telephone Encounter (Signed)
Patient left voicemail. She has an appointment on Monday for follow up and is desperate for a refill of her Valtrex. She is having a herpes outbreak and needs medication as soon as possible. She is very uncomfortable. Tabitha Jackson

## 2019-04-28 NOTE — Telephone Encounter (Signed)
Prescription sent to the pharmacy. Patient notified. Directions discussed. Patient would like to talk to Dr. Alvy Bimler about a maintenance dose to prevent these outbreaks at her appointment. She has had 4-5 since the beginning of the year.

## 2019-05-02 ENCOUNTER — Encounter: Payer: Self-pay | Admitting: Hematology and Oncology

## 2019-05-02 ENCOUNTER — Inpatient Hospital Stay: Payer: Medicare Other | Attending: Hematology and Oncology | Admitting: Hematology and Oncology

## 2019-05-02 ENCOUNTER — Inpatient Hospital Stay: Payer: Medicare Other

## 2019-05-02 ENCOUNTER — Other Ambulatory Visit: Payer: Self-pay

## 2019-05-02 VITALS — BP 116/86 | HR 74 | Temp 98.2°F | Resp 18 | Ht 65.0 in | Wt 142.6 lb

## 2019-05-02 DIAGNOSIS — D72819 Decreased white blood cell count, unspecified: Secondary | ICD-10-CM | POA: Diagnosis not present

## 2019-05-02 DIAGNOSIS — Z8572 Personal history of non-Hodgkin lymphomas: Secondary | ICD-10-CM

## 2019-05-02 DIAGNOSIS — Z9221 Personal history of antineoplastic chemotherapy: Secondary | ICD-10-CM | POA: Diagnosis not present

## 2019-05-02 DIAGNOSIS — Z923 Personal history of irradiation: Secondary | ICD-10-CM | POA: Diagnosis not present

## 2019-05-02 DIAGNOSIS — B009 Herpesviral infection, unspecified: Secondary | ICD-10-CM | POA: Insufficient documentation

## 2019-05-02 DIAGNOSIS — Z635 Disruption of family by separation and divorce: Secondary | ICD-10-CM

## 2019-05-02 LAB — CBC WITH DIFFERENTIAL/PLATELET
Abs Immature Granulocytes: 0 10*3/uL (ref 0.00–0.07)
Basophils Absolute: 0 10*3/uL (ref 0.0–0.1)
Basophils Relative: 1 %
Eosinophils Absolute: 0.1 10*3/uL (ref 0.0–0.5)
Eosinophils Relative: 2 %
HCT: 36.6 % (ref 36.0–46.0)
Hemoglobin: 12.1 g/dL (ref 12.0–15.0)
Immature Granulocytes: 0 %
Lymphocytes Relative: 28 %
Lymphs Abs: 1 10*3/uL (ref 0.7–4.0)
MCH: 29.7 pg (ref 26.0–34.0)
MCHC: 33.1 g/dL (ref 30.0–36.0)
MCV: 89.7 fL (ref 80.0–100.0)
Monocytes Absolute: 0.4 10*3/uL (ref 0.1–1.0)
Monocytes Relative: 13 %
Neutro Abs: 1.9 10*3/uL (ref 1.7–7.7)
Neutrophils Relative %: 56 %
Platelets: 204 10*3/uL (ref 150–400)
RBC: 4.08 MIL/uL (ref 3.87–5.11)
RDW: 13.5 % (ref 11.5–15.5)
WBC: 3.3 10*3/uL — ABNORMAL LOW (ref 4.0–10.5)
nRBC: 0 % (ref 0.0–0.2)

## 2019-05-02 MED ORDER — ACYCLOVIR 400 MG PO TABS: 400.0000 mg | ORAL_TABLET | Freq: Two times a day (BID) | ORAL | 11 refills | Status: DC

## 2019-05-02 NOTE — Assessment & Plan Note (Signed)
She had recurrent herpes simplex virus reactivation over the past 12 months After she completes her current course of Valtrex, I recommend a trial of acyclovir twice a day for long-term suppression She agreed with the plan of care
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
## 2019-05-02 NOTE — Assessment & Plan Note (Signed)
She has chronic leukopenia Recently, she developed reactivation of herpes simplex virus and is currently on another course of Valtrex Her white count is slightly improved and she is no longer neutropenic She will continue close observation

## 2019-05-02 NOTE — Assessment & Plan Note (Signed)
Examination is satisfactory without signs of disease recurrence Her last CT is negative. I will see her back in 6 months with history, physical examination and blood work.
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
## 2019-05-03 ENCOUNTER — Telehealth: Payer: Self-pay | Admitting: Hematology and Oncology

## 2019-05-03 NOTE — Telephone Encounter (Signed)
Called regarding schedule °

## 2019-05-09 ENCOUNTER — Telehealth: Payer: Self-pay | Admitting: *Deleted
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
## 2019-05-10 NOTE — Telephone Encounter (Signed)
I have spoken with the patient directly that I would not be able to testify on her behalf I suggest her lawyer to consult with infectious disease expert regarding her recurrent infection

## 2019-06-29 DIAGNOSIS — J343 Hypertrophy of nasal turbinates: Secondary | ICD-10-CM | POA: Diagnosis not present

## 2019-06-29 DIAGNOSIS — J310 Chronic rhinitis: Secondary | ICD-10-CM | POA: Diagnosis not present

## 2019-08-19 DIAGNOSIS — Z23 Encounter for immunization: Secondary | ICD-10-CM | POA: Diagnosis not present

## 2019-09-14 DIAGNOSIS — C8590 Non-Hodgkin lymphoma, unspecified, unspecified site: Secondary | ICD-10-CM | POA: Diagnosis not present

## 2019-09-16 ENCOUNTER — Telehealth: Payer: Self-pay

## 2019-09-16 NOTE — Telephone Encounter (Signed)
She called and left a message to call her.   Called back. She is requesting a letter to follow up on previous letter. She will drop off the letter today with the details.

## 2019-09-26 DIAGNOSIS — E559 Vitamin D deficiency, unspecified: Secondary | ICD-10-CM | POA: Diagnosis not present

## 2019-09-26 DIAGNOSIS — R739 Hyperglycemia, unspecified: Secondary | ICD-10-CM | POA: Diagnosis not present

## 2019-09-26 DIAGNOSIS — D72819 Decreased white blood cell count, unspecified: Secondary | ICD-10-CM | POA: Diagnosis not present

## 2019-09-26 DIAGNOSIS — Z0000 Encounter for general adult medical examination without abnormal findings: Secondary | ICD-10-CM | POA: Diagnosis not present

## 2019-09-26 DIAGNOSIS — C8590 Non-Hodgkin lymphoma, unspecified, unspecified site: Secondary | ICD-10-CM | POA: Diagnosis not present

## 2019-10-11 DIAGNOSIS — C8590 Non-Hodgkin lymphoma, unspecified, unspecified site: Secondary | ICD-10-CM | POA: Diagnosis not present

## 2019-10-11 DIAGNOSIS — Z0000 Encounter for general adult medical examination without abnormal findings: Secondary | ICD-10-CM | POA: Diagnosis not present

## 2019-11-03 ENCOUNTER — Inpatient Hospital Stay: Payer: Medicare Other | Attending: Hematology and Oncology | Admitting: Hematology and Oncology

## 2019-11-03 ENCOUNTER — Other Ambulatory Visit: Payer: Self-pay | Admitting: Hematology and Oncology

## 2019-11-03 ENCOUNTER — Other Ambulatory Visit: Payer: Self-pay

## 2019-11-03 ENCOUNTER — Encounter: Payer: Self-pay | Admitting: Hematology and Oncology

## 2019-11-03 ENCOUNTER — Inpatient Hospital Stay: Payer: Medicare Other

## 2019-11-03 ENCOUNTER — Telehealth: Payer: Self-pay | Admitting: Hematology and Oncology

## 2019-11-03 DIAGNOSIS — D72819 Decreased white blood cell count, unspecified: Secondary | ICD-10-CM | POA: Diagnosis not present

## 2019-11-03 DIAGNOSIS — Z8572 Personal history of non-Hodgkin lymphomas: Secondary | ICD-10-CM

## 2019-11-03 DIAGNOSIS — Z923 Personal history of irradiation: Secondary | ICD-10-CM | POA: Diagnosis not present

## 2019-11-03 DIAGNOSIS — Z9221 Personal history of antineoplastic chemotherapy: Secondary | ICD-10-CM | POA: Diagnosis not present

## 2019-11-03 DIAGNOSIS — Z79899 Other long term (current) drug therapy: Secondary | ICD-10-CM | POA: Diagnosis not present

## 2019-11-03 DIAGNOSIS — D702 Other drug-induced agranulocytosis: Secondary | ICD-10-CM

## 2019-11-03 LAB — CBC WITH DIFFERENTIAL/PLATELET
Abs Immature Granulocytes: 0 10*3/uL (ref 0.00–0.07)
Basophils Absolute: 0 10*3/uL (ref 0.0–0.1)
Basophils Relative: 2 %
Eosinophils Absolute: 0.1 10*3/uL (ref 0.0–0.5)
Eosinophils Relative: 4 %
HCT: 36.8 % (ref 36.0–46.0)
Hemoglobin: 12 g/dL (ref 12.0–15.0)
Immature Granulocytes: 0 %
Lymphocytes Relative: 43 %
Lymphs Abs: 1.1 10*3/uL (ref 0.7–4.0)
MCH: 30 pg (ref 26.0–34.0)
MCHC: 32.6 g/dL (ref 30.0–36.0)
MCV: 92 fL (ref 80.0–100.0)
Monocytes Absolute: 0.3 10*3/uL (ref 0.1–1.0)
Monocytes Relative: 12 %
Neutro Abs: 1 10*3/uL — ABNORMAL LOW (ref 1.7–7.7)
Neutrophils Relative %: 39 %
Platelets: 207 10*3/uL (ref 150–400)
RBC: 4 MIL/uL (ref 3.87–5.11)
RDW: 13.2 % (ref 11.5–15.5)
WBC: 2.4 10*3/uL — ABNORMAL LOW (ref 4.0–10.5)
nRBC: 0 % (ref 0.0–0.2)

## 2019-11-03 NOTE — Telephone Encounter (Signed)
Scheduled appt per 11/12 sch message - mailed reminder letter with appt date and time

## 2019-11-03 NOTE — Progress Notes (Signed)
[place] [facility] OFFICE PROGRESS NOTE  Patient Care Team: Jani Gravel, MD as PCP - General (Internal Medicine) [name], MD as Consulting Physician (Hematology and Oncology) [name], MD as Consulting Physician (Otolaryngology)  ASSESSMENT & PLAN:  History of B-cell lymphoma Examination is satisfactory without signs of disease recurrence Her last CT is negative. I will see her back in 5 months with history, physical examination and blood work.  Chronic leukopenia She has chronic leukopenia, could be due to her previous treatment and her African-American heritage She is not symptomatic She will continue close observation   No orders of the defined types were placed in this encounter.   INTERVAL HISTORY: Please see below for problem oriented charting. She returns for further follow-up She is doing well No recent infection, fever or chills No new lymphadenopathy She is now formally separated from her husband and she is relieved because she does not feel like her life is in danger anymore  SUMMARY OF ONCOLOGIC HISTORY: Oncology History  History of B-cell lymphoma  01/31/2015 Imaging   CT scan of the neck showed large mass centered around the right parapharyngeal space, measuring approximately 3.6 cm with bulky lymphadenopathy on the right side. Incidentally 2 nodules were found.   02/05/2015 Pathology Results   Accession: [id number] biopsy confirmed diffuse large B-cell lymphoma   02/05/2015 Surgery   She had excisional lymph node biopsy of the right neck mass   02/16/2015 Imaging    echocardiogram showed preserved ejection fraction.   02/19/2015 Bone Marrow Biopsy   Accession: [id number] BM biopsy was negative for cancer   02/21/2015 Imaging    she had PET CT scan which showed stage II disease.   02/26/2015 Procedure    she had placement of Infuse-a-Port.   03/01/2015 - 04/20/2015 Chemotherapy    she received 3 cycles of R-CHOP chemotherapy with 2 cycles of IT  methotrexate   03/22/2015 Adverse Reaction   Cycle 2 of RCHOP chemotherapy is delayed due to neutropenia    05/10/2015 Imaging   PET scan showed positive response to treatment   06/11/2015 - 07/09/2015 Radiation Therapy   Helical IMRT Tomotherapy to R neck:  40 Gy at 2 Gy per fraction x 20 fractions.   10/08/2015 Imaging   PET scan showed no evidence of disease recurrence   10/18/2015 Procedure   Port-a-cath removed.   04/07/2016 PET scan   Mild increase in FDG uptake associated with the right hilum. The degree of uptake is nonspecific currently 4.08. This area warrants attention on follow-up imaging.   08/07/2016 Imaging   CT scan similar right-sided ground-glass and ill-defined pulmonary nodules   04/28/2017 Imaging   CT chest: 1. Stable appearance ground-glass nodules right upper lobe measuring up to 7 mm. 2. 7 mm sub solid nodule superior segment right lower lobe is stable. 3. Additional follow-up with CT at 12 months is recommended. If persistent, repeat CT is recommended every 2 years until 5 years of stability has been established.    04/28/2017 Imaging   CT neck: 1. Subcentimeter lymph nodes in the upper neck without pathologic enlargement to suggest residual or recurrent disease. 2. Stranding of the right parapharyngeal fat likely reflects posttreatment change without residual or recurrent disease. 3. Mild degenerative changes in the upper cervical spine.   04/28/2018 Imaging   1. Stable appearance of sub solid nodules measuring up to 7 mm. Repeat CT is recommended every 2 years until 5 years of stability has been established. This recommendation follows theconsensus  statement: Guidelines for Management of Incidental pulmonary Nodules Detected on CT Images: From the Fleischner Society 2017; Radiology 2017; 284:228-243. 2. Unchanged appearance of solid subpleural nodule in the posterior right upper lobe and tiny left upper lobe lung nodules, likely representing a benign abnormality or no  further follow-up. 3. Aortic Atherosclerosis (ICD10-I70.0). LAD and left main coronary artery atherosclerotic calcifications.     REVIEW OF SYSTEMS:   Constitutional: Denies fevers, chills or abnormal weight loss Eyes: Denies blurriness of vision Ears, nose, mouth, throat, and face: Denies mucositis or sore throat Respiratory: Denies cough, dyspnea or wheezes Cardiovascular: Denies palpitation, chest discomfort or lower extremity swelling Gastrointestinal:  Denies nausea, heartburn or change in bowel habits Skin: Denies abnormal skin rashes Lymphatics: Denies new lymphadenopathy or easy bruising Neurological:Denies numbness, tingling or new weaknesses Behavioral/Psych: Mood is stable, no new changes  All other systems were reviewed with the patient and are negative.  I have reviewed the past medical history, past surgical history, social history and family history with the patient and they are unchanged from previous note.  ALLERGIES:  is allergic to erythromycin; hydrocodone; peanut-containing drug products; and biaxin [clarithromycin].  MEDICATIONS:  Current Outpatient Medications  Medication Sig Dispense Refill  . acyclovir (ZOVIRAX) 400 MG tablet Take 1 tablet (400 mg total) by mouth 2 (two) times daily. 60 tablet 11  . cholecalciferol (VITAMIN D) 1000 units tablet Take 1,000 Units by mouth every evening.     . Iron Combinations (IRON COMPLEX PO) Take 1 tablet by mouth every evening.      No current facility-administered medications for this visit.     PHYSICAL EXAMINATION: ECOG PERFORMANCE STATUS: 0 - Asymptomatic  Vitals:   11/03/19 0929  BP: 125/75  Pulse: 75  Resp: 18  Temp: 98.7 F (37.1 C)  SpO2: 100%   Filed Weights   11/03/19 0929  Weight: 148 lb 4.8 oz (67.3 kg)    GENERAL:alert, no distress and comfortable SKIN: skin color, texture, turgor are normal, no rashes or significant lesions EYES: normal, Conjunctiva are pink and non-injected, sclera  clear OROPHARYNX:no exudate, no erythema and lips, buccal mucosa, and tongue normal  NECK: supple, thyroid normal size, non-tender, without nodularity LYMPH:  no palpable lymphadenopathy in the cervical, axillary or inguinal LUNGS: clear to auscultation and percussion with normal breathing effort HEART: regular rate & rhythm and no murmurs and no lower extremity edema ABDOMEN:abdomen soft, non-tender and normal bowel sounds Musculoskeletal:no cyanosis of digits and no clubbing  NEURO: alert & oriented x 3 with fluent speech, no focal motor/sensory deficits  LABORATORY DATA:  I have reviewed the data as listed    Component Value Date/Time   NA 137 02/08/2019 2148   NA 141 04/15/2017 0818   K 3.3 (L) 02/08/2019 2148   K 4.8 04/15/2017 0818   CL 101 02/08/2019 2148   CO2 25 02/08/2019 2148   CO2 29 04/15/2017 0818   GLUCOSE 112 (H) 02/08/2019 2148   GLUCOSE 96 04/15/2017 0818   BUN <5 (L) 02/08/2019 2148   BUN 6.8 (L) 04/15/2017 0818   CREATININE 0.72 02/08/2019 2148   CREATININE 0.8 04/15/2017 0818   CALCIUM 9.2 02/08/2019 2148   CALCIUM 9.9 04/15/2017 0818   PROT 6.7 01/28/2019 1313   PROT 7.0 04/15/2017 0818   ALBUMIN 3.8 01/28/2019 1313   ALBUMIN 4.1 04/15/2017 0818   AST 21 01/28/2019 1313   AST 20 04/15/2017 0818   ALT 12 01/28/2019 1313   ALT 15 04/15/2017 0818   ALKPHOS  63 01/28/2019 1313   ALKPHOS 60 04/15/2017 0818   BILITOT 0.7 01/28/2019 1313   BILITOT 0.65 04/15/2017 0818   GFRNONAA >60 02/08/2019 2148   GFRAA >60 02/08/2019 2148    No results found for: SPEP, UPEP  Lab Results  Component Value Date   WBC 2.4 (L) 11/03/2019   NEUTROABS 1.0 (L) 11/03/2019   HGB 12.0 11/03/2019   HCT 36.8 11/03/2019   MCV 92.0 11/03/2019   PLT 207 11/03/2019      Chemistry      Component Value Date/Time   NA 137 02/08/2019 2148   NA 141 04/15/2017 0818   K 3.3 (L) 02/08/2019 2148   K 4.8 04/15/2017 0818   CL 101 02/08/2019 2148   CO2 25 02/08/2019 2148   CO2  29 04/15/2017 0818   BUN <5 (L) 02/08/2019 2148   BUN 6.8 (L) 04/15/2017 0818   CREATININE 0.72 02/08/2019 2148   CREATININE 0.8 04/15/2017 0818      Component Value Date/Time   CALCIUM 9.2 02/08/2019 2148   CALCIUM 9.9 04/15/2017 0818   ALKPHOS 63 01/28/2019 1313   ALKPHOS 60 04/15/2017 0818   AST 21 01/28/2019 1313   AST 20 04/15/2017 0818   ALT 12 01/28/2019 1313   ALT 15 04/15/2017 0818   BILITOT 0.7 01/28/2019 1313   BILITOT 0.65 04/15/2017 0818      All questions were answered. The patient knows to call the clinic with any problems, questions or concerns. No barriers to learning was detected.  I spent 10 minutes counseling the patient face to face. The total time spent in the appointment was 15 minutes and more than 50% was on counseling and review of test results  [name], MD 11/03/2019 10:44 AM

## 2019-11-03 NOTE — Assessment & Plan Note (Signed)
Examination is satisfactory without signs of disease recurrence Her last CT is negative. I will see her back in 5 months with history, physical examination and blood work.

## 2019-11-03 NOTE — Assessment & Plan Note (Signed)
She has chronic leukopenia, could be due to her previous treatment and her African-American heritage She is not symptomatic She will continue close observation

## 2019-12-19 DIAGNOSIS — R079 Chest pain, unspecified: Secondary | ICD-10-CM | POA: Diagnosis not present

## 2019-12-19 DIAGNOSIS — R202 Paresthesia of skin: Secondary | ICD-10-CM | POA: Diagnosis not present

## 2019-12-19 DIAGNOSIS — H6690 Otitis media, unspecified, unspecified ear: Secondary | ICD-10-CM | POA: Diagnosis not present

## 2019-12-20 DIAGNOSIS — R202 Paresthesia of skin: Secondary | ICD-10-CM | POA: Diagnosis not present

## 2019-12-26 ENCOUNTER — Encounter: Payer: Self-pay | Admitting: Cardiology

## 2019-12-26 ENCOUNTER — Other Ambulatory Visit: Payer: Self-pay

## 2019-12-26 ENCOUNTER — Ambulatory Visit: Payer: Medicare Other | Admitting: Cardiology

## 2019-12-26 VITALS — BP 118/83 | HR 85 | Temp 97.8°F | Resp 16 | Ht 65.5 in | Wt 149.0 lb

## 2019-12-26 DIAGNOSIS — R0789 Other chest pain: Secondary | ICD-10-CM | POA: Diagnosis not present

## 2019-12-26 NOTE — Progress Notes (Signed)
Primary Physician/Referring:  Jani Gravel, MD  Patient ID: Lajoyce Lauber, female    DOB: 1946/10/18, 74 y.o.   MRN: [id number]  Chief Complaint  Patient presents with  . Chest Pain   HPI:    ADALAI PATIENCE  is a 74 y.o. AAF With no significant prior cardiovascular history including no history of hypertension, hyperlipidemia, diabetes mellitus, tobacco use disorder, does have history of large B-cell lymphoma involving the neck and is S/P RT and chemotherapy last therapy in 2016 and in remission.  Referred to me for evaluation of chest pain that occurred 3-4 days ago while she was laying in bed in the form of chest tightness that lasted for about a minute or 2.  No recurrence.  She continues to exercise regularly and in fact walked up and down her flight of stairs and climbed 100 steps without any dyspnea or chest pain.  Past Medical History:  Diagnosis Date  . Arthritis   . Decreased hearing of right ear   . Diffuse large B cell lymphoma ([place]) 02/15/2015  . Radiation 06/11/15-07/09/15   right neck 40 Gy  . Wears glasses   . Wears partial dentures    Past Surgical History:  Procedure Laterality Date  . ABDOMINAL HYSTERECTOMY    . APPENDECTOMY    . COLONOSCOPY    . EYE SURGERY  2015   cataract left  . MASS EXCISION Right 02/05/2015   Procedure: EXCISIONAL BIOPSY OF RIGHT NECK MASS;  Surgeon: Ascencion Dike, MD;  Location: [facility];  Service: ENT;  Laterality: Right;  . MYRINGOTOMY WITH TUBE PLACEMENT Right 02/05/2015   Procedure: MYRINGOTOMY WITH TUBE PLACEMENT;  Surgeon: Ascencion Dike, MD;  Location: [facility];  Service: ENT;  Laterality: Right;  . TONSILLECTOMY     Social History   Socioeconomic History  . Marital status: Legally Separated    Spouse name: Not on file  . Number of children: 3  . Years of education: Not on file  . Highest education level: Not on file  Occupational History  . Not on file  Tobacco Use  . Smoking status: Former Smoker   Packs/day: 1.00    Types: Cigarettes    Quit date: 02/02/1974    Years since quitting: 45.9  . Smokeless tobacco: Never Used  Substance and Sexual Activity  . Alcohol use: No    Alcohol/week: 0.0 standard drinks  . Drug use: No  . Sexual activity: Never    Birth control/protection: Abstinence  Other Topics Concern  . Not on file  Social History Narrative  . Not on file   Social Determinants of Health   Financial Resource Strain:   . Difficulty of Paying Living Expenses: Not on file  Food Insecurity:   . Worried About Charity fundraiser in the Last Year: Not on file  . Ran Out of Food in the Last Year: Not on file  Transportation Needs:   . Lack of Transportation (Medical): Not on file  . Lack of Transportation (Non-Medical): Not on file  Physical Activity:   . Days of Exercise per Week: Not on file  . Minutes of Exercise per Session: Not on file  Stress:   . Feeling of Stress : Not on file  Social Connections:   . Frequency of Communication with Friends and Family: Not on file  . Frequency of Social Gatherings with Friends and Family: Not on file  . Attends Religious Services: Not on file  .  Active Member of Clubs or Organizations: Not on file  . Attends Archivist Meetings: Not on file  . Marital Status: Not on file  Intimate Partner Violence:   . Fear of Current or Ex-Partner: Not on file  . Emotionally Abused: Not on file  . Physically Abused: Not on file  . Sexually Abused: Not on file   ROS  Review of Systems  Constitution: Negative for chills, decreased appetite, malaise/fatigue and weight gain.  Cardiovascular: Negative for dyspnea on exertion, leg swelling and syncope.  Endocrine: Negative for cold intolerance.  Hematologic/Lymphatic: Does not bruise/bleed easily.  Musculoskeletal: Negative for joint swelling.  Gastrointestinal: Negative for abdominal pain, anorexia, change in bowel habit, hematochezia and melena.  Neurological: Negative for  headaches and light-headedness.  Psychiatric/Behavioral: Negative for depression and substance abuse.  All other systems reviewed and are negative.  Objective  Blood pressure 118/83, pulse 85, temperature 97.8 F (36.6 C), temperature source Temporal, resp. rate 16, height 5' 5.5" (1.664 m), weight 149 lb (67.6 kg), SpO2 99 %.  Vitals with BMI 12/26/2019 11/03/2019 05/02/2019  Height 5' 5.5" 5\' 5"  5\' 5"   Weight 149 lbs 148 lbs 5 oz 142 lbs 10 oz  BMI 24.41 [id number]  Systolic [id number]  Diastolic 83 75 86  Pulse 85 75 74     Physical Exam  Constitutional:  She is moderately built and well-nourished and appears to be no acute distress.  HENT:  Head: Atraumatic.  Eyes: Conjunctivae are normal.  Neck: No JVD present. No thyromegaly present.  Cardiovascular: Normal rate, regular rhythm, normal heart sounds and intact distal pulses. Exam reveals no gallop.  No murmur heard. No leg edema, no JVD.  Pulmonary/Chest: Effort normal and breath sounds normal.  Abdominal: Soft. Bowel sounds are normal.  Musculoskeletal:        General: Normal range of motion.     Cervical back: Neck supple.  Neurological: She is alert.  Skin: Skin is warm and dry.  Psychiatric: She has a normal mood and affect.   Laboratory examination:   Recent Labs    01/28/19 1313 02/08/19 2148  NA 140 137  K 3.6 3.3*  CL 101 101  CO2 29 25  GLUCOSE 89 112*  BUN 6* <5*  CREATININE 0.78 0.72  CALCIUM 9.3 9.2  GFRNONAA >60 >60  GFRAA >60 >60   CrCl cannot be calculated (Patient's most recent lab result is older than the maximum 21 days allowed.).  CMP Latest Ref Rng & Units 02/08/2019 01/28/2019 12/13/2018  Glucose 70 - 99 mg/dL 112(H) 89 75  BUN 8 - 23 mg/dL <5(L) 6(L) 7(L)  Creatinine 0.44 - 1.00 mg/dL 0.72 0.78 0.77  Sodium 135 - 145 mmol/L 137 140 140  Potassium 3.5 - 5.1 mmol/L 3.3(L) 3.6 3.5  Chloride 98 - 111 mmol/L 101 101 103  CO2 22 - 32 mmol/L 25 29 28   Calcium 8.9 - 10.3 mg/dL 9.2 9.3 9.2    Total Protein 6.5 - 8.1 g/dL - 6.7 6.8  Total Bilirubin 0.3 - 1.2 mg/dL - 0.7 0.4  Alkaline Phos 38 - 126 U/L - 63 64  AST 15 - 41 U/L - 21 23  ALT 0 - 44 U/L - 12 19   CBC Latest Ref Rng & Units 11/03/2019 05/02/2019 02/08/2019  WBC 4.0 - 10.5 K/uL 2.4(L) 3.3(L) 2.3(L)  Hemoglobin 12.0 - 15.0 g/dL 12.0 12.1 11.8(L)  Hematocrit 36.0 - 46.0 % 36.8 36.6 37.1  Platelets 150 - 400 K/uL  207 204 171   Lipid Panel  No results found for: CHOL, TRIG, HDL, CHOLHDL, VLDL, LDLCALC, LDLDIRECT HEMOGLOBIN A1C No results found for: HGBA1C, MPG TSH No results for input(s): TSH in the last 8760 hours.   Medications and allergies   Allergies  Allergen Reactions  . Erythromycin Swelling  . Hydrocodone Swelling  . Peanut-Containing Drug Products Other (See Comments)    "feeling of throat closing sometimes"  . Biaxin [Clarithromycin] Rash     Current Outpatient Medications  Medication Instructions  . acyclovir (ZOVIRAX) 400 mg, Oral, 2 times daily  . cholecalciferol (VITAMIN D) 1,000 Units, Oral, Every evening  . Iron Combinations (IRON COMPLEX PO) 1 tablet, Oral, Every evening    Radiology:  No results found.  Cardiac Studies:   None  Assessment     ICD-10-CM   1. Non-cardiac chest pain  R07.89 EKG 12-Lead    EKG 12/26/2019: Normal sinus rhythm at rate of 70 bpm, borderline left abnormality, poor progression, probably normal variant, otherwise normal EKG.  No evidence of ischemia.   Recommendations:  No orders of the defined types were placed in this encounter.   DIAJA ON  is a 74 y.o. AAF With no significant prior cardiovascular history including no history of hypertension, hyperlipidemia, diabetes mellitus, tobacco use disorder, does have history of large B-cell lymphoma involving the neck and is S/P RT and chemotherapy last therapy in 2016 and in remission.   Patient referred to me for evaluation of chest pain that occurred about 3-4 days ago while she was laying in bed,  symptoms appear to be more consistent with noncardiac etiology.  She has no other significant cardiovascular risk factors.  Physical examination is completely normal and so is the EKG.  She does have abdominal prominent pulsation but aortic measurements bi-manual exam appeared to be completely normal.  Do not suspect AAA. Her labs from PCP reviewed, normal lipids and CBC and CMP.   Did not order any labs or any cardiac testing for her for now unless she has recurrence of symptoms.  She was reassured.  Adrian Prows, MD, [facility] 12/26/2019, [place] [facility] Pager: [phone] Office: [phone]

## 2019-12-28 DIAGNOSIS — J310 Chronic rhinitis: Secondary | ICD-10-CM | POA: Diagnosis not present

## 2019-12-28 DIAGNOSIS — J343 Hypertrophy of nasal turbinates: Secondary | ICD-10-CM | POA: Diagnosis not present

## 2020-01-16 DIAGNOSIS — R202 Paresthesia of skin: Secondary | ICD-10-CM | POA: Diagnosis not present

## 2020-01-16 DIAGNOSIS — C8590 Non-Hodgkin lymphoma, unspecified, unspecified site: Secondary | ICD-10-CM | POA: Diagnosis not present

## 2020-01-16 DIAGNOSIS — R079 Chest pain, unspecified: Secondary | ICD-10-CM | POA: Diagnosis not present

## 2020-02-15 DIAGNOSIS — C8590 Non-Hodgkin lymphoma, unspecified, unspecified site: Secondary | ICD-10-CM | POA: Diagnosis not present

## 2020-03-06 DIAGNOSIS — Z1231 Encounter for screening mammogram for malignant neoplasm of breast: Secondary | ICD-10-CM | POA: Diagnosis not present

## 2020-03-06 DIAGNOSIS — Z6824 Body mass index (BMI) 24.0-24.9, adult: Secondary | ICD-10-CM | POA: Diagnosis not present

## 2020-03-06 DIAGNOSIS — N958 Other specified menopausal and perimenopausal disorders: Secondary | ICD-10-CM | POA: Diagnosis not present

## 2020-03-06 DIAGNOSIS — M8588 Other specified disorders of bone density and structure, other site: Secondary | ICD-10-CM | POA: Diagnosis not present

## 2020-03-19 DIAGNOSIS — M25572 Pain in left ankle and joints of left foot: Secondary | ICD-10-CM | POA: Diagnosis not present

## 2020-04-11 ENCOUNTER — Other Ambulatory Visit: Payer: Self-pay | Admitting: Hematology and Oncology

## 2020-04-13 ENCOUNTER — Other Ambulatory Visit: Payer: Self-pay | Admitting: Hematology and Oncology

## 2020-04-13 DIAGNOSIS — D72819 Decreased white blood cell count, unspecified: Secondary | ICD-10-CM

## 2020-04-13 DIAGNOSIS — Z8572 Personal history of non-Hodgkin lymphomas: Secondary | ICD-10-CM

## 2020-04-16 ENCOUNTER — Other Ambulatory Visit: Payer: Self-pay

## 2020-04-16 ENCOUNTER — Encounter: Payer: Self-pay | Admitting: Hematology and Oncology

## 2020-04-16 ENCOUNTER — Inpatient Hospital Stay: Payer: Medicare Other | Attending: Hematology and Oncology | Admitting: Hematology and Oncology

## 2020-04-16 ENCOUNTER — Telehealth: Payer: Self-pay | Admitting: Hematology and Oncology

## 2020-04-16 ENCOUNTER — Inpatient Hospital Stay: Payer: Medicare Other

## 2020-04-16 VITALS — BP 130/83 | HR 79 | Temp 97.8°F | Resp 18 | Ht 65.5 in | Wt 141.8 lb

## 2020-04-16 DIAGNOSIS — D72819 Decreased white blood cell count, unspecified: Secondary | ICD-10-CM

## 2020-04-16 DIAGNOSIS — Z9221 Personal history of antineoplastic chemotherapy: Secondary | ICD-10-CM | POA: Insufficient documentation

## 2020-04-16 DIAGNOSIS — Z8572 Personal history of non-Hodgkin lymphomas: Secondary | ICD-10-CM | POA: Diagnosis not present

## 2020-04-16 DIAGNOSIS — Z733 Stress, not elsewhere classified: Secondary | ICD-10-CM | POA: Diagnosis not present

## 2020-04-16 DIAGNOSIS — R634 Abnormal weight loss: Secondary | ICD-10-CM

## 2020-04-16 LAB — CBC WITH DIFFERENTIAL/PLATELET
Abs Immature Granulocytes: 0 10*3/uL (ref 0.00–0.07)
Basophils Absolute: 0 10*3/uL (ref 0.0–0.1)
Basophils Relative: 1 %
Eosinophils Absolute: 0.1 10*3/uL (ref 0.0–0.5)
Eosinophils Relative: 3 %
HCT: 39.5 % (ref 36.0–46.0)
Hemoglobin: 12.6 g/dL (ref 12.0–15.0)
Immature Granulocytes: 0 %
Lymphocytes Relative: 46 %
Lymphs Abs: 1.1 10*3/uL (ref 0.7–4.0)
MCH: 29.4 pg (ref 26.0–34.0)
MCHC: 31.9 g/dL (ref 30.0–36.0)
MCV: 92.1 fL (ref 80.0–100.0)
Monocytes Absolute: 0.2 10*3/uL (ref 0.1–1.0)
Monocytes Relative: 10 %
Neutro Abs: 0.9 10*3/uL — ABNORMAL LOW (ref 1.7–7.7)
Neutrophils Relative %: 40 %
Platelets: 205 10*3/uL (ref 150–400)
RBC: 4.29 MIL/uL (ref 3.87–5.11)
RDW: 13 % (ref 11.5–15.5)
WBC: 2.3 10*3/uL — ABNORMAL LOW (ref 4.0–10.5)
nRBC: 0 % (ref 0.0–0.2)

## 2020-04-16 NOTE — Assessment & Plan Note (Signed)
She continues to have persistent chronic leukopenia I plan to check vitamin B12 in the next visit

## 2020-04-16 NOTE — Progress Notes (Signed)
[place] OFFICE PROGRESS NOTE  Patient Care Team: Jani Gravel, MD as PCP - General (Internal Medicine) [name], MD as Consulting Physician (Hematology and Oncology) [name], MD as Consulting Physician (Otolaryngology)  ASSESSMENT & PLAN:  History of B-cell lymphoma She is a long-term cancer survivor Her examination is benign I do not plan to order surveillance imaging study in the absence of disease I will see her back in a year for further follow-up  Chronic leukopenia She continues to have persistent chronic leukopenia I plan to check vitamin B12 in the next visit  Weight loss She has tremendous stress recently and have lost weight The patient wants me to write a letter to give it to her to see that she have lost weight because of stress so that she can use it in her next court hearing to facilitate the separation process from her husband   Orders Placed This Encounter  Procedures  . CBC with Differential/Platelet    Standing Status:   Future    Standing Expiration Date:   05/21/2021  . Vitamin B12    Standing Status:   Future    Standing Expiration Date:   05/21/2021    All questions were answered. The patient knows to call the clinic with any problems, questions or concerns. The total time spent in the appointment was 20 minutes encounter with patients including review of chart and various tests results, discussions about plan of care and coordination of care plan   [name], MD 04/16/2020 12:18 PM  INTERVAL HISTORY: Please see below for problem oriented charting. She returns for lymphoma follow-up Denies recent infection, fever or chills She is in the process of getting divorce from her husband She also is under tremendous stress due to renovation in the house No new lymphadenopathy Her appetite is fair  SUMMARY OF ONCOLOGIC HISTORY: Oncology History  History of B-cell lymphoma  01/31/2015 Imaging   CT scan of the neck showed large mass  centered around the right parapharyngeal space, measuring approximately 3.6 cm with bulky lymphadenopathy on the right side. Incidentally 2 nodules were found.   02/05/2015 Pathology Results   Accession: [id number] biopsy confirmed diffuse large B-cell lymphoma   02/05/2015 Surgery   She had excisional lymph node biopsy of the right neck mass   02/16/2015 Imaging    echocardiogram showed preserved ejection fraction.   02/19/2015 Bone Marrow Biopsy   Accession: [id number] BM biopsy was negative for cancer   02/21/2015 Imaging    she had PET CT scan which showed stage II disease.   02/26/2015 Procedure    she had placement of Infuse-a-Port.   03/01/2015 - 04/20/2015 Chemotherapy    she received 3 cycles of R-CHOP chemotherapy with 2 cycles of IT methotrexate   03/22/2015 Adverse Reaction   Cycle 2 of RCHOP chemotherapy is delayed due to neutropenia    05/10/2015 Imaging   PET scan showed positive response to treatment   06/11/2015 - 07/09/2015 Radiation Therapy   Helical IMRT Tomotherapy to R neck:  40 Gy at 2 Gy per fraction x 20 fractions.   10/08/2015 Imaging   PET scan showed no evidence of disease recurrence   10/18/2015 Procedure   Port-a-cath removed.   04/07/2016 PET scan   Mild increase in FDG uptake associated with the right hilum. The degree of uptake is nonspecific currently 4.08. This area warrants attention on follow-up imaging.   08/07/2016 Imaging   CT scan similar right-sided ground-glass and ill-defined pulmonary nodules  04/28/2017 Imaging   CT chest: 1. Stable appearance ground-glass nodules right upper lobe measuring up to 7 mm. 2. 7 mm sub solid nodule superior segment right lower lobe is stable. 3. Additional follow-up with CT at 12 months is recommended. If persistent, repeat CT is recommended every 2 years until 5 years of stability has been established.    04/28/2017 Imaging   CT neck: 1. Subcentimeter lymph nodes in the upper neck without pathologic enlargement to  suggest residual or recurrent disease. 2. Stranding of the right parapharyngeal fat likely reflects posttreatment change without residual or recurrent disease. 3. Mild degenerative changes in the upper cervical spine.   04/28/2018 Imaging   1. Stable appearance of sub solid nodules measuring up to 7 mm. Repeat CT is recommended every 2 years until 5 years of stability has been established. This recommendation follows theconsensus statement: Guidelines for Management of Incidental pulmonary Nodules Detected on CT Images: From the Fleischner Society 2017; Radiology 2017; 284:228-243. 2. Unchanged appearance of solid subpleural nodule in the posterior right upper lobe and tiny left upper lobe lung nodules, likely representing a benign abnormality or no further follow-up. 3. Aortic Atherosclerosis (ICD10-I70.0). LAD and left main coronary artery atherosclerotic calcifications.     REVIEW OF SYSTEMS:   Constitutional: Denies fevers, chills or abnormal weight loss Eyes: Denies blurriness of vision Ears, nose, mouth, throat, and face: Denies mucositis or sore throat Respiratory: Denies cough, dyspnea or wheezes Cardiovascular: Denies palpitation, chest discomfort or lower extremity swelling Gastrointestinal:  Denies nausea, heartburn or change in bowel habits Skin: Denies abnormal skin rashes Lymphatics: Denies new lymphadenopathy or easy bruising Neurological:Denies numbness, tingling or new weaknesses Behavioral/Psych: Mood is stable, no new changes  All other systems were reviewed with the patient and are negative.  I have reviewed the past medical history, past surgical history, social history and family history with the patient and they are unchanged from previous note.  ALLERGIES:  is allergic to erythromycin; hydrocodone; peanut-containing drug products; and biaxin [clarithromycin].  MEDICATIONS:  Current Outpatient Medications  Medication Sig Dispense Refill  . acyclovir (ZOVIRAX) 400 MG  tablet TAKE 1 TABLET(400 MG) BY MOUTH TWICE DAILY 60 tablet 11  . cholecalciferol (VITAMIN D) 1000 units tablet Take 1,000 Units by mouth every evening.     . Iron Combinations (IRON COMPLEX PO) Take 1 tablet by mouth every evening.      No current facility-administered medications for this visit.    PHYSICAL EXAMINATION: ECOG PERFORMANCE STATUS: 0 - Asymptomatic  Vitals:   04/16/20 1134  BP: 130/83  Pulse: 79  Resp: 18  Temp: 97.8 F (36.6 C)  SpO2: 100%   Filed Weights   04/16/20 1134  Weight: 141 lb 12.8 oz (64.3 kg)    GENERAL:alert, no distress and comfortable SKIN: skin color, texture, turgor are normal, no rashes or significant lesions EYES: normal, Conjunctiva are pink and non-injected, sclera clear OROPHARYNX:no exudate, no erythema and lips, buccal mucosa, and tongue normal  NECK: supple, thyroid normal size, non-tender, without nodularity LYMPH:  no palpable lymphadenopathy in the cervical, axillary or inguinal LUNGS: clear to auscultation and percussion with normal breathing effort HEART: regular rate & rhythm and no murmurs and no lower extremity edema ABDOMEN:abdomen soft, non-tender and normal bowel sounds Musculoskeletal:no cyanosis of digits and no clubbing  NEURO: alert & oriented x 3 with fluent speech, no focal motor/sensory deficits  LABORATORY DATA:  I have reviewed the data as listed    Component Value Date/Time   NA  137 02/08/2019 2148   NA 141 04/15/2017 0818   K 3.3 (L) 02/08/2019 2148   K 4.8 04/15/2017 0818   CL 101 02/08/2019 2148   CO2 25 02/08/2019 2148   CO2 29 04/15/2017 0818   GLUCOSE 112 (H) 02/08/2019 2148   GLUCOSE 96 04/15/2017 0818   BUN <5 (L) 02/08/2019 2148   BUN 6.8 (L) 04/15/2017 0818   CREATININE 0.72 02/08/2019 2148   CREATININE 0.8 04/15/2017 0818   CALCIUM 9.2 02/08/2019 2148   CALCIUM 9.9 04/15/2017 0818   PROT 6.7 01/28/2019 1313   PROT 7.0 04/15/2017 0818   ALBUMIN 3.8 01/28/2019 1313   ALBUMIN 4.1 04/15/2017  0818   AST 21 01/28/2019 1313   AST 20 04/15/2017 0818   ALT 12 01/28/2019 1313   ALT 15 04/15/2017 0818   ALKPHOS 63 01/28/2019 1313   ALKPHOS 60 04/15/2017 0818   BILITOT 0.7 01/28/2019 1313   BILITOT 0.65 04/15/2017 0818   GFRNONAA >60 02/08/2019 2148   GFRAA >60 02/08/2019 2148    No results found for: SPEP, UPEP  Lab Results  Component Value Date   WBC 2.3 (L) 04/16/2020   NEUTROABS 0.9 (L) 04/16/2020   HGB 12.6 04/16/2020   HCT 39.5 04/16/2020   MCV 92.1 04/16/2020   PLT 205 04/16/2020      Chemistry      Component Value Date/Time   NA 137 02/08/2019 2148   NA 141 04/15/2017 0818   K 3.3 (L) 02/08/2019 2148   K 4.8 04/15/2017 0818   CL 101 02/08/2019 2148   CO2 25 02/08/2019 2148   CO2 29 04/15/2017 0818   BUN <5 (L) 02/08/2019 2148   BUN 6.8 (L) 04/15/2017 0818   CREATININE 0.72 02/08/2019 2148   CREATININE 0.8 04/15/2017 0818      Component Value Date/Time   CALCIUM 9.2 02/08/2019 2148   CALCIUM 9.9 04/15/2017 0818   ALKPHOS 63 01/28/2019 1313   ALKPHOS 60 04/15/2017 0818   AST 21 01/28/2019 1313   AST 20 04/15/2017 0818   ALT 12 01/28/2019 1313   ALT 15 04/15/2017 0818   BILITOT 0.7 01/28/2019 1313   BILITOT 0.65 04/15/2017 0818

## 2020-04-16 NOTE — Telephone Encounter (Signed)
Scheduled appts per 4/26 sch msg. Pt confirmed appt date and time.  

## 2020-04-16 NOTE — Assessment & Plan Note (Signed)
She is a long-term cancer survivor Her examination is benign I do not plan to order surveillance imaging study in the absence of disease I will see her back in a year for further follow-up

## 2020-04-16 NOTE — Assessment & Plan Note (Signed)
She has tremendous stress recently and have lost weight The patient wants me to write a letter to give it to her to see that she have lost weight because of stress so that she can use it in her next court hearing to facilitate the separation process from her husband

## 2020-04-20 ENCOUNTER — Telehealth: Payer: Self-pay

## 2020-04-20 NOTE — Telephone Encounter (Signed)
Called and given below message. She verbalized understanding. 

## 2020-04-20 NOTE — Telephone Encounter (Signed)
Just multivitamin is sufficient

## 2020-04-20 NOTE — Telephone Encounter (Signed)
She is calling regarding recent visit. Should she take a multivitamin only? Or take multivitamin, vitamin D and Iron tablet?

## 2020-06-15 ENCOUNTER — Telehealth: Payer: Self-pay

## 2020-06-15 NOTE — Telephone Encounter (Signed)
She called and left a message asking if it is okay to get the covid vaccine?  Called back and per Dr. Alvy Bimler, okay to get the vaccine. She verbalized understanding.

## 2020-07-17 DIAGNOSIS — I7300 Raynaud's syndrome without gangrene: Secondary | ICD-10-CM | POA: Diagnosis not present

## 2020-07-17 DIAGNOSIS — R0989 Other specified symptoms and signs involving the circulatory and respiratory systems: Secondary | ICD-10-CM | POA: Diagnosis not present

## 2020-10-01 ENCOUNTER — Telehealth: Payer: Self-pay

## 2020-10-01 NOTE — Telephone Encounter (Signed)
She called and requested a copy of the letter that Dr. Alvy Bimler wrote on 05/02/2019 for her. Copy of letter printed off and she will pick up the copy.

## 2020-10-05 DIAGNOSIS — Z23 Encounter for immunization: Secondary | ICD-10-CM | POA: Diagnosis not present

## 2020-10-29 DIAGNOSIS — E559 Vitamin D deficiency, unspecified: Secondary | ICD-10-CM | POA: Diagnosis not present

## 2020-10-29 DIAGNOSIS — Z0000 Encounter for general adult medical examination without abnormal findings: Secondary | ICD-10-CM | POA: Diagnosis not present

## 2020-10-29 DIAGNOSIS — R739 Hyperglycemia, unspecified: Secondary | ICD-10-CM | POA: Diagnosis not present

## 2020-11-05 DIAGNOSIS — D72819 Decreased white blood cell count, unspecified: Secondary | ICD-10-CM | POA: Diagnosis not present

## 2020-11-05 DIAGNOSIS — E559 Vitamin D deficiency, unspecified: Secondary | ICD-10-CM | POA: Diagnosis not present

## 2020-11-05 DIAGNOSIS — C8590 Non-Hodgkin lymphoma, unspecified, unspecified site: Secondary | ICD-10-CM | POA: Diagnosis not present

## 2020-11-05 DIAGNOSIS — Z0000 Encounter for general adult medical examination without abnormal findings: Secondary | ICD-10-CM | POA: Diagnosis not present

## 2020-11-19 ENCOUNTER — Telehealth: Payer: Self-pay

## 2020-11-19 DIAGNOSIS — H40033 Anatomical narrow angle, bilateral: Secondary | ICD-10-CM | POA: Diagnosis not present

## 2020-11-19 DIAGNOSIS — H43393 Other vitreous opacities, bilateral: Secondary | ICD-10-CM | POA: Diagnosis not present

## 2020-11-19 NOTE — Telephone Encounter (Signed)
She called and left a message asking if she could get the covid booster? Last covid injection was in July.  Called back and left a message. Dr. Alvy Bimler is okay with her getting the covid booster.

## 2020-11-30 DIAGNOSIS — H903 Sensorineural hearing loss, bilateral: Secondary | ICD-10-CM | POA: Diagnosis not present

## 2020-11-30 DIAGNOSIS — J343 Hypertrophy of nasal turbinates: Secondary | ICD-10-CM | POA: Diagnosis not present

## 2020-11-30 DIAGNOSIS — J310 Chronic rhinitis: Secondary | ICD-10-CM | POA: Diagnosis not present

## 2020-11-30 DIAGNOSIS — H9202 Otalgia, left ear: Secondary | ICD-10-CM | POA: Diagnosis not present

## 2020-12-17 DIAGNOSIS — L304 Erythema intertrigo: Secondary | ICD-10-CM | POA: Diagnosis not present

## 2021-02-05 DIAGNOSIS — L299 Pruritus, unspecified: Secondary | ICD-10-CM | POA: Diagnosis not present

## 2021-02-25 ENCOUNTER — Telehealth: Payer: Self-pay | Admitting: Hematology and Oncology

## 2021-02-25 NOTE — Telephone Encounter (Signed)
Called pt per 3/2 sch msg - no answer. Left message for pt to call back to reschedule if needed

## 2021-02-27 ENCOUNTER — Telehealth: Payer: Self-pay | Admitting: *Deleted

## 2021-02-27 NOTE — Telephone Encounter (Signed)
Tabitha Jackson states she has a rash on her neck and chest that " Comes and goes" for ~1 month. Is not sure if it is stress or one of the pills she is taking.   Went to PCP a few months ago for severe burn like rash under breasts. Did not take any medication, but kept area dry with hair dryer. Has not called PCP about rash on neck and chest.

## 2021-02-27 NOTE — Telephone Encounter (Signed)
They are not related to her history of lymphoma I suggest she call for appt with PCP

## 2021-02-27 NOTE — Telephone Encounter (Signed)
LM with note below 

## 2021-02-28 ENCOUNTER — Other Ambulatory Visit: Payer: Self-pay | Admitting: Hematology and Oncology

## 2021-04-08 DIAGNOSIS — Z6822 Body mass index (BMI) 22.0-22.9, adult: Secondary | ICD-10-CM | POA: Diagnosis not present

## 2021-04-08 DIAGNOSIS — Z1231 Encounter for screening mammogram for malignant neoplasm of breast: Secondary | ICD-10-CM | POA: Diagnosis not present

## 2021-04-16 ENCOUNTER — Inpatient Hospital Stay: Payer: Medicare Other

## 2021-04-16 ENCOUNTER — Other Ambulatory Visit: Payer: Self-pay

## 2021-04-16 ENCOUNTER — Encounter: Payer: Self-pay | Admitting: Hematology and Oncology

## 2021-04-16 ENCOUNTER — Inpatient Hospital Stay: Payer: Medicare Other | Attending: Hematology and Oncology | Admitting: Hematology and Oncology

## 2021-04-16 DIAGNOSIS — D72819 Decreased white blood cell count, unspecified: Secondary | ICD-10-CM | POA: Insufficient documentation

## 2021-04-16 DIAGNOSIS — Z8572 Personal history of non-Hodgkin lymphomas: Secondary | ICD-10-CM

## 2021-04-16 DIAGNOSIS — B009 Herpesviral infection, unspecified: Secondary | ICD-10-CM | POA: Diagnosis not present

## 2021-04-16 LAB — VITAMIN B12: Vitamin B-12: 724 pg/mL (ref 180–914)

## 2021-04-16 LAB — CBC WITH DIFFERENTIAL/PLATELET
Abs Immature Granulocytes: 0 10*3/uL (ref 0.00–0.07)
Basophils Absolute: 0 10*3/uL (ref 0.0–0.1)
Basophils Relative: 1 %
Eosinophils Absolute: 0.1 10*3/uL (ref 0.0–0.5)
Eosinophils Relative: 4 %
HCT: 37.9 % (ref 36.0–46.0)
Hemoglobin: 12.4 g/dL (ref 12.0–15.0)
Immature Granulocytes: 0 %
Lymphocytes Relative: 32 %
Lymphs Abs: 0.8 10*3/uL (ref 0.7–4.0)
MCH: 29.5 pg (ref 26.0–34.0)
MCHC: 32.7 g/dL (ref 30.0–36.0)
MCV: 90.2 fL (ref 80.0–100.0)
Monocytes Absolute: 0.2 10*3/uL (ref 0.1–1.0)
Monocytes Relative: 10 %
Neutro Abs: 1.3 10*3/uL — ABNORMAL LOW (ref 1.7–7.7)
Neutrophils Relative %: 53 %
Platelets: 198 10*3/uL (ref 150–400)
RBC: 4.2 MIL/uL (ref 3.87–5.11)
RDW: 13.5 % (ref 11.5–15.5)
WBC: 2.5 10*3/uL — ABNORMAL LOW (ref 4.0–10.5)
nRBC: 0 % (ref 0.0–0.2)

## 2021-04-16 NOTE — Progress Notes (Signed)
[facility] OFFICE PROGRESS NOTE  Patient Care Team: Jani Gravel, MD as PCP - General (Internal Medicine) [name], MD as Consulting Physician (Hematology and Oncology) [name], MD as Consulting Physician (Otolaryngology)  ASSESSMENT & PLAN:  History of B-cell lymphoma She is a long-term cancer survivor Her examination is benign I do not plan to order surveillance imaging study in the absence of disease I educated the patient signs and symptoms to watch out for cancer recurrence She does not need long-term follow-up  Chronic leukopenia She continues to have persistent chronic leukopenia, likely constitutional in nature Vitamin B12 level is adequate She does not need long-term follow-up of further investigation  Recurrent herpes simplex She had recurrent herpes simplex virus reactivation  She will continue acyclovir indefinitely   No orders of the defined types were placed in this encounter.   All questions were answered. The patient knows to call the clinic with any problems, questions or concerns. The total time spent in the appointment was 20 minutes encounter with patients including review of chart and various tests results, discussions about plan of care and coordination of care plan   [name], MD 04/16/2021 1:16 PM  INTERVAL HISTORY: Please see below for problem oriented charting. She returns for further follow-up Denies recent infection She is still undergoing stressful situation with her divorce She has to return back to work to pay for legal fees Otherwise, she continues to remain active No lymphadenopathy  SUMMARY OF ONCOLOGIC HISTORY: Oncology History  History of B-cell lymphoma  01/31/2015 Imaging   CT scan of the neck showed large mass centered around the right parapharyngeal space, measuring approximately 3.6 cm with bulky lymphadenopathy on the right side. Incidentally 2 nodules were found.   02/05/2015 Pathology Results   Accession:  [id number] biopsy confirmed diffuse large B-cell lymphoma   02/05/2015 Surgery   She had excisional lymph node biopsy of the right neck mass   02/16/2015 Imaging    echocardiogram showed preserved ejection fraction.   02/19/2015 Bone Marrow Biopsy   Accession: [id number] BM biopsy was negative for cancer   02/21/2015 Imaging    she had PET CT scan which showed stage II disease.   02/26/2015 Procedure    she had placement of Infuse-a-Port.   03/01/2015 - 04/20/2015 Chemotherapy    she received 3 cycles of R-CHOP chemotherapy with 2 cycles of IT methotrexate   03/22/2015 Adverse Reaction   Cycle 2 of RCHOP chemotherapy is delayed due to neutropenia    05/10/2015 Imaging   PET scan showed positive response to treatment   06/11/2015 - 07/09/2015 Radiation Therapy   Helical IMRT Tomotherapy to R neck:  40 Gy at 2 Gy per fraction x 20 fractions.   10/08/2015 Imaging   PET scan showed no evidence of disease recurrence   10/18/2015 Procedure   Port-a-cath removed.   04/07/2016 PET scan   Mild increase in FDG uptake associated with the right hilum. The degree of uptake is nonspecific currently 4.08. This area warrants attention on follow-up imaging.   08/07/2016 Imaging   CT scan similar right-sided ground-glass and ill-defined pulmonary nodules   04/28/2017 Imaging   CT chest: 1. Stable appearance ground-glass nodules right upper lobe measuring up to 7 mm. 2. 7 mm sub solid nodule superior segment right lower lobe is stable. 3. Additional follow-up with CT at 12 months is recommended. If persistent, repeat CT is recommended every 2 years until 5 years of stability has been established.  04/28/2017 Imaging   CT neck: 1. Subcentimeter lymph nodes in the upper neck without pathologic enlargement to suggest residual or recurrent disease. 2. Stranding of the right parapharyngeal fat likely reflects posttreatment change without residual or recurrent disease. 3. Mild degenerative changes in the upper  cervical spine.   04/28/2018 Imaging   1. Stable appearance of sub solid nodules measuring up to 7 mm. Repeat CT is recommended every 2 years until 5 years of stability has been established. This recommendation follows theconsensus statement: Guidelines for Management of Incidental pulmonary Nodules Detected on CT Images: From the Fleischner Society 2017; Radiology 2017; 284:228-243. 2. Unchanged appearance of solid subpleural nodule in the posterior right upper lobe and tiny left upper lobe lung nodules, likely representing a benign abnormality or no further follow-up. 3. Aortic Atherosclerosis (ICD10-I70.0). LAD and left main coronary artery atherosclerotic calcifications.     REVIEW OF SYSTEMS:   Constitutional: Denies fevers, chills or abnormal weight loss Eyes: Denies blurriness of vision Ears, nose, mouth, throat, and face: Denies mucositis or sore throat Respiratory: Denies cough, dyspnea or wheezes Cardiovascular: Denies palpitation, chest discomfort or lower extremity swelling Gastrointestinal:  Denies nausea, heartburn or change in bowel habits Skin: Denies abnormal skin rashes Lymphatics: Denies new lymphadenopathy or easy bruising Neurological:Denies numbness, tingling or new weaknesses Behavioral/Psych: Mood is stable, no new changes  All other systems were reviewed with the patient and are negative.  I have reviewed the past medical history, past surgical history, social history and family history with the patient and they are unchanged from previous note.  ALLERGIES:  is allergic to erythromycin, hydrocodone, peanut-containing drug products, and biaxin [clarithromycin].  MEDICATIONS:  Current Outpatient Medications  Medication Sig Dispense Refill  . acyclovir (ZOVIRAX) 400 MG tablet Take 1 tablet (400 mg total) by mouth daily at 2 PM. 90 tablet 3  . cholecalciferol (VITAMIN D) 1000 units tablet Take 1,000 Units by mouth every evening.     . Iron Combinations (IRON COMPLEX  PO) Take 1 tablet by mouth every evening.      No current facility-administered medications for this visit.    PHYSICAL EXAMINATION: ECOG PERFORMANCE STATUS: 0 - Asymptomatic  Vitals:   04/16/21 1127  BP: 118/61  Pulse: 60  Resp: 17  Temp: 97.8 F (36.6 C)  SpO2: 99%   Filed Weights   04/16/21 1127  Weight: 141 lb 3.2 oz (64 kg)    GENERAL:alert, no distress and comfortable SKIN: skin color, texture, turgor are normal, no rashes or significant lesions EYES: normal, Conjunctiva are pink and non-injected, sclera clear OROPHARYNX:no exudate, no erythema and lips, buccal mucosa, and tongue normal  NECK: supple, thyroid normal size, non-tender, without nodularity LYMPH:  no palpable lymphadenopathy in the cervical, axillary or inguinal LUNGS: clear to auscultation and percussion with normal breathing effort HEART: regular rate & rhythm and no murmurs and no lower extremity edema ABDOMEN:abdomen soft, non-tender and normal bowel sounds Musculoskeletal:no cyanosis of digits and no clubbing  NEURO: alert & oriented x 3 with fluent speech, no focal motor/sensory deficits  LABORATORY DATA:  I have reviewed the data as listed    Component Value Date/Time   NA 137 02/08/2019 2148   NA 141 04/15/2017 0818   K 3.3 (L) 02/08/2019 2148   K 4.8 04/15/2017 0818   CL 101 02/08/2019 2148   CO2 25 02/08/2019 2148   CO2 29 04/15/2017 0818   GLUCOSE 112 (H) 02/08/2019 2148   GLUCOSE 96 04/15/2017 0818   BUN <5 (L)  02/08/2019 2148   BUN 6.8 (L) 04/15/2017 0818   CREATININE 0.72 02/08/2019 2148   CREATININE 0.8 04/15/2017 0818   CALCIUM 9.2 02/08/2019 2148   CALCIUM 9.9 04/15/2017 0818   PROT 6.7 01/28/2019 1313   PROT 7.0 04/15/2017 0818   ALBUMIN 3.8 01/28/2019 1313   ALBUMIN 4.1 04/15/2017 0818   AST 21 01/28/2019 1313   AST 20 04/15/2017 0818   ALT 12 01/28/2019 1313   ALT 15 04/15/2017 0818   ALKPHOS 63 01/28/2019 1313   ALKPHOS 60 04/15/2017 0818   BILITOT 0.7 01/28/2019  1313   BILITOT 0.65 04/15/2017 0818   GFRNONAA >60 02/08/2019 2148   GFRAA >60 02/08/2019 2148    No results found for: SPEP, UPEP  Lab Results  Component Value Date   WBC 2.5 (L) 04/16/2021   NEUTROABS 1.3 (L) 04/16/2021   HGB 12.4 04/16/2021   HCT 37.9 04/16/2021   MCV 90.2 04/16/2021   PLT 198 04/16/2021      Chemistry      Component Value Date/Time   NA 137 02/08/2019 2148   NA 141 04/15/2017 0818   K 3.3 (L) 02/08/2019 2148   K 4.8 04/15/2017 0818   CL 101 02/08/2019 2148   CO2 25 02/08/2019 2148   CO2 29 04/15/2017 0818   BUN <5 (L) 02/08/2019 2148   BUN 6.8 (L) 04/15/2017 0818   CREATININE 0.72 02/08/2019 2148   CREATININE 0.8 04/15/2017 0818      Component Value Date/Time   CALCIUM 9.2 02/08/2019 2148   CALCIUM 9.9 04/15/2017 0818   ALKPHOS 63 01/28/2019 1313   ALKPHOS 60 04/15/2017 0818   AST 21 01/28/2019 1313   AST 20 04/15/2017 0818   ALT 12 01/28/2019 1313   ALT 15 04/15/2017 0818   BILITOT 0.7 01/28/2019 1313   BILITOT 0.65 04/15/2017 0818

## 2021-04-16 NOTE — Assessment & Plan Note (Signed)
She continues to have persistent chronic leukopenia, likely constitutional in nature Vitamin B12 level is adequate She does not need long-term follow-up of further investigation

## 2021-04-16 NOTE — Assessment & Plan Note (Signed)
She had recurrent herpes simplex virus reactivation  She will continue acyclovir indefinitely

## 2021-04-16 NOTE — Assessment & Plan Note (Signed)
She is a long-term cancer survivor Her examination is benign I do not plan to order surveillance imaging study in the absence of disease I educated the patient signs and symptoms to watch out for cancer recurrence She does not need long-term follow-up

## 2021-05-27 DIAGNOSIS — R21 Rash and other nonspecific skin eruption: Secondary | ICD-10-CM | POA: Diagnosis not present

## 2021-05-27 DIAGNOSIS — L299 Pruritus, unspecified: Secondary | ICD-10-CM | POA: Diagnosis not present

## 2021-05-29 ENCOUNTER — Telehealth: Payer: Self-pay | Admitting: Dermatology

## 2021-05-29 NOTE — Telephone Encounter (Signed)
Referral attached to appointment

## 2021-05-29 NOTE — Telephone Encounter (Signed)
Patient is calling for a referral appointment from Merrilee Seashore, MD.  Patient is scheduled for 12/04/2021 at 9:00 with Lavonna Monarch, MD.

## 2021-06-06 ENCOUNTER — Telehealth: Payer: Self-pay

## 2021-06-06 NOTE — Telephone Encounter (Signed)
She called and left a message that she would like appt next week due to a rash on her legs.  Called back and ask her to call the office to schedule appt on 6/23.

## 2021-06-07 NOTE — Telephone Encounter (Signed)
Called back and scheduled appt. She is aware of appt time.

## 2021-06-08 DIAGNOSIS — L03116 Cellulitis of left lower limb: Secondary | ICD-10-CM | POA: Diagnosis not present

## 2021-06-13 ENCOUNTER — Inpatient Hospital Stay: Payer: Medicare Other | Attending: Hematology and Oncology | Admitting: Hematology and Oncology

## 2021-06-13 ENCOUNTER — Other Ambulatory Visit: Payer: Self-pay

## 2021-06-13 ENCOUNTER — Encounter: Payer: Self-pay | Admitting: Hematology and Oncology

## 2021-06-13 DIAGNOSIS — Z923 Personal history of irradiation: Secondary | ICD-10-CM | POA: Insufficient documentation

## 2021-06-13 DIAGNOSIS — L989 Disorder of the skin and subcutaneous tissue, unspecified: Secondary | ICD-10-CM | POA: Insufficient documentation

## 2021-06-13 DIAGNOSIS — Z8572 Personal history of non-Hodgkin lymphomas: Secondary | ICD-10-CM | POA: Insufficient documentation

## 2021-06-13 NOTE — Progress Notes (Signed)
[facility] [place] [facility] OFFICE PROGRESS NOTE  Patient Care Team: Jani Gravel, MD as PCP - General (Internal Medicine) [name], MD as Consulting Physician (Hematology and Oncology) [name], MD as Consulting Physician (Otolaryngology)  ASSESSMENT & PLAN:  Skin lesions She has nonspecific cluster of skin lesions on both legs The appearance is most consistent with some sort of insect bite I agree with assessment by her primary care doctor and recommend she continues and complete her course of antibiotics She has appointment scheduled to see dermatologist and I think it is important for her to keep the appointment She is reassured  History of B-cell lymphoma I reassured the patient that the appearance on the skin lesions is not due to lymphoma recurrence  No orders of the defined types were placed in this encounter.   All questions were answered. The patient knows to call the clinic with any problems, questions or concerns. The total time spent in the appointment was 20 minutes encounter with patients including review of chart and various tests results, discussions about plan of care and coordination of care plan   [name], MD 06/13/2021 9:58 AM  INTERVAL HISTORY: Please see below for problem oriented charting. She is seen urgently because of new skin lesion that developed recently She had it on both areas near her ankles It is not itchy She was prescribed oral antibiotics and she has been putting some peroxide over it which I have told her to stop The skin does not look ulcerated She has appointment to see dermatologist She have no fever or chills  SUMMARY OF ONCOLOGIC HISTORY: Oncology History  History of B-cell lymphoma  01/31/2015 Imaging   CT scan of the neck showed large mass centered around the right parapharyngeal space, measuring approximately 3.6 cm with bulky lymphadenopathy on the right side. Incidentally 2 nodules were found.    02/05/2015 Pathology Results    Accession: [id number] biopsy confirmed diffuse large B-cell lymphoma    02/05/2015 Surgery   She had excisional lymph node biopsy of the right neck mass    02/16/2015 Imaging    echocardiogram showed preserved ejection fraction.    02/19/2015 Bone Marrow Biopsy   Accession: [id number] BM biopsy was negative for cancer    02/21/2015 Imaging    she had PET CT scan which showed stage II disease.    02/26/2015 Procedure    she had placement of Infuse-a-Port.    03/01/2015 - 04/20/2015 Chemotherapy    she received 3 cycles of R-CHOP chemotherapy with 2 cycles of IT methotrexate    03/22/2015 Adverse Reaction   Cycle 2 of RCHOP chemotherapy is delayed due to neutropenia     05/10/2015 Imaging   PET scan showed positive response to treatment    06/11/2015 - 07/09/2015 Radiation Therapy   Helical IMRT Tomotherapy to R neck:  40 Gy at 2 Gy per fraction x 20 fractions.    10/08/2015 Imaging   PET scan showed no evidence of disease recurrence    10/18/2015 Procedure   Port-a-cath removed.    04/07/2016 PET scan   Mild increase in FDG uptake associated with the right hilum. The degree of uptake is nonspecific currently 4.08. This area warrants attention on follow-up imaging.    08/07/2016 Imaging   CT scan similar right-sided ground-glass and ill-defined pulmonary nodules    04/28/2017 Imaging   CT chest: 1. Stable appearance ground-glass nodules right upper lobe measuring up to 7 mm. 2. 7 mm sub solid nodule superior segment  right lower lobe is stable. 3. Additional follow-up with CT at 12 months is recommended. If persistent, repeat CT is recommended every 2 years until 5 years of stability has been established.     04/28/2017 Imaging   CT neck: 1. Subcentimeter lymph nodes in the upper neck without pathologic enlargement to suggest residual or recurrent disease. 2. Stranding of the right parapharyngeal fat likely reflects posttreatment change without residual or recurrent disease. 3.  Mild degenerative changes in the upper cervical spine.    04/28/2018 Imaging   1. Stable appearance of sub solid nodules measuring up to 7 mm. Repeat CT is recommended every 2 years until 5 years of stability has been established. This recommendation follows theconsensus statement: Guidelines for Management of Incidental pulmonary Nodules Detected on CT Images: From the Fleischner Society 2017; Radiology 2017; 284:228-243. 2. Unchanged appearance of solid subpleural nodule in the posterior right upper lobe and tiny left upper lobe lung nodules, likely representing a benign abnormality or no further follow-up. 3. Aortic Atherosclerosis (ICD10-I70.0). LAD and left main coronary artery atherosclerotic calcifications.      REVIEW OF SYSTEMS:   Constitutional: Denies fevers, chills or abnormal weight loss Eyes: Denies blurriness of vision Ears, nose, mouth, throat, and face: Denies mucositis or sore throat Respiratory: Denies cough, dyspnea or wheezes Cardiovascular: Denies palpitation, chest discomfort or lower extremity swelling Gastrointestinal:  Denies nausea, heartburn or change in bowel habits Lymphatics: Denies new lymphadenopathy or easy bruising Neurological:Denies numbness, tingling or new weaknesses Behavioral/Psych: Mood is stable, no new changes  All other systems were reviewed with the patient and are negative.  I have reviewed the past medical history, past surgical history, social history and family history with the patient and they are unchanged from previous note.  ALLERGIES:  is allergic to erythromycin, hydrocodone, peanut-containing drug products, and biaxin [clarithromycin].  MEDICATIONS:  Current Outpatient Medications  Medication Sig Dispense Refill   acyclovir (ZOVIRAX) 400 MG tablet Take 1 tablet (400 mg total) by mouth daily at 2 PM. 90 tablet 3   cholecalciferol (VITAMIN D) 1000 units tablet Take 1,000 Units by mouth every evening.      Iron Combinations (IRON  COMPLEX PO) Take 1 tablet by mouth every evening.      No current facility-administered medications for this visit.    PHYSICAL EXAMINATION: ECOG PERFORMANCE STATUS: 1 - Symptomatic but completely ambulatory  Vitals:   06/13/21 0905  BP: 106/61  Pulse: (!) 58  Resp: 18  Temp: (!) 97 F (36.1 C)  SpO2: 99%   Filed Weights   06/13/21 0905  Weight: 141 lb 3.2 oz (64 kg)    GENERAL:alert, no distress and comfortable SKIN: She has cluster of darkened skin lesions on both legs but nowhere else.  It does not look like cellulitis NEURO: alert & oriented x 3 with fluent speech, no focal motor/sensory deficits  LABORATORY DATA:  I have reviewed the data as listed    Component Value Date/Time   NA 137 02/08/2019 2148   NA 141 04/15/2017 0818   K 3.3 (L) 02/08/2019 2148   K 4.8 04/15/2017 0818   CL 101 02/08/2019 2148   CO2 25 02/08/2019 2148   CO2 29 04/15/2017 0818   GLUCOSE 112 (H) 02/08/2019 2148   GLUCOSE 96 04/15/2017 0818   BUN <5 (L) 02/08/2019 2148   BUN 6.8 (L) 04/15/2017 0818   CREATININE 0.72 02/08/2019 2148   CREATININE 0.8 04/15/2017 0818   CALCIUM 9.2 02/08/2019 2148   CALCIUM 9.9  04/15/2017 0818   PROT 6.7 01/28/2019 1313   PROT 7.0 04/15/2017 0818   ALBUMIN 3.8 01/28/2019 1313   ALBUMIN 4.1 04/15/2017 0818   AST 21 01/28/2019 1313   AST 20 04/15/2017 0818   ALT 12 01/28/2019 1313   ALT 15 04/15/2017 0818   ALKPHOS 63 01/28/2019 1313   ALKPHOS 60 04/15/2017 0818   BILITOT 0.7 01/28/2019 1313   BILITOT 0.65 04/15/2017 0818   GFRNONAA >60 02/08/2019 2148   GFRAA >60 02/08/2019 2148    No results found for: SPEP, UPEP  Lab Results  Component Value Date   WBC 2.5 (L) 04/16/2021   NEUTROABS 1.3 (L) 04/16/2021   HGB 12.4 04/16/2021   HCT 37.9 04/16/2021   MCV 90.2 04/16/2021   PLT 198 04/16/2021      Chemistry      Component Value Date/Time   NA 137 02/08/2019 2148   NA 141 04/15/2017 0818   K 3.3 (L) 02/08/2019 2148   K 4.8 04/15/2017 0818    CL 101 02/08/2019 2148   CO2 25 02/08/2019 2148   CO2 29 04/15/2017 0818   BUN <5 (L) 02/08/2019 2148   BUN 6.8 (L) 04/15/2017 0818   CREATININE 0.72 02/08/2019 2148   CREATININE 0.8 04/15/2017 0818      Component Value Date/Time   CALCIUM 9.2 02/08/2019 2148   CALCIUM 9.9 04/15/2017 0818   ALKPHOS 63 01/28/2019 1313   ALKPHOS 60 04/15/2017 0818   AST 21 01/28/2019 1313   AST 20 04/15/2017 0818   ALT 12 01/28/2019 1313   ALT 15 04/15/2017 0818   BILITOT 0.7 01/28/2019 1313   BILITOT 0.65 04/15/2017 0818

## 2021-06-13 NOTE — Assessment & Plan Note (Signed)
I reassured the patient that the appearance on the skin lesions is not due to lymphoma recurrence

## 2021-06-13 NOTE — Assessment & Plan Note (Signed)
She has nonspecific cluster of skin lesions on both legs The appearance is most consistent with some sort of insect bite I agree with assessment by her primary care doctor and recommend she continues and complete her course of antibiotics She has appointment scheduled to see dermatologist and I think it is important for her to keep the appointment She is reassured

## 2021-07-05 DIAGNOSIS — L258 Unspecified contact dermatitis due to other agents: Secondary | ICD-10-CM | POA: Diagnosis not present

## 2021-08-27 ENCOUNTER — Telehealth: Payer: Self-pay

## 2021-08-27 ENCOUNTER — Other Ambulatory Visit: Payer: Self-pay | Admitting: Hematology and Oncology

## 2021-08-27 DIAGNOSIS — L03116 Cellulitis of left lower limb: Secondary | ICD-10-CM | POA: Diagnosis not present

## 2021-08-27 NOTE — Telephone Encounter (Signed)
-----   Message from [name], MD sent at 08/27/2021 11:45 AM EDT ----- Pharmacy requested refill on acyclovir I refilled it one more time She can stop after that, please let her know

## 2021-08-27 NOTE — Telephone Encounter (Signed)
Called and given below message. She verbalized understanding. 

## 2021-11-06 DIAGNOSIS — C8590 Non-Hodgkin lymphoma, unspecified, unspecified site: Secondary | ICD-10-CM | POA: Diagnosis not present

## 2021-11-06 DIAGNOSIS — E559 Vitamin D deficiency, unspecified: Secondary | ICD-10-CM | POA: Diagnosis not present

## 2021-11-06 DIAGNOSIS — R202 Paresthesia of skin: Secondary | ICD-10-CM | POA: Diagnosis not present

## 2021-11-11 DIAGNOSIS — Z0000 Encounter for general adult medical examination without abnormal findings: Secondary | ICD-10-CM | POA: Diagnosis not present

## 2021-12-04 ENCOUNTER — Ambulatory Visit: Payer: Medicare Other | Admitting: Dermatology

## 2022-01-27 ENCOUNTER — Telehealth: Payer: Self-pay

## 2022-01-27 ENCOUNTER — Other Ambulatory Visit: Payer: Self-pay

## 2022-01-27 MED ORDER — VALACYCLOVIR HCL 1 G PO TABS: 1000.0000 mg | ORAL_TABLET | Freq: Three times a day (TID) | ORAL | 0 refills | Status: AC

## 2022-01-27 NOTE — Telephone Encounter (Signed)
Called and given below message. She verbalized understanding. She will stop the Acyclovir and start Valtrex for 7 days. Rx sent to her pharmacy.

## 2022-01-27 NOTE — Telephone Encounter (Signed)
She called and left a message. She had a herpes out break Friday. She restarted the Acyclovir Friday. She stopped the Acyclovir in October. She is asking what you recommend.

## 2022-01-27 NOTE — Telephone Encounter (Signed)
Pls call in valtrex 1g TID PO x 7 days I have nothing to add, recurrent herpes outbreak can happen to anyone

## 2022-01-28 ENCOUNTER — Other Ambulatory Visit: Payer: Self-pay | Admitting: Hematology and Oncology

## 2022-05-20 ENCOUNTER — Telehealth: Payer: Self-pay

## 2022-05-20 ENCOUNTER — Other Ambulatory Visit: Payer: Self-pay | Admitting: Hematology and Oncology

## 2022-05-20 NOTE — Telephone Encounter (Signed)
Called and given below message. She verbalized understanding and will follow up with PCP.

## 2022-05-20 NOTE — Telephone Encounter (Signed)
She is a long term cancer survivor, 7 years out I recommend discharge from the clinic and follow up with PCP only

## 2022-05-20 NOTE — Telephone Encounter (Signed)
She called and left a message asking when she needs to follow up with Dr. Alvy Bimler.  I do not see appt scheduled.

## 2022-05-22 ENCOUNTER — Other Ambulatory Visit: Payer: Self-pay

## 2022-05-22 ENCOUNTER — Telehealth: Payer: Self-pay

## 2022-05-22 MED ORDER — ACYCLOVIR 400 MG PO TABS: ORAL_TABLET | ORAL | 0 refills | Status: DC

## 2022-05-22 NOTE — Telephone Encounter (Signed)
Returned her call and scheduled appt at 11 am on 6/9. She is requesting refill of Acyclovir Rx, sent to her preferred pharmacy.

## 2022-05-30 ENCOUNTER — Other Ambulatory Visit: Payer: Self-pay

## 2022-05-30 ENCOUNTER — Encounter: Payer: Self-pay | Admitting: Hematology and Oncology

## 2022-05-30 ENCOUNTER — Inpatient Hospital Stay: Payer: Medicare Other | Attending: Hematology and Oncology | Admitting: Hematology and Oncology

## 2022-05-30 DIAGNOSIS — B009 Herpesviral infection, unspecified: Secondary | ICD-10-CM | POA: Insufficient documentation

## 2022-05-30 DIAGNOSIS — Z8572 Personal history of non-Hodgkin lymphomas: Secondary | ICD-10-CM | POA: Insufficient documentation

## 2022-05-30 MED ORDER — VALACYCLOVIR HCL 1 G PO TABS: 1000.0000 mg | ORAL_TABLET | Freq: Two times a day (BID) | ORAL | 1 refills | Status: AC

## 2022-05-30 NOTE — Assessment & Plan Note (Signed)
I recommend discontinuation of acyclovir and prescribed valtrex as needed She can get future refills with PCP

## 2022-05-30 NOTE — Assessment & Plan Note (Signed)
She has no signs or symptoms of recurrence She does not need long term follow-up

## 2022-05-30 NOTE — Progress Notes (Signed)
[facility] OFF[facility] PROGRESS NOTE  Patient Care Team: Jani Gravel, MD as PCP - General (Internal Medicine) [name], MD as Consulting Physician (Hematology and Oncology) [name], MD as Consulting Physician (Otolaryngology)  ASSESSMENT & PLAN:  History of B-cell lymphoma She has no signs or symptoms of recurrence She does not need long term follow-up  Recurrent herpes simplex I recommend discontinuation of acyclovir and prescribed valtrex as needed She can get future refills with PCP  No orders of the defined types were placed in this encounter.   All questions were answered. The patient knows to call the clinic with any problems, questions or concerns. The total time spent in the appointment was 20 minutes encounter with patients including review of chart and various tests results, discussions about plan of care and coordination of care plan   [name], MD 05/30/2022 1:24 PM  INTERVAL HISTORY: Please see below for problem oriented charting. she returns for further evaluation She has history of lymphoma and recurrent herpes simplex  She is concerned about discontinuation of acyclovir longer term She had recent outbreak No new lymphadenoapthy  REVIEW OF SYSTEMS:   Constitutional: Denies fevers, chills or abnormal weight loss Eyes: Denies blurriness of vision Ears, nose, mouth, throat, and face: Denies mucositis or sore throat Respiratory: Denies cough, dyspnea or wheezes Cardiovascular: Denies palpitation, chest discomfort or lower extremity swelling Gastrointestinal:  Denies nausea, heartburn or change in bowel habits Skin: Denies abnormal skin rashes Lymphatics: Denies new lymphadenopathy or easy bruising Neurological:Denies numbness, tingling or new weaknesses Behavioral/Psych: Mood is stable, no new changes  All other systems were reviewed with the patient and are negative.  I have reviewed the past medical history, past surgical history, social  history and family history with the patient and they are unchanged from previous note.  ALLERGIES:  is allergic to erythromycin, hydrocodone, peanut-containing drug products, and biaxin [clarithromycin].  MEDICATIONS:  Current Outpatient Medications  Medication Sig Dispense Refill   valACYclovir (VALTREX) 1000 MG tablet Take 1 tablet (1,000 mg total) by mouth 2 (two) times daily. 20 tablet 1   cholecalciferol (VITAMIN D) 1000 units tablet Take 1,000 Units by mouth every evening.      No current facility-administered medications for this visit.    SUMMARY OF ONCOLOGIC HISTORY: Oncology History  History of B-cell lymphoma  01/31/2015 Imaging   CT scan of the neck showed large mass centered around the right parapharyngeal space, measuring approximately 3.6 cm with bulky lymphadenopathy on the right side. Incidentally 2 nodules were found.   02/05/2015 Pathology Results   Accession: [id number] biopsy confirmed diffuse large B-cell lymphoma   02/05/2015 Surgery   She had excisional lymph node biopsy of the right neck mass   02/16/2015 Imaging    echocardiogram showed preserved ejection fraction.   02/19/2015 Bone Marrow Biopsy   Accession: [id number] BM biopsy was negative for cancer   02/21/2015 Imaging    she had PET CT scan which showed stage II disease.   02/26/2015 Procedure    she had placement of Infuse-a-Port.   03/01/2015 - 04/20/2015 Chemotherapy    she received 3 cycles of R-CHOP chemotherapy with 2 cycles of IT methotrexate   03/22/2015 Adverse Reaction   Cycle 2 of RCHOP chemotherapy is delayed due to neutropenia    05/10/2015 Imaging   PET scan showed positive response to treatment   06/11/2015 - 07/09/2015 Radiation Therapy   Helical IMRT Tomotherapy to R neck:  40 Gy at 2 Gy per fraction x  20 fractions.   10/08/2015 Imaging   PET scan showed no evidence of disease recurrence   10/18/2015 Procedure   Port-a-cath removed.   04/07/2016 PET scan   Mild increase in FDG  uptake associated with the right hilum. The degree of uptake is nonspecific currently 4.08. This area warrants attention on follow-up imaging.   08/07/2016 Imaging   CT scan similar right-sided ground-glass and ill-defined pulmonary nodules   04/28/2017 Imaging   CT chest: 1. Stable appearance ground-glass nodules right upper lobe measuring up to 7 mm. 2. 7 mm sub solid nodule superior segment right lower lobe is stable. 3. Additional follow-up with CT at 12 months is recommended. If persistent, repeat CT is recommended every 2 years until 5 years of stability has been established.    04/28/2017 Imaging   CT neck: 1. Subcentimeter lymph nodes in the upper neck without pathologic enlargement to suggest residual or recurrent disease. 2. Stranding of the right parapharyngeal fat likely reflects posttreatment change without residual or recurrent disease. 3. Mild degenerative changes in the upper cervical spine.   04/28/2018 Imaging   1. Stable appearance of sub solid nodules measuring up to 7 mm. Repeat CT is recommended every 2 years until 5 years of stability has been established. This recommendation follows theconsensus statement: Guidelines for Management of Incidental pulmonary Nodules Detected on CT Images: From the Fleischner Society 2017; Radiology 2017; 284:228-243. 2. Unchanged appearance of solid subpleural nodule in the posterior right upper lobe and tiny left upper lobe lung nodules, likely representing a benign abnormality or no further follow-up. 3. Aortic Atherosclerosis (ICD10-I70.0). LAD and left main coronary artery atherosclerotic calcifications.     PHYSICAL EXAMINATION: ECOG PERFORMANCE STATUS: 0 - Asymptomatic  Vitals:   05/30/22 1118  BP: (!) 111/59  Pulse: (!) 56  Resp: 18  Temp: 98.4 F (36.9 C)  SpO2: 97%   Filed Weights   05/30/22 1118  Weight: 146 lb (66.2 kg)    GENERAL:alert, no distress and comfortable NEURO: alert & oriented x 3 with fluent speech, no focal  motor/sensory deficits  LABORATORY DATA:  I have reviewed the data as listed    Component Value Date/Time   NA 137 02/08/2019 2148   NA 141 04/15/2017 0818   K 3.3 (L) 02/08/2019 2148   K 4.8 04/15/2017 0818   CL 101 02/08/2019 2148   CO2 25 02/08/2019 2148   CO2 29 04/15/2017 0818   GLUCOSE 112 (H) 02/08/2019 2148   GLUCOSE 96 04/15/2017 0818   BUN <5 (L) 02/08/2019 2148   BUN 6.8 (L) 04/15/2017 0818   CREATININE 0.72 02/08/2019 2148   CREATININE 0.8 04/15/2017 0818   CALCIUM 9.2 02/08/2019 2148   CALCIUM 9.9 04/15/2017 0818   PROT 6.7 01/28/2019 1313   PROT 7.0 04/15/2017 0818   ALBUMIN 3.8 01/28/2019 1313   ALBUMIN 4.1 04/15/2017 0818   AST 21 01/28/2019 1313   AST 20 04/15/2017 0818   ALT 12 01/28/2019 1313   ALT 15 04/15/2017 0818   ALKPHOS 63 01/28/2019 1313   ALKPHOS 60 04/15/2017 0818   BILITOT 0.7 01/28/2019 1313   BILITOT 0.65 04/15/2017 0818   GFRNONAA >60 02/08/2019 2148   GFRAA >60 02/08/2019 2148    No results found for: "SPEP", "UPEP"  Lab Results  Component Value Date   WBC 2.5 (L) 04/16/2021   NEUTROABS 1.3 (L) 04/16/2021   HGB 12.4 04/16/2021   HCT 37.9 04/16/2021   MCV 90.2 04/16/2021   PLT 198 04/16/2021  Chemistry      Component Value Date/Time   NA 137 02/08/2019 2148   NA 141 04/15/2017 0818   K 3.3 (L) 02/08/2019 2148   K 4.8 04/15/2017 0818   CL 101 02/08/2019 2148   CO2 25 02/08/2019 2148   CO2 29 04/15/2017 0818   BUN <5 (L) 02/08/2019 2148   BUN 6.8 (L) 04/15/2017 0818   CREATININE 0.72 02/08/2019 2148   CREATININE 0.8 04/15/2017 0818      Component Value Date/Time   CALCIUM 9.2 02/08/2019 2148   CALCIUM 9.9 04/15/2017 0818   ALKPHOS 63 01/28/2019 1313   ALKPHOS 60 04/15/2017 0818   AST 21 01/28/2019 1313   AST 20 04/15/2017 0818   ALT 12 01/28/2019 1313   ALT 15 04/15/2017 0818   BILITOT 0.7 01/28/2019 1313   BILITOT 0.65 04/15/2017 0818

## 2022-10-03 DIAGNOSIS — Z23 Encounter for immunization: Secondary | ICD-10-CM | POA: Diagnosis not present

## 2022-10-07 ENCOUNTER — Telehealth: Payer: Self-pay

## 2022-10-07 ENCOUNTER — Other Ambulatory Visit: Payer: Self-pay | Admitting: Hematology and Oncology

## 2022-10-07 NOTE — Telephone Encounter (Signed)
-----   Message from [name], MD sent at 10/07/2022 11:01 AM EDT ----- I told her in June to DC acyclovir Now I receive another refill request from her pharmacy Can you call her and remind her of our conversation?

## 2022-10-07 NOTE — Telephone Encounter (Signed)
Called and given below message. She verbalized understanding and appreciated the call. She will contact PCP for Valtrex refill.

## 2022-10-23 ENCOUNTER — Other Ambulatory Visit: Payer: Self-pay | Admitting: Hematology and Oncology

## 2022-11-17 DIAGNOSIS — H00021 Hordeolum internum right upper eyelid: Secondary | ICD-10-CM | POA: Diagnosis not present

## 2022-12-18 DIAGNOSIS — E559 Vitamin D deficiency, unspecified: Secondary | ICD-10-CM | POA: Diagnosis not present

## 2022-12-18 DIAGNOSIS — H1013 Acute atopic conjunctivitis, bilateral: Secondary | ICD-10-CM | POA: Diagnosis not present

## 2022-12-18 DIAGNOSIS — R5383 Other fatigue: Secondary | ICD-10-CM | POA: Diagnosis not present

## 2022-12-18 DIAGNOSIS — C8590 Non-Hodgkin lymphoma, unspecified, unspecified site: Secondary | ICD-10-CM | POA: Diagnosis not present

## 2022-12-18 DIAGNOSIS — Z79899 Other long term (current) drug therapy: Secondary | ICD-10-CM | POA: Diagnosis not present

## 2022-12-24 DIAGNOSIS — Z800 Family history of malignant neoplasm of digestive organs: Secondary | ICD-10-CM | POA: Diagnosis not present

## 2022-12-24 DIAGNOSIS — Z0000 Encounter for general adult medical examination without abnormal findings: Secondary | ICD-10-CM | POA: Diagnosis not present

## 2023-02-12 DIAGNOSIS — M791 Myalgia, unspecified site: Secondary | ICD-10-CM | POA: Diagnosis not present

## 2023-03-05 DIAGNOSIS — Z800 Family history of malignant neoplasm of digestive organs: Secondary | ICD-10-CM | POA: Diagnosis not present

## 2023-03-05 DIAGNOSIS — Z1211 Encounter for screening for malignant neoplasm of colon: Secondary | ICD-10-CM | POA: Diagnosis not present

## 2023-03-05 DIAGNOSIS — K648 Other hemorrhoids: Secondary | ICD-10-CM | POA: Diagnosis not present

## 2023-03-05 DIAGNOSIS — D123 Benign neoplasm of transverse colon: Secondary | ICD-10-CM | POA: Diagnosis not present

## 2023-03-09 DIAGNOSIS — D123 Benign neoplasm of transverse colon: Secondary | ICD-10-CM | POA: Diagnosis not present

## 2023-03-27 DIAGNOSIS — M545 Low back pain, unspecified: Secondary | ICD-10-CM | POA: Diagnosis not present

## 2023-03-27 DIAGNOSIS — M791 Myalgia, unspecified site: Secondary | ICD-10-CM | POA: Diagnosis not present

## 2023-04-09 DIAGNOSIS — M25552 Pain in left hip: Secondary | ICD-10-CM | POA: Diagnosis not present

## 2023-04-09 DIAGNOSIS — M791 Myalgia, unspecified site: Secondary | ICD-10-CM | POA: Diagnosis not present

## 2023-04-22 DIAGNOSIS — M25559 Pain in unspecified hip: Secondary | ICD-10-CM | POA: Diagnosis not present

## 2023-04-22 DIAGNOSIS — M545 Low back pain, unspecified: Secondary | ICD-10-CM | POA: Diagnosis not present

## 2023-04-24 DIAGNOSIS — M545 Low back pain, unspecified: Secondary | ICD-10-CM | POA: Diagnosis not present

## 2023-04-24 DIAGNOSIS — M25559 Pain in unspecified hip: Secondary | ICD-10-CM | POA: Diagnosis not present

## 2023-04-30 DIAGNOSIS — M8588 Other specified disorders of bone density and structure, other site: Secondary | ICD-10-CM | POA: Diagnosis not present

## 2023-04-30 DIAGNOSIS — Z1231 Encounter for screening mammogram for malignant neoplasm of breast: Secondary | ICD-10-CM | POA: Diagnosis not present

## 2023-04-30 DIAGNOSIS — M8580 Other specified disorders of bone density and structure, unspecified site: Secondary | ICD-10-CM | POA: Diagnosis not present

## 2023-04-30 DIAGNOSIS — Z6824 Body mass index (BMI) 24.0-24.9, adult: Secondary | ICD-10-CM | POA: Diagnosis not present

## 2023-05-04 DIAGNOSIS — I7300 Raynaud's syndrome without gangrene: Secondary | ICD-10-CM | POA: Diagnosis not present

## 2023-05-04 DIAGNOSIS — M1990 Unspecified osteoarthritis, unspecified site: Secondary | ICD-10-CM | POA: Diagnosis not present

## 2023-05-04 DIAGNOSIS — M2550 Pain in unspecified joint: Secondary | ICD-10-CM | POA: Diagnosis not present

## 2023-05-04 DIAGNOSIS — M549 Dorsalgia, unspecified: Secondary | ICD-10-CM | POA: Diagnosis not present

## 2023-05-04 DIAGNOSIS — R768 Other specified abnormal immunological findings in serum: Secondary | ICD-10-CM | POA: Diagnosis not present

## 2023-05-11 DIAGNOSIS — M545 Low back pain, unspecified: Secondary | ICD-10-CM | POA: Diagnosis not present

## 2023-05-11 DIAGNOSIS — M25559 Pain in unspecified hip: Secondary | ICD-10-CM | POA: Diagnosis not present

## 2023-05-12 DIAGNOSIS — M545 Low back pain, unspecified: Secondary | ICD-10-CM | POA: Diagnosis not present

## 2023-05-12 DIAGNOSIS — M25559 Pain in unspecified hip: Secondary | ICD-10-CM | POA: Diagnosis not present

## 2023-08-28 DIAGNOSIS — Z23 Encounter for immunization: Secondary | ICD-10-CM | POA: Diagnosis not present

## 2023-10-21 ENCOUNTER — Telehealth: Payer: Self-pay | Admitting: *Deleted

## 2023-10-21 NOTE — Telephone Encounter (Signed)
Pt called to report changes in small bump under her left breast. She states area has changed. It is usually a small dark/black area "almost like a blackhead". It is now slightly swollen and has a lighter area surrounding it. Denies, pain or drainage. She wants to know if Dr. Bertis Ruddy would check it for her. Dr. Bertis Ruddy informed. Contacted patient with Dr. Maxine Glenn response. LVM: Please contact PCP for appointment. PCP can assess/evaluate then order mammogram or other tests and referrals as appropriate.

## 2023-10-23 DIAGNOSIS — L700 Acne vulgaris: Secondary | ICD-10-CM | POA: Diagnosis not present

## 2023-12-17 DIAGNOSIS — M25562 Pain in left knee: Secondary | ICD-10-CM | POA: Diagnosis not present

## 2023-12-24 DIAGNOSIS — C8590 Non-Hodgkin lymphoma, unspecified, unspecified site: Secondary | ICD-10-CM | POA: Diagnosis not present

## 2023-12-24 DIAGNOSIS — E559 Vitamin D deficiency, unspecified: Secondary | ICD-10-CM | POA: Diagnosis not present

## 2023-12-24 DIAGNOSIS — Z0000 Encounter for general adult medical examination without abnormal findings: Secondary | ICD-10-CM | POA: Diagnosis not present

## 2024-01-01 ENCOUNTER — Ambulatory Visit: Payer: Medicare Other | Admitting: Family Medicine

## 2024-01-01 ENCOUNTER — Other Ambulatory Visit: Payer: Self-pay

## 2024-01-01 VITALS — BP 132/86 | Ht 63.5 in | Wt 155.0 lb

## 2024-01-01 DIAGNOSIS — M25562 Pain in left knee: Secondary | ICD-10-CM | POA: Diagnosis not present

## 2024-01-01 DIAGNOSIS — G8929 Other chronic pain: Secondary | ICD-10-CM | POA: Diagnosis not present

## 2024-01-01 NOTE — Progress Notes (Addendum)
   LILLETTE Ileana Collet, PhD, LAT, ATC acting as a scribe for Artist Lloyd, MD.  Tabitha Jackson is a 78 y.o. female who presents to [facility] today for left knee pain x 23month+. PCP did an XR. Pain is located to varying locations along the anterior aspect of L knee. No falls or injury.   L Knee swelling: yes Mechanical symptoms: yes Aggravates: prolong sitting, worse at night Treatments tried: meloxicam, hot bath  Pertinent review of systems: No fevers or chills  Relevant historical information: History of B-cell lymphoma   Exam:  BP 132/86   Ht 5' 3.5 (1.613 m)   Wt 155 lb (70.3 kg)   BMI 27.03 kg/m  General: Well Developed, well nourished, and in no acute distress.   MSK: Left knee moderate effusion normal motion with crepitation.    Lab and Radiology Results  Procedure: Real-time Ultrasound Guided Injection of left knee joint superior lateral patella space Device: Philips Affiniti 50G/GE Logiq Images permanently stored and available for review in PACS Verbal informed consent obtained.  Discussed risks and benefits of procedure. Warned about infection, bleeding, hyperglycemia damage to structures among others. Patient expresses understanding and agreement Time-out conducted.   Noted no overlying erythema, induration, or other signs of local infection.   Skin prepped in a sterile fashion.   Local anesthesia: Topical Ethyl chloride.   With sterile technique and under real time ultrasound guidance: 40 mg of Kenalog and 2 mL of Marcaine injected into knee joint. Fluid seen entering the joint capsule.   Completed without difficulty   Pain immediately resolved suggesting accurate placement of the medication.   Advised to call if fevers/chills, erythema, induration, drainage, or persistent bleeding.   Images permanently stored and available for review in the ultrasound unit.  Impression: Technically successful ultrasound guided injection.  Large  joint effusion and significant degeneration seen at lateral joint line prior to injection.  Addendum: Patient provided x-rays obtained at PCP office on CD. Date of service was 12/17/2023.  Left knee x-ray. Tricompartmental DJD worse lateral compartment.  No acute fractures are visible. X-rays personally and independently interpreted.   Assessment and Plan: 78 y.o. female with chronic left knee pain due to degeneration especially the lateral joint line seen on ultrasound.  X-ray was done at primary care office but she forgot to bring the CD with her so I do not have access to the images at this time.  Plan for steroid injection and Voltaren gel and compression sleeve.  Check back as needed.   PDMP not reviewed this encounter. Orders Placed This Encounter  Procedures   US  LIMITED JOINT SPACE STRUCTURES LOW LEFT(NO LINKED CHARGES)    Reason for Exam (SYMPTOM  OR DIAGNOSIS REQUIRED):   left knee pain    Preferred imaging location?:   [facility]   No orders of the defined types were placed in this encounter.    Discussed warning signs or symptoms. Please see discharge instructions. Patient expresses understanding.   The above documentation has been reviewed and is accurate and complete Artist Lloyd, M.D.

## 2024-01-01 NOTE — Patient Instructions (Addendum)
 Thank you for coming in today.   Please use Voltaren gel (Generic Diclofenac Gel) up to 4x daily for pain as needed.  This is available over-the-counter as both the name brand Voltaren gel and the generic diclofenac gel.   If you could bring the x-ray CD by the office when you have time.   You received an injection today. Seek immediate medical attention if the joint becomes red, extremely painful, or is oozing fluid.   I recommend you obtained a compression sleeve to help with your joint problems. There are many options on the market however I recommend obtaining a Full Knee Body Helix compression sleeve.  You can find information (including how to appropriate measure yourself for sizing) can be found at [email or web address].  Many of these products are health savings account (HSA) eligible.   You can use the compression sleeve at any time throughout the day but is most important to use while being active as well as for 2 hours post-activity.   It is appropriate to ice following activity with the compression sleeve in place.   Check back as needed

## 2024-01-07 ENCOUNTER — Telehealth: Payer: Self-pay | Admitting: Family Medicine

## 2024-01-07 DIAGNOSIS — E559 Vitamin D deficiency, unspecified: Secondary | ICD-10-CM | POA: Diagnosis not present

## 2024-01-07 DIAGNOSIS — Z0000 Encounter for general adult medical examination without abnormal findings: Secondary | ICD-10-CM | POA: Diagnosis not present

## 2024-01-07 NOTE — Telephone Encounter (Signed)
L knee xray disc mailed back to patient.

## 2024-03-17 DIAGNOSIS — M25562 Pain in left knee: Secondary | ICD-10-CM | POA: Diagnosis not present

## 2024-03-25 DIAGNOSIS — M1712 Unilateral primary osteoarthritis, left knee: Secondary | ICD-10-CM | POA: Diagnosis not present

## 2024-03-25 DIAGNOSIS — M5432 Sciatica, left side: Secondary | ICD-10-CM | POA: Diagnosis not present

## 2024-04-15 DIAGNOSIS — M1712 Unilateral primary osteoarthritis, left knee: Secondary | ICD-10-CM | POA: Diagnosis not present

## 2024-04-22 ENCOUNTER — Other Ambulatory Visit: Payer: Self-pay | Admitting: Podiatry

## 2024-04-22 ENCOUNTER — Ambulatory Visit: Admitting: Podiatry

## 2024-04-22 DIAGNOSIS — L600 Ingrowing nail: Secondary | ICD-10-CM

## 2024-04-22 DIAGNOSIS — B351 Tinea unguium: Secondary | ICD-10-CM | POA: Diagnosis not present

## 2024-04-22 DIAGNOSIS — M1712 Unilateral primary osteoarthritis, left knee: Secondary | ICD-10-CM | POA: Diagnosis not present

## 2024-04-22 MED ORDER — TAVABOROLE 5 % EX SOLN: 1.0000 | Freq: Two times a day (BID) | CUTANEOUS | 6 refills | Status: AC

## 2024-04-22 NOTE — Progress Notes (Signed)
 Physical exam:  General appearance: Pleasant, and in no acute distress. AOx3.  Vascular: Pedal pulses: DP palpable bilaterally, PT palpable bilaterally.  No edema lower legs bilaterally. Capillary fill time immediate bilaterally.  Neurologica Light touch intact feet bilaterally.  Normal Achilles reflex bilaterally.  No clonus or spasticity noted.  No paresthesias.  No burning lower extremity bilaterally.  Dermatologic:   Nails dystrophic and discolored, with subungual debris, redness along nail folds 1-5 BL. Skin normal temperature bilaterally.  Tone, and texture bilaterally.  Incurvated ingrown medial nail border hallux right tenderness with pressure on this area  Musculoskeletal: Mild hammertoe deformities 2 through 5 bilaterally.  No tenderness with range of motion of joints.  Radiographs: None  Diagnosis:  1.  Ingrown medial nail border hallux nail right. 2.  Painful onychomycosis 1 through 5 bilaterally toenails  Plan: - New patient visit level 3 -Discussed the onychomycosis and treatments.  Discussed oral versus topical treatment for the onychomycosis.  Patient would like to do topical treatment.  Will start the patient on topical Kerydin. - Trim down the medial nail border of the hallux nail right.  If this continues to be a problem recommend matrixectomy of medial nail border hallux right - Discussed proper shoe gear.  Return in 4 weeks follow-up evaluation of hallux nail right

## 2024-04-26 DIAGNOSIS — H16293 Other keratoconjunctivitis, bilateral: Secondary | ICD-10-CM | POA: Diagnosis not present

## 2024-04-29 DIAGNOSIS — M1712 Unilateral primary osteoarthritis, left knee: Secondary | ICD-10-CM | POA: Diagnosis not present

## 2024-05-05 DIAGNOSIS — Z1231 Encounter for screening mammogram for malignant neoplasm of breast: Secondary | ICD-10-CM | POA: Diagnosis not present

## 2024-05-15 DIAGNOSIS — S0501XA Injury of conjunctiva and corneal abrasion without foreign body, right eye, initial encounter: Secondary | ICD-10-CM | POA: Diagnosis not present

## 2024-05-19 DIAGNOSIS — H40033 Anatomical narrow angle, bilateral: Secondary | ICD-10-CM | POA: Diagnosis not present

## 2024-05-19 DIAGNOSIS — H2513 Age-related nuclear cataract, bilateral: Secondary | ICD-10-CM | POA: Diagnosis not present

## 2024-06-21 ENCOUNTER — Ambulatory Visit: Admitting: Podiatry

## 2024-08-24 DIAGNOSIS — M1712 Unilateral primary osteoarthritis, left knee: Secondary | ICD-10-CM | POA: Diagnosis not present
